# Patient Record
Sex: Female | Born: 1950 | Hispanic: No | Marital: Married | Smoking: Former smoker
Health system: Southern US, Community
[De-identification: ages and names within clinical notes are randomized; demographics above are authoritative.]

## PROBLEM LIST (undated history)

## (undated) DIAGNOSIS — E119 Type 2 diabetes mellitus without complications: Secondary | ICD-10-CM

## (undated) DIAGNOSIS — G43909 Migraine, unspecified, not intractable, without status migrainosus: Secondary | ICD-10-CM

## (undated) DIAGNOSIS — M12811 Other specific arthropathies, not elsewhere classified, right shoulder: Secondary | ICD-10-CM

## (undated) DIAGNOSIS — Z87442 Personal history of urinary calculi: Secondary | ICD-10-CM

## (undated) DIAGNOSIS — T8859XA Other complications of anesthesia, initial encounter: Secondary | ICD-10-CM

## (undated) DIAGNOSIS — T7840XA Allergy, unspecified, initial encounter: Secondary | ICD-10-CM

## (undated) DIAGNOSIS — E785 Hyperlipidemia, unspecified: Secondary | ICD-10-CM

## (undated) DIAGNOSIS — E559 Vitamin D deficiency, unspecified: Secondary | ICD-10-CM

## (undated) DIAGNOSIS — I10 Essential (primary) hypertension: Secondary | ICD-10-CM

## (undated) DIAGNOSIS — F329 Major depressive disorder, single episode, unspecified: Secondary | ICD-10-CM

## (undated) DIAGNOSIS — K219 Gastro-esophageal reflux disease without esophagitis: Secondary | ICD-10-CM

## (undated) DIAGNOSIS — G629 Polyneuropathy, unspecified: Secondary | ICD-10-CM

## (undated) DIAGNOSIS — G4733 Obstructive sleep apnea (adult) (pediatric): Secondary | ICD-10-CM

## (undated) DIAGNOSIS — Z8489 Family history of other specified conditions: Secondary | ICD-10-CM

## (undated) DIAGNOSIS — I119 Hypertensive heart disease without heart failure: Secondary | ICD-10-CM

## (undated) DIAGNOSIS — I6523 Occlusion and stenosis of bilateral carotid arteries: Secondary | ICD-10-CM

## (undated) DIAGNOSIS — I639 Cerebral infarction, unspecified: Secondary | ICD-10-CM

## (undated) DIAGNOSIS — C801 Malignant (primary) neoplasm, unspecified: Secondary | ICD-10-CM

## (undated) DIAGNOSIS — F32A Depression, unspecified: Secondary | ICD-10-CM

## (undated) DIAGNOSIS — M1990 Unspecified osteoarthritis, unspecified site: Secondary | ICD-10-CM

## (undated) DIAGNOSIS — D751 Secondary polycythemia: Secondary | ICD-10-CM

## (undated) DIAGNOSIS — E1140 Type 2 diabetes mellitus with diabetic neuropathy, unspecified: Secondary | ICD-10-CM

## (undated) HISTORY — DX: Major depressive disorder, single episode, unspecified: F32.9

## (undated) HISTORY — DX: Hyperlipidemia, unspecified: E78.5

## (undated) HISTORY — PX: TUMOR REMOVAL: SHX12

## (undated) HISTORY — DX: Gastro-esophageal reflux disease without esophagitis: K21.9

## (undated) HISTORY — DX: Essential (primary) hypertension: I10

## (undated) HISTORY — DX: Allergy, unspecified, initial encounter: T78.40XA

## (undated) HISTORY — DX: Type 2 diabetes mellitus without complications: E11.9

## (undated) HISTORY — DX: Depression, unspecified: F32.A

## (undated) HISTORY — DX: Polyneuropathy, unspecified: G62.9

---
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
## 1984-09-19 HISTORY — PX: CYSTOSTOMY: SHX155

## 2000-02-23 HISTORY — PX: VAGINAL HYSTERECTOMY: SUR661

## 2000-04-05 ENCOUNTER — Encounter: Payer: Self-pay | Admitting: Neurosurgery

## 2000-04-05 ENCOUNTER — Ambulatory Visit (HOSPITAL_COMMUNITY): Admission: RE | Admit: 2000-04-05 | Discharge: 2000-04-05 | Payer: Self-pay | Admitting: Neurosurgery

## 2000-10-20 ENCOUNTER — Ambulatory Visit (HOSPITAL_COMMUNITY): Admission: RE | Admit: 2000-10-20 | Discharge: 2000-10-20 | Payer: Self-pay | Admitting: Neurosurgery

## 2000-10-20 ENCOUNTER — Encounter: Payer: Self-pay | Admitting: Neurosurgery

## 2000-12-05 ENCOUNTER — Encounter: Payer: Self-pay | Admitting: Neurosurgery

## 2000-12-07 HISTORY — PX: CERVICAL FUSION: SHX112

## 2000-12-08 ENCOUNTER — Encounter: Payer: Self-pay | Admitting: Neurosurgery

## 2000-12-09 ENCOUNTER — Inpatient Hospital Stay (HOSPITAL_COMMUNITY): Admission: RE | Admit: 2000-12-09 | Discharge: 2000-12-12 | Payer: Self-pay | Admitting: Neurosurgery

## 2000-12-28 ENCOUNTER — Ambulatory Visit (HOSPITAL_COMMUNITY): Admission: RE | Admit: 2000-12-28 | Discharge: 2000-12-28 | Payer: Self-pay | Admitting: Neurosurgery

## 2000-12-28 ENCOUNTER — Encounter: Payer: Self-pay | Admitting: Neurosurgery

## 2001-01-09 ENCOUNTER — Ambulatory Visit (HOSPITAL_COMMUNITY): Admission: RE | Admit: 2001-01-09 | Discharge: 2001-01-09 | Payer: Self-pay | Admitting: Neurosurgery

## 2001-01-09 ENCOUNTER — Encounter: Payer: Self-pay | Admitting: Neurosurgery

## 2001-03-22 HISTORY — PX: TONSILECTOMY, ADENOIDECTOMY, BILATERAL MYRINGOTOMY AND TUBES: SHX2538

## 2004-02-25 ENCOUNTER — Other Ambulatory Visit: Payer: Self-pay

## 2004-02-25 ENCOUNTER — Emergency Department: Payer: Self-pay | Admitting: Internal Medicine

## 2004-03-03 ENCOUNTER — Ambulatory Visit: Payer: Self-pay | Admitting: Internal Medicine

## 2004-03-22 HISTORY — PX: COLONOSCOPY: SHX5424

## 2006-02-15 ENCOUNTER — Ambulatory Visit: Payer: Self-pay | Admitting: Family Medicine

## 2006-03-01 ENCOUNTER — Ambulatory Visit: Payer: Self-pay | Admitting: Family Medicine

## 2006-09-19 ENCOUNTER — Ambulatory Visit: Payer: Self-pay | Admitting: Family Medicine

## 2006-11-15 ENCOUNTER — Ambulatory Visit: Payer: Self-pay | Admitting: General Practice

## 2006-11-16 ENCOUNTER — Ambulatory Visit: Payer: Self-pay

## 2006-11-21 HISTORY — PX: SHOULDER SURGERY: SHX246

## 2007-03-10 ENCOUNTER — Ambulatory Visit: Payer: Self-pay | Admitting: Family Medicine

## 2007-04-14 ENCOUNTER — Ambulatory Visit: Payer: Self-pay | Admitting: Internal Medicine

## 2007-04-14 ENCOUNTER — Emergency Department: Payer: Self-pay | Admitting: Emergency Medicine

## 2007-04-16 HISTORY — PX: SUBDURAL HEMATOMA EVACUATION VIA CRANIOTOMY: SUR319

## 2007-04-19 ENCOUNTER — Ambulatory Visit: Payer: Self-pay | Admitting: Family Medicine

## 2009-01-02 ENCOUNTER — Ambulatory Visit: Payer: Self-pay | Admitting: Family Medicine

## 2009-08-20 ENCOUNTER — Ambulatory Visit: Payer: Self-pay | Admitting: Family Medicine

## 2010-06-15 ENCOUNTER — Ambulatory Visit: Payer: Self-pay | Admitting: Family Medicine

## 2012-04-07 ENCOUNTER — Ambulatory Visit: Payer: Self-pay | Admitting: Family Medicine

## 2012-05-08 DIAGNOSIS — N23 Unspecified renal colic: Secondary | ICD-10-CM | POA: Insufficient documentation

## 2012-05-08 DIAGNOSIS — N3946 Mixed incontinence: Secondary | ICD-10-CM | POA: Insufficient documentation

## 2012-05-08 DIAGNOSIS — R310 Gross hematuria: Secondary | ICD-10-CM

## 2012-05-08 HISTORY — DX: Gross hematuria: R31.0

## 2012-05-15 ENCOUNTER — Ambulatory Visit: Payer: Self-pay | Admitting: Urology

## 2012-05-15 LAB — CREATININE, SERUM
Creatinine: 0.89 mg/dL (ref 0.60–1.30)
EGFR (African American): >60
EGFR (Non-African Amer.): >60

## 2012-09-11 DIAGNOSIS — N3941 Urge incontinence: Secondary | ICD-10-CM | POA: Insufficient documentation

## 2012-09-11 DIAGNOSIS — N281 Cyst of kidney, acquired: Secondary | ICD-10-CM | POA: Insufficient documentation

## 2012-09-11 DIAGNOSIS — N393 Stress incontinence (female) (male): Secondary | ICD-10-CM | POA: Insufficient documentation

## 2013-08-09 DIAGNOSIS — K219 Gastro-esophageal reflux disease without esophagitis: Secondary | ICD-10-CM | POA: Insufficient documentation

## 2013-08-09 DIAGNOSIS — E119 Type 2 diabetes mellitus without complications: Secondary | ICD-10-CM | POA: Insufficient documentation

## 2013-08-09 DIAGNOSIS — E785 Hyperlipidemia, unspecified: Secondary | ICD-10-CM | POA: Insufficient documentation

## 2013-08-09 DIAGNOSIS — I10 Essential (primary) hypertension: Secondary | ICD-10-CM | POA: Insufficient documentation

## 2014-02-05 DIAGNOSIS — R339 Retention of urine, unspecified: Secondary | ICD-10-CM | POA: Insufficient documentation

## 2014-12-16 ENCOUNTER — Encounter: Payer: Self-pay | Admitting: Family Medicine

## 2014-12-16 ENCOUNTER — Ambulatory Visit (INDEPENDENT_AMBULATORY_CARE_PROVIDER_SITE_OTHER): Payer: Medicare Other | Admitting: Family Medicine

## 2014-12-16 VITALS — BP 120/80 | HR 80 | Ht 65.0 in | Wt 225.0 lb

## 2014-12-16 DIAGNOSIS — E785 Hyperlipidemia, unspecified: Secondary | ICD-10-CM | POA: Diagnosis not present

## 2014-12-16 DIAGNOSIS — R69 Illness, unspecified: Secondary | ICD-10-CM

## 2014-12-16 DIAGNOSIS — K219 Gastro-esophageal reflux disease without esophagitis: Secondary | ICD-10-CM

## 2014-12-16 MED ORDER — ESOMEPRAZOLE MAGNESIUM 40 MG PO CPDR: 40.0000 mg | DELAYED_RELEASE_CAPSULE | Freq: Every day | ORAL | Status: DC

## 2014-12-16 MED ORDER — ROSUVASTATIN CALCIUM 20 MG PO TABS: 20.0000 mg | ORAL_TABLET | Freq: Every day | ORAL | Status: DC

## 2014-12-16 NOTE — Progress Notes (Signed)
Name: Tracey Russo   MRN: [id number]    DOB: [birth year]   Date:12/16/2014       Progress Note  Subjective  Chief Complaint  Chief Complaint  Patient presents with  . Hyperlipidemia  . Gastrophageal Reflux    Hyperlipidemia This is a recurrent problem. The current episode started more than 1 year ago. The problem is controlled. Recent lipid tests were reviewed and are low. She has no history of chronic renal disease, diabetes, hypothyroidism, liver disease, obesity or nephrotic syndrome. Factors aggravating her hyperlipidemia include beta blockers and smoking. Pertinent negatives include no chest pain, focal sensory loss, focal weakness, leg pain, myalgias or shortness of breath. Current antihyperlipidemic treatment includes statins. The current treatment provides mild improvement of lipids. There are no compliance problems.  Risk factors for coronary artery disease include dyslipidemia, hypertension, stress, obesity and post-menopausal.  Gastrophageal Reflux She reports no abdominal pain, no belching, no chest pain, no choking, no coughing, no dysphagia, no heartburn, no hoarse voice, no nausea, no sore throat or no wheezing. This is a chronic problem. The current episode started more than 1 year ago. The problem occurs frequently. The problem has been gradually improving. Pertinent negatives include no anemia, fatigue, melena, muscle weakness, orthopnea or weight loss. She has tried a PPI for the symptoms. The treatment provided no relief. Past procedures do not include an abdominal ultrasound, an EGD or a UGI.    No problem-specific assessment & plan notes found for this encounter.   Past Medical History  Diagnosis Date  . Diabetes mellitus without complication   . Depression   . Neuropathy   . Hypertension   . GERD (gastroesophageal reflux disease)   . Allergy     Past Surgical History  Procedure Laterality Date  . Cystostomy  09/1984  . Vaginal hysterectomy  02/23/2000  .  Cervical fusion  12/07/2000  . Tonsilectomy, adenoidectomy, bilateral myringotomy and tubes  03/2001  . Shoulder surgery Right 11/2006    tumor removed  . Subdural hematoma evacuation via craniotomy  04/16/2007  . Colonoscopy  2006    Dr [name]    History reviewed. No pertinent family history.  Social History   Social History  . Marital Status: Married    Spouse Name: N/A  . Number of Children: N/A  . Years of Education: N/A   Occupational History  . Not on file.   Social History Main Topics  . Smoking status: Current Every Day Smoker  . Smokeless tobacco: Not on file  . Alcohol Use: 0.0 oz/week    0 Standard drinks or equivalent per week  . Drug Use: No  . Sexual Activity: Yes   Other Topics Concern  . Not on file   Social History Narrative  . No narrative on file    Allergies  Allergen Reactions  . Ace Inhibitors Cough  . Corticosteroids     Other reaction(s): OTHER  . Nsaids     Other reaction(s): ANAPHYLAXIS     Review of Systems  Constitutional: Negative for fever, chills, weight loss, malaise/fatigue and fatigue.  HENT: Negative for ear discharge, ear pain, hoarse voice and sore throat.   Eyes: Negative for blurred vision.  Respiratory: Negative for cough, sputum production, choking, shortness of breath and wheezing.   Cardiovascular: Negative for chest pain, palpitations and leg swelling.  Gastrointestinal: Negative for heartburn, dysphagia, nausea, abdominal pain, diarrhea, constipation, blood in stool and melena.  Genitourinary: Negative for dysuria, urgency, frequency and hematuria.  Musculoskeletal: Negative  for myalgias, back pain, joint pain, muscle weakness and neck pain.  Skin: Negative for rash.  Neurological: Negative for dizziness, tingling, sensory change, focal weakness and headaches.  Endo/Heme/Allergies: Negative for environmental allergies and polydipsia. Does not bruise/bleed easily.  Psychiatric/Behavioral: Negative for depression  and suicidal ideas. The patient is not nervous/anxious and does not have insomnia.      Objective  Filed Vitals:   12/16/14 1337  BP: 120/80  Pulse: 80  Height: 5\' 5"  (1.651 m)  Weight: 225 lb (102.059 kg)    Physical Exam  Constitutional: She is well-developed, well-nourished, and in no distress. No distress.  HENT:  Head: Normocephalic and atraumatic.  Right Ear: External ear normal.  Left Ear: External ear normal.  Nose: Nose normal.  Mouth/Throat: Oropharynx is clear and moist.  Eyes: Conjunctivae and EOM are normal. Pupils are equal, round, and reactive to light. Right eye exhibits no discharge. Left eye exhibits no discharge.  Neck: Normal range of motion. Neck supple. No JVD present. No thyromegaly present.  Cardiovascular: Normal rate, regular rhythm, normal heart sounds and intact distal pulses.  Exam reveals no gallop and no friction rub.   No murmur heard. Pulmonary/Chest: Effort normal and breath sounds normal.  Abdominal: Soft. Bowel sounds are normal. She exhibits no mass. There is no tenderness. There is no guarding.  Musculoskeletal: Normal range of motion. She exhibits no edema.  Lymphadenopathy:    She has no cervical adenopathy.  Neurological: She is alert. She has normal reflexes.  Skin: Skin is warm and dry. She is not diaphoretic.  Psychiatric: Mood and affect normal.      Assessment & Plan  Problem List Items Addressed This Visit      Digestive   Acid reflux - Primary   Relevant Medications   esomeprazole (NEXIUM) 40 MG capsule     Other   Dyslipidemia   Relevant Medications   rosuvastatin (CRESTOR) 20 MG tablet    Other Visit Diagnoses    Taking multiple medications for chronic disease        Relevant Orders    EKG 12-Lead (Completed)         Dr. [facility]  12/16/2014

## 2014-12-19 ENCOUNTER — Other Ambulatory Visit: Payer: Self-pay | Admitting: Family Medicine

## 2014-12-19 DIAGNOSIS — J302 Other seasonal allergic rhinitis: Secondary | ICD-10-CM

## 2015-01-22 ENCOUNTER — Other Ambulatory Visit: Payer: Self-pay | Admitting: Family Medicine

## 2015-02-05 ENCOUNTER — Other Ambulatory Visit: Payer: Self-pay

## 2015-02-05 ENCOUNTER — Other Ambulatory Visit: Payer: Self-pay | Admitting: Family Medicine

## 2015-02-07 ENCOUNTER — Other Ambulatory Visit: Payer: Self-pay

## 2015-02-20 ENCOUNTER — Other Ambulatory Visit: Payer: Self-pay | Admitting: Family Medicine

## 2015-03-13 LAB — MICROALBUMIN, URINE: Microalb, Ur: 187

## 2015-04-11 ENCOUNTER — Other Ambulatory Visit: Payer: Self-pay | Admitting: Family Medicine

## 2015-05-05 ENCOUNTER — Ambulatory Visit (INDEPENDENT_AMBULATORY_CARE_PROVIDER_SITE_OTHER): Payer: Medicare Other | Admitting: Family Medicine

## 2015-05-05 ENCOUNTER — Encounter: Payer: Self-pay | Admitting: Family Medicine

## 2015-05-05 VITALS — BP 120/80 | HR 80 | Ht 65.0 in | Wt 222.0 lb

## 2015-05-05 DIAGNOSIS — B3749 Other urogenital candidiasis: Secondary | ICD-10-CM

## 2015-05-05 DIAGNOSIS — B958 Unspecified staphylococcus as the cause of diseases classified elsewhere: Secondary | ICD-10-CM | POA: Diagnosis not present

## 2015-05-05 DIAGNOSIS — L089 Local infection of the skin and subcutaneous tissue, unspecified: Secondary | ICD-10-CM | POA: Diagnosis not present

## 2015-05-05 MED ORDER — SULFAMETHOXAZOLE-TRIMETHOPRIM 800-160 MG PO TABS: 1.0000 | ORAL_TABLET | Freq: Two times a day (BID) | ORAL | Status: DC

## 2015-05-05 MED ORDER — FLUCONAZOLE 150 MG PO TABS: 150.0000 mg | ORAL_TABLET | Freq: Once | ORAL | Status: DC

## 2015-05-05 MED ORDER — MUPIROCIN CALCIUM 2 % EX CREA: 1.0000 "application " | TOPICAL_CREAM | Freq: Two times a day (BID) | CUTANEOUS | Status: DC

## 2015-05-05 NOTE — Progress Notes (Signed)
Name: Tracey Russo   MRN: [id number]    DOB: [birth year]   Date:05/05/2015       Progress Note  Subjective  Chief Complaint  Chief Complaint  Patient presents with  . Rash    fell on abdomen- red, irritated, oozing    Rash This is a new problem. The current episode started in the past 7 days. The problem has been gradually worsening since onset. The affected locations include the torso. Pertinent negatives include no anorexia, congestion, cough, diarrhea, eye pain, facial edema, fatigue, fever, joint pain, nail changes, rhinorrhea, shortness of breath, sore throat or vomiting.    No problem-specific assessment & plan notes found for this encounter.   Past Medical History  Diagnosis Date  . Diabetes mellitus without complication ([place])   . Depression   . Neuropathy ([place])   . Hypertension   . GERD (gastroesophageal reflux disease)   . Allergy     Past Surgical History  Procedure Laterality Date  . Cystostomy  09/1984  . Vaginal hysterectomy  02/23/2000  . Cervical fusion  12/07/2000  . Tonsilectomy, adenoidectomy, bilateral myringotomy and tubes  03/2001  . Shoulder surgery Right 11/2006    tumor removed  . Subdural hematoma evacuation via craniotomy  04/16/2007  . Colonoscopy  2006    Dr [name]    History reviewed. No pertinent family history.  Social History   Social History  . Marital Status: Married    Spouse Name: N/A  . Number of Children: N/A  . Years of Education: N/A   Occupational History  . Not on file.   Social History Main Topics  . Smoking status: Current Every Day Smoker  . Smokeless tobacco: Not on file  . Alcohol Use: 0.0 oz/week    0 Standard drinks or equivalent per week  . Drug Use: No  . Sexual Activity: Yes   Other Topics Concern  . Not on file   Social History Narrative    Allergies  Allergen Reactions  . Ace Inhibitors Cough  . Corticosteroids     Other reaction(s): OTHER  . Nsaids     Other reaction(s): ANAPHYLAXIS      Review of Systems  Constitutional: Negative for fever, chills, weight loss, malaise/fatigue and fatigue.  HENT: Negative for congestion, ear discharge, ear pain, rhinorrhea and sore throat.   Eyes: Negative for blurred vision and pain.  Respiratory: Negative for cough, sputum production, shortness of breath and wheezing.   Cardiovascular: Negative for chest pain, palpitations and leg swelling.  Gastrointestinal: Negative for heartburn, nausea, vomiting, abdominal pain, diarrhea, constipation, blood in stool, melena and anorexia.  Genitourinary: Negative for dysuria, urgency, frequency and hematuria.  Musculoskeletal: Negative for myalgias, back pain, joint pain and neck pain.  Skin: Positive for rash. Negative for nail changes.  Neurological: Negative for dizziness, tingling, sensory change, focal weakness and headaches.  Endo/Heme/Allergies: Negative for environmental allergies and polydipsia. Does not bruise/bleed easily.  Psychiatric/Behavioral: Negative for depression and suicidal ideas. The patient is not nervous/anxious and does not have insomnia.      Objective  Filed Vitals:   05/05/15 1604  BP: 120/80  Pulse: 80  Height: 5\' 5"  (1.651 m)  Weight: 222 lb (100.699 kg)    Physical Exam  Constitutional: She is well-developed, well-nourished, and in no distress. No distress.  HENT:  Head: Normocephalic and atraumatic.  Right Ear: External ear normal.  Left Ear: External ear normal.  Nose: Nose normal.  Mouth/Throat: Oropharynx is clear and moist.  Eyes: Conjunctivae and EOM are normal. Pupils are equal, round, and reactive to light. Right eye exhibits no discharge. Left eye exhibits no discharge.  Neck: Normal range of motion. Neck supple. No JVD present. No thyromegaly present.  Cardiovascular: Normal rate, regular rhythm, normal heart sounds and intact distal pulses.  Exam reveals no gallop and no friction rub.   No murmur heard. Pulmonary/Chest: Effort normal and  breath sounds normal.  Abdominal: Soft. Bowel sounds are normal. She exhibits no mass. There is no tenderness. There is no guarding.  Musculoskeletal: Normal range of motion. She exhibits no edema.  Lymphadenopathy:    She has no cervical adenopathy.  Neurological: She is alert. She has normal reflexes.  Skin: Skin is warm and dry. Rash noted. She is not diaphoretic. There is erythema.  Psychiatric: Mood and affect normal.  Nursing note and vitals reviewed.     Assessment & Plan  Problem List Items Addressed This Visit    None    Visit Diagnoses    Staph skin infection    -  Primary    Relevant Medications    sulfamethoxazole-trimethoprim (BACTRIM DS,SEPTRA DS) 800-160 MG tablet    mupirocin cream (BACTROBAN) 2 %    fluconazole (DIFLUCAN) 150 MG tablet    Candidiasis of perineum        Relevant Medications    sulfamethoxazole-trimethoprim (BACTRIM DS,SEPTRA DS) 800-160 MG tablet    mupirocin cream (BACTROBAN) 2 %    fluconazole (DIFLUCAN) 150 MG tablet         Dr. Chaniyah Jahr [facility]  05/05/2015

## 2015-05-05 NOTE — Patient Instructions (Signed)
Wound Care °Taking care of your wound properly can help to prevent pain and infection. It can also help your wound to heal more quickly.  °HOW TO CARE FOR YOUR WOUND  °· Take or apply over-the-counter and prescription medicines only as told by your health care provider. °· If you were prescribed antibiotic medicine, take or apply it as told by your health care provider. Do not stop using the antibiotic even if your condition improves. °· Clean the wound each day or as told by your health care provider. °¨ Wash the wound with mild soap and water. °¨ Rinse the wound with water to remove all soap. °¨ Pat the wound dry with a clean towel. Do not rub it. °· There are many different ways to close and cover a wound. For example, a wound can be covered with stitches (sutures), skin glue, or adhesive strips. Follow instructions from your health care provider about: °¨ How to take care of your wound. °¨ When and how you should change your bandage (dressing). °¨ When you should remove your dressing. °¨ Removing whatever was used to close your wound. °· Check your wound every day for signs of infection. Watch for: °¨ Redness, swelling, or pain. °¨ Fluid, blood, or pus. °· Keep the dressing dry until your health care provider says it can be removed. Do not take baths, swim, use a hot tub, or do anything that would put your wound underwater until your health care provider approves. °· Raise (elevate) the injured area above the level of your heart while you are sitting or lying down. °· Do not scratch or pick at the wound. °· Keep all follow-up visits as told by your health care provider. This is important. °SEEK MEDICAL CARE IF: °· You received a tetanus shot and you have swelling, severe pain, redness, or bleeding at the injection site. °· You have a fever. °· Your pain is not controlled with medicine. °· You have increased redness, swelling, or pain at the site of your wound. °· You have fluid, blood, or pus coming from your  wound. °· You notice a bad smell coming from your wound or your dressing. °SEEK IMMEDIATE MEDICAL CARE IF: °· You have a red streak going away from your wound. °  °This information is not intended to replace advice given to you by your health care provider. Make sure you discuss any questions you have with your health care provider. °  °Document Released: 12/16/2007 Document Revised: 07/23/2014 Document Reviewed: 03/04/2014 °Elsevier Interactive Patient Education ©2016 [employer] ° °

## 2015-05-09 ENCOUNTER — Ambulatory Visit (INDEPENDENT_AMBULATORY_CARE_PROVIDER_SITE_OTHER): Payer: Medicare Other | Admitting: Family Medicine

## 2015-05-09 ENCOUNTER — Encounter: Payer: Self-pay | Admitting: Family Medicine

## 2015-05-09 VITALS — BP 120/70 | HR 68 | Ht 65.0 in | Wt 224.0 lb

## 2015-05-09 DIAGNOSIS — L03311 Cellulitis of abdominal wall: Secondary | ICD-10-CM | POA: Diagnosis not present

## 2015-05-09 DIAGNOSIS — B354 Tinea corporis: Secondary | ICD-10-CM

## 2015-05-09 DIAGNOSIS — Z23 Encounter for immunization: Secondary | ICD-10-CM | POA: Diagnosis not present

## 2015-05-09 NOTE — Progress Notes (Signed)
Name: Tracey Russo   MRN: [id number]    DOB: [birth year]   Date:05/09/2015       Progress Note  Subjective  Chief Complaint  Chief Complaint  Patient presents with  . Rash    "seems to have improved"    Rash This is a new problem. The current episode started in the past 7 days. The problem has been resolved since onset. The rash is characterized by itchiness. She was exposed to nothing. Pertinent negatives include no anorexia, congestion, cough, diarrhea, eye pain, facial edema, fatigue, fever, joint pain, nail changes, rhinorrhea, shortness of breath, sore throat or vomiting. Past treatments include nothing. The treatment provided mild relief.    No problem-specific assessment & plan notes found for this encounter.   Past Medical History  Diagnosis Date  . Diabetes mellitus without complication ([place])   . Depression   . Neuropathy ([place])   . Hypertension   . GERD (gastroesophageal reflux disease)   . Allergy     Past Surgical History  Procedure Laterality Date  . Cystostomy  09/1984  . Vaginal hysterectomy  02/23/2000  . Cervical fusion  12/07/2000  . Tonsilectomy, adenoidectomy, bilateral myringotomy and tubes  03/2001  . Shoulder surgery Right 11/2006    tumor removed  . Subdural hematoma evacuation via craniotomy  04/16/2007  . Colonoscopy  2006    Dr [name]    History reviewed. No pertinent family history.  Social History   Social History  . Marital Status: Married    Spouse Name: N/A  . Number of Children: N/A  . Years of Education: N/A   Occupational History  . Not on file.   Social History Main Topics  . Smoking status: Current Every Day Smoker  . Smokeless tobacco: Not on file  . Alcohol Use: 0.0 oz/week    0 Standard drinks or equivalent per week  . Drug Use: No  . Sexual Activity: Yes   Other Topics Concern  . Not on file   Social History Narrative    Allergies  Allergen Reactions  . Ace Inhibitors Cough  . Corticosteroids     Other  reaction(s): OTHER  . Nsaids     Other reaction(s): ANAPHYLAXIS     Review of Systems  Constitutional: Negative for fever, chills, weight loss, malaise/fatigue and fatigue.  HENT: Negative for congestion, ear discharge, ear pain, rhinorrhea and sore throat.   Eyes: Negative for blurred vision and pain.  Respiratory: Negative for cough, sputum production, shortness of breath and wheezing.   Cardiovascular: Negative for chest pain, palpitations and leg swelling.  Gastrointestinal: Negative for heartburn, nausea, vomiting, abdominal pain, diarrhea, constipation, blood in stool, melena and anorexia.  Genitourinary: Negative for dysuria, urgency, frequency and hematuria.  Musculoskeletal: Negative for myalgias, back pain, joint pain and neck pain.  Skin: Positive for rash. Negative for nail changes.  Neurological: Negative for dizziness, tingling, sensory change, focal weakness and headaches.  Endo/Heme/Allergies: Negative for environmental allergies and polydipsia. Does not bruise/bleed easily.  Psychiatric/Behavioral: Negative for depression and suicidal ideas. The patient is not nervous/anxious and does not have insomnia.      Objective  Filed Vitals:   05/09/15 1414  BP: 120/70  Pulse: 68  Height: 5\' 5"  (1.651 m)  Weight: 224 lb (101.606 kg)    Physical Exam  Skin: No abrasion and no rash noted. No erythema.  Nursing note and vitals reviewed.     Assessment & Plan  Problem List Items Addressed This Visit  None    Visit Diagnoses    Tinea corporis    -  Primary    Cellulitis of abdominal wall        resolved    Need for influenza vaccination        Relevant Orders    Flu Vaccine QUAD 36+ mos PF IM (Fluarix & Fluzone Quad PF) (Completed)         Dr. [facility]  05/09/2015

## 2015-05-21 LAB — HEMOGLOBIN A1C: Hemoglobin A1C: 7.8

## 2015-06-16 ENCOUNTER — Ambulatory Visit (INDEPENDENT_AMBULATORY_CARE_PROVIDER_SITE_OTHER): Payer: Medicare Other | Admitting: Family Medicine

## 2015-06-16 ENCOUNTER — Encounter: Payer: Self-pay | Admitting: Family Medicine

## 2015-06-16 VITALS — BP 118/80 | HR 68 | Ht 65.0 in | Wt 224.0 lb

## 2015-06-16 DIAGNOSIS — N3941 Urge incontinence: Secondary | ICD-10-CM

## 2015-06-16 DIAGNOSIS — E785 Hyperlipidemia, unspecified: Secondary | ICD-10-CM | POA: Diagnosis not present

## 2015-06-16 DIAGNOSIS — I679 Cerebrovascular disease, unspecified: Secondary | ICD-10-CM | POA: Diagnosis not present

## 2015-06-16 DIAGNOSIS — K219 Gastro-esophageal reflux disease without esophagitis: Secondary | ICD-10-CM

## 2015-06-16 DIAGNOSIS — I10 Essential (primary) hypertension: Secondary | ICD-10-CM

## 2015-06-16 MED ORDER — METOPROLOL SUCCINATE ER 50 MG PO TB24: 50.0000 mg | ORAL_TABLET | Freq: Every day | ORAL | Status: DC

## 2015-06-16 MED ORDER — ESOMEPRAZOLE MAGNESIUM 40 MG PO CPDR: 40.0000 mg | DELAYED_RELEASE_CAPSULE | Freq: Every day | ORAL | Status: DC

## 2015-06-16 MED ORDER — CLOPIDOGREL BISULFATE 75 MG PO TABS: 75.0000 mg | ORAL_TABLET | Freq: Every day | ORAL | Status: DC

## 2015-06-16 MED ORDER — AMLODIPINE BESYLATE 10 MG PO TABS: 10.0000 mg | ORAL_TABLET | Freq: Every day | ORAL | Status: DC

## 2015-06-16 MED ORDER — HYDROCHLOROTHIAZIDE 12.5 MG PO TABS: 12.5000 mg | ORAL_TABLET | Freq: Every day | ORAL | Status: DC

## 2015-06-16 MED ORDER — ROSUVASTATIN CALCIUM 20 MG PO TABS: 20.0000 mg | ORAL_TABLET | Freq: Every day | ORAL | Status: DC

## 2015-06-16 NOTE — Progress Notes (Signed)
Name: Tracey Russo   MRN: [id number]    DOB: 10-11-1950   Date:06/16/2015       Progress Note  Subjective  Chief Complaint  Chief Complaint  Patient presents with  . Hypertension  . Gastroesophageal Reflux  . Cerebrovascular Accident  . Allergic Rhinitis   . Hyperlipidemia    HPI Comments: Patient with polycythemia vera.  Hypertension This is a chronic problem. The current episode started more than 1 year ago. The problem has been gradually improving since onset. The problem is controlled. Pertinent negatives include no blurred vision, chest pain, headaches, malaise/fatigue, neck pain, palpitations or shortness of breath. There are no associated agents to hypertension. Risk factors for coronary artery disease include diabetes mellitus, dyslipidemia and smoking/tobacco exposure. Past treatments include beta blockers, calcium channel blockers and diuretics. Hypertensive end-organ damage includes CVA. There is no history of angina, kidney disease, CAD/MI, heart failure, left ventricular hypertrophy, PVD, renovascular disease or retinopathy. There is no history of chronic renal disease or a hypertension causing med.  Gastroesophageal Reflux She reports no abdominal pain, no chest pain, no coughing, no heartburn, no nausea, no sore throat or no wheezing. This is a new problem. The current episode started in the past 7 days. The problem has been waxing and waning (must take ppi). Pertinent negatives include no melena or weight loss. There are no known risk factors. She has tried a PPI for the symptoms. The treatment provided significant relief.  Cerebrovascular Accident This is a chronic problem. The current episode started more than 1 year ago. The problem has been waxing and waning. Pertinent negatives include no abdominal pain, chest pain, chills, coughing, fever, headaches, myalgias, nausea, neck pain, rash, sore throat, vertigo or visual change. She has tried acetaminophen and NSAIDs for the  symptoms. The treatment provided mild relief.  Hyperlipidemia This is a chronic problem. The current episode started more than 1 year ago. The problem is controlled. She has no history of chronic renal disease. Pertinent negatives include no chest pain, focal weakness, myalgias or shortness of breath. Current antihyperlipidemic treatment includes statins. The current treatment provides moderate improvement of lipids. There are no compliance problems.     No problem-specific assessment & plan notes found for this encounter.   Past Medical History  Diagnosis Date  . Diabetes mellitus without complication ([place])   . Depression   . Neuropathy ([place])   . Hypertension   . GERD (gastroesophageal reflux disease)   . Allergy     Past Surgical History  Procedure Laterality Date  . Cystostomy  09/1984  . Vaginal hysterectomy  02/23/2000  . Cervical fusion  12/07/2000  . Tonsilectomy, adenoidectomy, bilateral myringotomy and tubes  03/2001  . Shoulder surgery Right 11/2006    tumor removed  . Subdural hematoma evacuation via craniotomy  04/16/2007  . Colonoscopy  2006    Dr [name]    History reviewed. No pertinent family history.  Social History   Social History  . Marital Status: Married    Spouse Name: N/A  . Number of Children: N/A  . Years of Education: N/A   Occupational History  . Not on file.   Social History Main Topics  . Smoking status: Current Every Day Smoker  . Smokeless tobacco: Not on file  . Alcohol Use: 0.0 oz/week    0 Standard drinks or equivalent per week  . Drug Use: No  . Sexual Activity: Yes   Other Topics Concern  . Not on file   Social  History Narrative    Allergies  Allergen Reactions  . Ace Inhibitors Cough  . Corticosteroids     Other reaction(s): OTHER  . Nsaids     Other reaction(s): ANAPHYLAXIS     Review of Systems  Constitutional: Negative for fever, chills, weight loss and malaise/fatigue.  HENT: Negative for ear discharge,  ear pain and sore throat.   Eyes: Negative for blurred vision.  Respiratory: Negative for cough, sputum production, shortness of breath and wheezing.   Cardiovascular: Negative for chest pain, palpitations and leg swelling.  Gastrointestinal: Negative for heartburn, nausea, abdominal pain, diarrhea, constipation, blood in stool and melena.  Genitourinary: Negative for dysuria, urgency, frequency and hematuria.  Musculoskeletal: Negative for myalgias, back pain, joint pain and neck pain.  Skin: Negative for rash.  Neurological: Negative for dizziness, vertigo, tingling, sensory change, focal weakness and headaches.  Endo/Heme/Allergies: Negative for environmental allergies and polydipsia. Does not bruise/bleed easily.  Psychiatric/Behavioral: Negative for depression and suicidal ideas. The patient is not nervous/anxious and does not have insomnia.      Objective  Filed Vitals:   06/16/15 1408  BP: 118/80  Pulse: 68  Height: 5\' 5"  (1.651 m)  Weight: 224 lb (101.606 kg)    Physical Exam  Constitutional: She is well-developed, well-nourished, and in no distress. No distress.  HENT:  Head: Normocephalic and atraumatic.  Right Ear: External ear normal.  Left Ear: External ear normal.  Nose: Nose normal.  Mouth/Throat: Oropharynx is clear and moist.  Eyes: Conjunctivae and EOM are normal. Pupils are equal, round, and reactive to light. Right eye exhibits no discharge. Left eye exhibits no discharge.  Neck: Normal range of motion. Neck supple. No JVD present. No thyromegaly present.  Cardiovascular: Normal rate, regular rhythm, normal heart sounds and intact distal pulses.  Exam reveals no gallop and no friction rub.   No murmur heard. Pulmonary/Chest: Effort normal and breath sounds normal.  Abdominal: Soft. Bowel sounds are normal. She exhibits no mass. There is no tenderness. There is no guarding.  Musculoskeletal: Normal range of motion. She exhibits no edema.  Lymphadenopathy:     She has no cervical adenopathy.  Neurological: She is alert. She has normal reflexes.  Skin: Skin is warm and dry. She is not diaphoretic.  Psychiatric: Mood and affect normal.  Nursing note and vitals reviewed.     Assessment & Plan  Problem List Items Addressed This Visit      Cardiovascular and Mediastinum   BP (high blood pressure) - Primary   Relevant Medications   amLODipine (NORVASC) 10 MG tablet   hydrochlorothiazide (HYDRODIURIL) 12.5 MG tablet   metoprolol succinate (TOPROL-XL) 50 MG 24 hr tablet   rosuvastatin (CRESTOR) 20 MG tablet   Other Relevant Orders   Renal Function Panel     Digestive   Acid reflux   Relevant Medications   esomeprazole (NEXIUM) 40 MG capsule     Other   Dyslipidemia   Relevant Medications   rosuvastatin (CRESTOR) 20 MG tablet   Other Relevant Orders   Lipid Profile   Urge incontinence    Other Visit Diagnoses    Cerebrovascular disease        Relevant Medications    amLODipine (NORVASC) 10 MG tablet    clopidogrel (PLAVIX) 75 MG tablet    hydrochlorothiazide (HYDRODIURIL) 12.5 MG tablet    metoprolol succinate (TOPROL-XL) 50 MG 24 hr tablet    rosuvastatin (CRESTOR) 20 MG tablet         Dr. Tilda Burrow  [facility]  06/16/2015

## 2015-06-17 LAB — RENAL FUNCTION PANEL
ALBUMIN: 4.3 g/dL (ref 3.6–4.8)
BUN/Creatinine Ratio: 17 (ref 11–26)
BUN: 11 mg/dL (ref 8–27)
CALCIUM: 9.8 mg/dL (ref 8.7–10.3)
CHLORIDE: 98 mmol/L (ref 96–106)
CO2: 26 mmol/L (ref 18–29)
Creatinine, Ser: 0.64 mg/dL (ref 0.57–1.00)
GFR calc Af Amer: 108 mL/min/{1.73_m2} (ref >59)
GFR calc non Af Amer: 93 mL/min/{1.73_m2} (ref >59)
Glucose: 168 mg/dL — ABNORMAL HIGH (ref 65–99)
PHOSPHORUS: 3.3 mg/dL (ref 2.5–4.5)
POTASSIUM: 4.3 mmol/L (ref 3.5–5.2)
SODIUM: 141 mmol/L (ref 134–144)

## 2015-06-17 LAB — LIPID PANEL
CHOLESTEROL TOTAL: 133 mg/dL (ref 100–199)
Chol/HDL Ratio: 3.7 ratio units (ref 0.0–4.4)
HDL: 36 mg/dL — AB (ref >39)
LDL Calculated: 59 mg/dL (ref 0–99)
Triglycerides: 190 mg/dL — ABNORMAL HIGH (ref 0–149)
VLDL CHOLESTEROL CAL: 38 mg/dL (ref 5–40)

## 2015-07-07 ENCOUNTER — Other Ambulatory Visit: Payer: Self-pay | Admitting: Family Medicine

## 2015-07-28 ENCOUNTER — Ambulatory Visit (INDEPENDENT_AMBULATORY_CARE_PROVIDER_SITE_OTHER): Payer: Medicare Other | Admitting: Family Medicine

## 2015-07-28 ENCOUNTER — Encounter: Payer: Self-pay | Admitting: Family Medicine

## 2015-07-28 VITALS — BP 120/80 | HR 60 | Ht 65.0 in | Wt 220.0 lb

## 2015-07-28 DIAGNOSIS — J0100 Acute maxillary sinusitis, unspecified: Secondary | ICD-10-CM

## 2015-07-28 MED ORDER — AMOXICILLIN 500 MG PO CAPS: 500.0000 mg | ORAL_CAPSULE | Freq: Three times a day (TID) | ORAL | Status: DC

## 2015-07-28 NOTE — Progress Notes (Signed)
Name: Tracey Russo   MRN: [id number]    DOB: Jul 11, 1950   Date:07/28/2015       Progress Note  Subjective  Chief Complaint  Chief Complaint  Patient presents with  . Allergic Rhinitis     watery eyes, runny nose, cough, drainage    Sinusitis This is a new problem. The current episode started in the past 7 days. The problem has been waxing and waning since onset. There has been no fever. Associated symptoms include chills, congestion, coughing, ear pain, headaches, shortness of breath, sinus pressure and a sore throat. Pertinent negatives include no diaphoresis, hoarse voice, neck pain, sneezing or swollen glands. Past treatments include acetaminophen. The treatment provided mild relief.  Cough This is a new problem. The problem has been waxing and waning. The cough is non-productive. Associated symptoms include chills, ear pain, headaches, nasal congestion, postnasal drip, rhinorrhea, a sore throat and shortness of breath. Pertinent negatives include no chest pain, fever, heartburn, hemoptysis, myalgias, rash, sweats, weight loss or wheezing. She has tried nothing for the symptoms. The treatment provided mild relief. There is no history of environmental allergies.    No problem-specific assessment & plan notes found for this encounter.   Past Medical History  Diagnosis Date  . Diabetes mellitus without complication ([place])   . Depression   . Neuropathy ([place])   . Hypertension   . GERD (gastroesophageal reflux disease)   . Allergy     Past Surgical History  Procedure Laterality Date  . Cystostomy  09/1984  . Vaginal hysterectomy  02/23/2000  . Cervical fusion  12/07/2000  . Tonsilectomy, adenoidectomy, bilateral myringotomy and tubes  03/2001  . Shoulder surgery Right 11/2006    tumor removed  . Subdural hematoma evacuation via craniotomy  04/16/2007  . Colonoscopy  2006    Dr [name]    History reviewed. No pertinent family history.  Social History   Social History  .  Marital Status: Married    Spouse Name: N/A  . Number of Children: N/A  . Years of Education: N/A   Occupational History  . Not on file.   Social History Main Topics  . Smoking status: Current Every Day Smoker  . Smokeless tobacco: Not on file  . Alcohol Use: 0.0 oz/week    0 Standard drinks or equivalent per week  . Drug Use: No  . Sexual Activity: Yes   Other Topics Concern  . Not on file   Social History Narrative    Allergies  Allergen Reactions  . Ace Inhibitors Cough  . Corticosteroids     Other reaction(s): OTHER  . Nsaids     Other reaction(s): ANAPHYLAXIS     Review of Systems  Constitutional: Positive for chills. Negative for fever, weight loss, malaise/fatigue and diaphoresis.  HENT: Positive for congestion, ear pain, postnasal drip, rhinorrhea, sinus pressure and sore throat. Negative for ear discharge, hoarse voice and sneezing.   Eyes: Negative for blurred vision.  Respiratory: Positive for cough and shortness of breath. Negative for hemoptysis, sputum production and wheezing.   Cardiovascular: Negative for chest pain, palpitations and leg swelling.  Gastrointestinal: Negative for heartburn, nausea, abdominal pain, diarrhea, constipation, blood in stool and melena.  Genitourinary: Negative for dysuria, urgency, frequency and hematuria.  Musculoskeletal: Negative for myalgias, back pain, joint pain and neck pain.  Skin: Negative for rash.  Neurological: Positive for headaches. Negative for dizziness, tingling, sensory change and focal weakness.  Endo/Heme/Allergies: Negative for environmental allergies and polydipsia. Does not bruise/bleed  easily.  Psychiatric/Behavioral: Negative for depression and suicidal ideas. The patient is not nervous/anxious and does not have insomnia.      Objective  Filed Vitals:   07/28/15 1043  BP: 120/80  Pulse: 60  Height: 5\' 5"  (1.651 m)  Weight: 220 lb (99.791 kg)    Physical Exam  Constitutional: She is  well-developed, well-nourished, and in no distress. No distress.  HENT:  Head: Normocephalic and atraumatic.  Right Ear: External ear normal.  Left Ear: External ear normal.  Nose: Nose normal.  Mouth/Throat: Oropharynx is clear and moist.  Eyes: Conjunctivae and EOM are normal. Pupils are equal, round, and reactive to light. Right eye exhibits no discharge. Left eye exhibits no discharge.  Neck: Normal range of motion. Neck supple. No JVD present. No thyromegaly present.  Cardiovascular: Normal rate, regular rhythm, normal heart sounds and intact distal pulses.  Exam reveals no gallop and no friction rub.   No murmur heard. Pulmonary/Chest: Effort normal and breath sounds normal.  Abdominal: Soft. Bowel sounds are normal. She exhibits no mass. There is no tenderness. There is no guarding.  Musculoskeletal: Normal range of motion. She exhibits no edema.  Lymphadenopathy:    She has no cervical adenopathy.  Neurological: She is alert. She has normal reflexes.  Skin: Skin is warm and dry. She is not diaphoretic.  Psychiatric: Mood and affect normal.  Nursing note and vitals reviewed.     Assessment & Plan  Problem List Items Addressed This Visit    None    Visit Diagnoses    Acute maxillary sinusitis, recurrence not specified    -  Primary    Relevant Medications    amoxicillin (AMOXIL) 500 MG capsule         Dr. [facility]  07/28/2015

## 2015-08-21 ENCOUNTER — Other Ambulatory Visit: Payer: Self-pay | Admitting: Family Medicine

## 2015-09-26 ENCOUNTER — Other Ambulatory Visit: Payer: Self-pay | Admitting: Family Medicine

## 2015-10-24 ENCOUNTER — Other Ambulatory Visit: Payer: Self-pay

## 2015-12-18 ENCOUNTER — Ambulatory Visit: Payer: Self-pay | Admitting: Family Medicine

## 2015-12-22 ENCOUNTER — Encounter: Payer: Self-pay | Admitting: Family Medicine

## 2015-12-22 ENCOUNTER — Ambulatory Visit (INDEPENDENT_AMBULATORY_CARE_PROVIDER_SITE_OTHER): Payer: Medicare Other | Admitting: Family Medicine

## 2015-12-22 VITALS — BP 120/80 | HR 90 | Ht 65.0 in | Wt 223.0 lb

## 2015-12-22 DIAGNOSIS — I10 Essential (primary) hypertension: Secondary | ICD-10-CM

## 2015-12-22 DIAGNOSIS — Z23 Encounter for immunization: Secondary | ICD-10-CM | POA: Diagnosis not present

## 2015-12-22 DIAGNOSIS — J0100 Acute maxillary sinusitis, unspecified: Secondary | ICD-10-CM | POA: Insufficient documentation

## 2015-12-22 DIAGNOSIS — K219 Gastro-esophageal reflux disease without esophagitis: Secondary | ICD-10-CM | POA: Diagnosis not present

## 2015-12-22 DIAGNOSIS — E782 Mixed hyperlipidemia: Secondary | ICD-10-CM | POA: Diagnosis not present

## 2015-12-22 DIAGNOSIS — F339 Major depressive disorder, recurrent, unspecified: Secondary | ICD-10-CM

## 2015-12-22 DIAGNOSIS — J302 Other seasonal allergic rhinitis: Secondary | ICD-10-CM | POA: Insufficient documentation

## 2015-12-22 DIAGNOSIS — E785 Hyperlipidemia, unspecified: Secondary | ICD-10-CM

## 2015-12-22 MED ORDER — HYDROCHLOROTHIAZIDE 12.5 MG PO CAPS: 12.5000 mg | ORAL_CAPSULE | Freq: Every day | ORAL | 6 refills | Status: DC

## 2015-12-22 MED ORDER — ROSUVASTATIN CALCIUM 20 MG PO TABS: 20.0000 mg | ORAL_TABLET | Freq: Every day | ORAL | 6 refills | Status: DC

## 2015-12-22 MED ORDER — ESOMEPRAZOLE MAGNESIUM 40 MG PO CPDR: 40.0000 mg | DELAYED_RELEASE_CAPSULE | Freq: Every day | ORAL | 6 refills | Status: DC

## 2015-12-22 MED ORDER — AMOXICILLIN-POT CLAVULANATE 875-125 MG PO TABS: 1.0000 | ORAL_TABLET | Freq: Two times a day (BID) | ORAL | 0 refills | Status: DC

## 2015-12-22 MED ORDER — AMLODIPINE BESYLATE 10 MG PO TABS: 10.0000 mg | ORAL_TABLET | Freq: Every day | ORAL | 6 refills | Status: DC

## 2015-12-22 MED ORDER — METOPROLOL SUCCINATE ER 50 MG PO TB24: 50.0000 mg | ORAL_TABLET | Freq: Every day | ORAL | 6 refills | Status: DC

## 2015-12-22 MED ORDER — LORATADINE 10 MG PO TABS: 10.0000 mg | ORAL_TABLET | Freq: Every day | ORAL | 6 refills | Status: DC

## 2015-12-22 NOTE — Progress Notes (Signed)
Name: Tracey Russo   MRN: [id number]    DOB: [birth year]   Date:12/22/2015       Progress Note  Subjective  Chief Complaint  Chief Complaint  Patient presents with  . Hyperlipidemia  . Hypertension  . Gastroesophageal Reflux  . Allergic Rhinitis   . Sinusitis    facial pressure, drainage, cough- clear production    Hyperlipidemia  This is a chronic problem. The current episode started more than 1 year ago. The problem is controlled. Recent lipid tests were reviewed and are normal. Exacerbating diseases include obesity. She has no history of chronic renal disease, diabetes, hypothyroidism, liver disease or nephrotic syndrome. There are no known factors aggravating her hyperlipidemia. Pertinent negatives include no chest pain, focal sensory loss, focal weakness, leg pain, myalgias or shortness of breath. She is currently on no antihyperlipidemic treatment. The current treatment provides moderate improvement of lipids. There are no compliance problems.  Risk factors for coronary artery disease include diabetes mellitus, dyslipidemia, hypertension, obesity and post-menopausal.  Hypertension  This is a chronic problem. The current episode started more than 1 year ago. The problem has been gradually improving since onset. The problem is controlled. Pertinent negatives include no anxiety, blurred vision, chest pain, headaches, malaise/fatigue, neck pain, orthopnea, palpitations, peripheral edema, PND, shortness of breath or sweats. There are no associated agents to hypertension. There are no known risk factors for coronary artery disease. Past treatments include beta blockers, calcium channel blockers and diuretics. The current treatment provides moderate improvement. There are no compliance problems.  There is no history of angina, kidney disease, CAD/MI, CVA, heart failure, left ventricular hypertrophy, PVD or retinopathy. There is no history of chronic renal disease.  Gastroesophageal Reflux  She  reports no abdominal pain, no belching, no chest pain, no choking, no coughing, no dysphagia, no early satiety, no globus sensation, no heartburn, no hoarse voice, no nausea, no sore throat, no stridor or no wheezing. This is a chronic problem. The current episode started more than 1 year ago. The problem occurs frequently. The problem has been waxing and waning. The symptoms are aggravated by smoking. Pertinent negatives include no anemia, fatigue, melena, muscle weakness or weight loss. Risk factors include smoking/tobacco exposure. She has tried a PPI for the symptoms. The treatment provided mild relief.  Sinusitis  This is a new problem. The current episode started more than 1 year ago. The problem has been waxing and waning since onset. There has been no fever. Pertinent negatives include no chills, congestion, coughing, diaphoresis, ear pain, headaches, hoarse voice, neck pain, shortness of breath, sinus pressure or sore throat. Past treatments include nothing. The treatment provided mild relief.  Depression         This is a chronic problem.  The current episode started more than 1 year ago.   The onset quality is gradual.   The problem has been gradually improving since onset.  Associated symptoms include no fatigue, does not have insomnia, no myalgias, no headaches and no suicidal ideas.  Past treatments include SSRIs - Selective serotonin reuptake inhibitors.  Compliance with treatment is good.  Previous treatment provided moderate relief.   Pertinent negatives include no hypothyroidism and no anxiety.   No problem-specific Assessment & Plan notes found for this encounter.   Past Medical History:  Diagnosis Date  . Allergy   . Depression   . Diabetes mellitus without complication ([place])   . GERD (gastroesophageal reflux disease)   . Hypertension   . Neuropathy ([place])  Past Surgical History:  Procedure Laterality Date  . CERVICAL FUSION  12/07/2000  . COLONOSCOPY  2006   Dr [name]   . CYSTOSTOMY  09/1984  . SHOULDER SURGERY Right 11/2006   tumor removed  . SUBDURAL HEMATOMA EVACUATION VIA CRANIOTOMY  04/16/2007  . TONSILECTOMY, ADENOIDECTOMY, BILATERAL MYRINGOTOMY AND TUBES  03/2001  . VAGINAL HYSTERECTOMY  02/23/2000    History reviewed. No pertinent family history.  Social History   Social History  . Marital status: Married    Spouse name: N/A  . Number of children: N/A  . Years of education: N/A   Occupational History  . Not on file.   Social History Main Topics  . Smoking status: Current Every Day Smoker  . Smokeless tobacco: Not on file  . Alcohol use 0.0 oz/week  . Drug use: No  . Sexual activity: Yes   Other Topics Concern  . Not on file   Social History Narrative  . No narrative on file    Allergies  Allergen Reactions  . Ace Inhibitors Cough  . Corticosteroids     Other reaction(s): OTHER  . Nsaids     Other reaction(s): ANAPHYLAXIS     Review of Systems  Constitutional: Negative for chills, diaphoresis, fatigue, fever, malaise/fatigue and weight loss.  HENT: Negative for congestion, ear discharge, ear pain, hoarse voice, sinus pressure and sore throat.   Eyes: Negative for blurred vision.  Respiratory: Negative for cough, sputum production, choking, shortness of breath and wheezing.   Cardiovascular: Negative for chest pain, palpitations, orthopnea, leg swelling and PND.  Gastrointestinal: Negative for abdominal pain, blood in stool, constipation, diarrhea, dysphagia, heartburn, melena and nausea.  Genitourinary: Negative for dysuria, frequency, hematuria and urgency.  Musculoskeletal: Negative for back pain, joint pain, myalgias, muscle weakness and neck pain.  Skin: Negative for rash.  Neurological: Negative for dizziness, tingling, sensory change, focal weakness and headaches.  Endo/Heme/Allergies: Negative for environmental allergies and polydipsia. Does not bruise/bleed easily.  Psychiatric/Behavioral: Negative for  depression and suicidal ideas. The patient is not nervous/anxious and does not have insomnia.      Objective  Vitals:   12/22/15 1419  BP: 120/80  Pulse: 90  Weight: 223 lb (101.2 kg)  Height: 5\' 5"  (1.651 m)    Physical Exam  Constitutional: She is well-developed, well-nourished, and in no distress. No distress.  HENT:  Head: Normocephalic and atraumatic.  Right Ear: External ear normal.  Left Ear: External ear normal.  Nose: Nose normal.  Mouth/Throat: Oropharynx is clear and moist.  Eyes: Conjunctivae and EOM are normal. Pupils are equal, round, and reactive to light. Right eye exhibits no discharge. Left eye exhibits no discharge.  Neck: Normal range of motion. Neck supple. No JVD present. No thyromegaly present.  Cardiovascular: Normal rate, regular rhythm, normal heart sounds and intact distal pulses.  Exam reveals no gallop and no friction rub.   No murmur heard. Pulmonary/Chest: Effort normal and breath sounds normal. She has no wheezes. She has no rales.  Abdominal: Soft. Bowel sounds are normal. She exhibits no distension and no mass. There is no tenderness. There is no rebound and no guarding.  Musculoskeletal: Normal range of motion. She exhibits no edema.  Lymphadenopathy:    She has no cervical adenopathy.  Neurological: She is alert. She has normal reflexes.  Skin: Skin is warm and dry. No rash noted. She is not diaphoretic. No erythema. No pallor.  Psychiatric: Mood and affect normal.  Nursing note and vitals reviewed.  Assessment & Plan  Problem List Items Addressed This Visit      Cardiovascular and Mediastinum   Essential hypertension - Primary   Relevant Medications   rosuvastatin (CRESTOR) 20 MG tablet   amLODipine (NORVASC) 10 MG tablet   metoprolol succinate (TOPROL-XL) 50 MG 24 hr tablet   hydrochlorothiazide (MICROZIDE) 12.5 MG capsule     Respiratory   Acute seasonal allergic rhinitis   Relevant Medications   loratadine (CLARITIN) 10  MG tablet   Acute maxillary sinusitis   Relevant Medications   loratadine (CLARITIN) 10 MG tablet   amoxicillin-clavulanate (AUGMENTIN) 875-125 MG tablet     Digestive   Gastroesophageal reflux disease   Relevant Medications   esomeprazole (NEXIUM) 40 MG capsule     Other   Dyslipidemia   Relevant Medications   rosuvastatin (CRESTOR) 20 MG tablet   Mixed hyperlipidemia   Relevant Medications   rosuvastatin (CRESTOR) 20 MG tablet   amLODipine (NORVASC) 10 MG tablet   metoprolol succinate (TOPROL-XL) 50 MG 24 hr tablet   hydrochlorothiazide (MICROZIDE) 12.5 MG capsule    Other Visit Diagnoses    Recurrent major depressive disorder, remission status unspecified ([place])       Immunization due       Relevant Orders   Flu Vaccine QUAD 36+ mos PF IM (Fluarix & Fluzone Quad PF) (Completed)     I spent 25 minutes with this patient, More than 50% of that time was spent in face to face education, counseling and care coordination. Reviewed labs 3/17  Dr. [facility]  12/22/15

## 2016-01-30 ENCOUNTER — Other Ambulatory Visit: Payer: Self-pay

## 2016-01-30 DIAGNOSIS — D469 Myelodysplastic syndrome, unspecified: Secondary | ICD-10-CM

## 2016-01-30 MED ORDER — BUDESONIDE-FORMOTEROL FUMARATE 160-4.5 MCG/ACT IN AERO: 2.0000 | INHALATION_SPRAY | Freq: Two times a day (BID) | RESPIRATORY_TRACT | 3 refills | Status: DC

## 2016-05-12 ENCOUNTER — Other Ambulatory Visit: Payer: Self-pay | Admitting: Family Medicine

## 2016-05-12 DIAGNOSIS — I679 Cerebrovascular disease, unspecified: Secondary | ICD-10-CM

## 2016-06-21 ENCOUNTER — Ambulatory Visit (INDEPENDENT_AMBULATORY_CARE_PROVIDER_SITE_OTHER): Payer: Medicare Other | Admitting: Family Medicine

## 2016-06-21 VITALS — BP 130/64 | HR 70 | Ht 65.0 in | Wt 226.0 lb

## 2016-06-21 DIAGNOSIS — J302 Other seasonal allergic rhinitis: Secondary | ICD-10-CM | POA: Diagnosis not present

## 2016-06-21 DIAGNOSIS — E785 Hyperlipidemia, unspecified: Secondary | ICD-10-CM | POA: Diagnosis not present

## 2016-06-21 DIAGNOSIS — R69 Illness, unspecified: Secondary | ICD-10-CM

## 2016-06-21 DIAGNOSIS — I10 Essential (primary) hypertension: Secondary | ICD-10-CM | POA: Diagnosis not present

## 2016-06-21 DIAGNOSIS — I679 Cerebrovascular disease, unspecified: Secondary | ICD-10-CM | POA: Diagnosis not present

## 2016-06-21 DIAGNOSIS — K219 Gastro-esophageal reflux disease without esophagitis: Secondary | ICD-10-CM

## 2016-06-21 MED ORDER — HYDROCHLOROTHIAZIDE 12.5 MG PO CAPS: 12.5000 mg | ORAL_CAPSULE | Freq: Every day | ORAL | 6 refills | Status: DC

## 2016-06-21 MED ORDER — CLOPIDOGREL BISULFATE 75 MG PO TABS: 75.0000 mg | ORAL_TABLET | Freq: Every day | ORAL | 6 refills | Status: DC

## 2016-06-21 MED ORDER — LORATADINE 10 MG PO TABS: 10.0000 mg | ORAL_TABLET | Freq: Every day | ORAL | 6 refills | Status: DC

## 2016-06-21 MED ORDER — ESOMEPRAZOLE MAGNESIUM 40 MG PO CPDR: 40.0000 mg | DELAYED_RELEASE_CAPSULE | Freq: Every day | ORAL | 6 refills | Status: DC

## 2016-06-21 MED ORDER — ROSUVASTATIN CALCIUM 20 MG PO TABS: 20.0000 mg | ORAL_TABLET | Freq: Every day | ORAL | 6 refills | Status: DC

## 2016-06-21 MED ORDER — AMLODIPINE BESYLATE 10 MG PO TABS: 10.0000 mg | ORAL_TABLET | Freq: Every day | ORAL | 6 refills | Status: DC

## 2016-06-21 MED ORDER — METOPROLOL SUCCINATE ER 50 MG PO TB24: 50.0000 mg | ORAL_TABLET | Freq: Every day | ORAL | 6 refills | Status: DC

## 2016-06-21 NOTE — Progress Notes (Signed)
Name: Tracey Russo   MRN: [id number]    DOB: 14-Jun-1950   Date:06/21/2016       Progress Note  Subjective  Chief Complaint  Chief Complaint  Patient presents with  . Hypertension  . Allergic Rhinitis   . Gastroesophageal Reflux  . Cerebrovascular Accident    takes plavix    Hypertension  This is a chronic problem. The current episode started more than 1 year ago. The problem has been waxing and waning since onset. The problem is controlled. Pertinent negatives include no anxiety, blurred vision, chest pain, headaches, malaise/fatigue, neck pain, orthopnea, palpitations, peripheral edema, PND, shortness of breath or sweats. There are no associated agents to hypertension. Risk factors for coronary artery disease include diabetes mellitus, dyslipidemia, smoking/tobacco exposure, stress, obesity and post-menopausal state. Past treatments include calcium channel blockers, diuretics and beta blockers. The current treatment provides moderate improvement. There are no compliance problems.  There is no history of angina, kidney disease, CAD/MI, CVA, heart failure, left ventricular hypertrophy, PVD or retinopathy. There is no history of chronic renal disease, a hypertension causing med or renovascular disease.  Gastroesophageal Reflux  She reports no abdominal pain, no chest pain, no choking, no coughing, no dysphagia, no early satiety, no heartburn, no hoarse voice, no nausea, no sore throat, no stridor or no wheezing. This is a new problem. The current episode started more than 1 year ago. The problem has been waxing and waning. The symptoms are aggravated by certain foods. Pertinent negatives include no fatigue, melena or weight loss. She has tried a PPI for the symptoms. The treatment provided moderate relief. Past procedures do not include an abdominal ultrasound, an EGD, esophageal manometry, esophageal pH monitoring, H. pylori antibody titer or a UGI.  Cerebrovascular Accident  This is a chronic  problem. The current episode started more than 1 year ago. The problem occurs intermittently. The problem has been waxing and waning. Associated symptoms include congestion. Pertinent negatives include no abdominal pain, anorexia, arthralgias, change in bowel habit, chest pain, chills, coughing, diaphoresis, fatigue, fever, headaches, joint swelling, myalgias, nausea, neck pain, rash, sore throat, vertigo, visual change, vomiting or weakness.  Neurologic Problem  The patient's pertinent negatives include no altered mental status, clumsiness, focal sensory loss, focal weakness, loss of balance, memory loss, near-syncope, slurred speech, syncope, visual change or weakness. The current episode started more than 1 year ago. The neurological problem developed gradually. The problem has been waxing and waning since onset. Pertinent negatives include no abdominal pain, auditory change, aura, back pain, bladder incontinence, bowel incontinence, chest pain, confusion, diaphoresis, dizziness, fatigue, fever, headaches, light-headedness, nausea, neck pain, palpitations, shortness of breath, vertigo or vomiting. Past treatments include aspirin (plavix). The treatment provided mild relief. Her past medical history is significant for a CVA. There is no history of a bleeding disorder, a clotting disorder, dementia, head trauma, liver disease, mood changes or seizures.  Sinus Problem  This is a chronic problem. The current episode started more than 1 year ago. The problem has been waxing and waning since onset. The fever has been present for less than 1 day. The pain is mild. Associated symptoms include congestion. Pertinent negatives include no chills, coughing, diaphoresis, ear pain, headaches, hoarse voice, neck pain, shortness of breath or sore throat. The treatment provided mild relief.    No problem-specific Assessment & Plan notes found for this encounter.   Past Medical History:  Diagnosis Date  . Allergy   .  Depression   . Diabetes mellitus  without complication ([place])   . GERD (gastroesophageal reflux disease)   . Hypertension   . Neuropathy [facility])     Past Surgical History:  Procedure Laterality Date  . CERVICAL FUSION  12/07/2000  . COLONOSCOPY  2006   Dr [name]  . CYSTOSTOMY  09/1984  . SHOULDER SURGERY Right 11/2006   tumor removed  . SUBDURAL HEMATOMA EVACUATION VIA CRANIOTOMY  04/16/2007  . TONSILECTOMY, ADENOIDECTOMY, BILATERAL MYRINGOTOMY AND TUBES  03/2001  . VAGINAL HYSTERECTOMY  02/23/2000    No family history on file.  Social History   Social History  . Marital status: Married    Spouse name: N/A  . Number of children: N/A  . Years of education: N/A   Occupational History  . Not on file.   Social History Main Topics  . Smoking status: Current Every Day Smoker  . Smokeless tobacco: Not on file  . Alcohol use 0.0 oz/week  . Drug use: No  . Sexual activity: Yes   Other Topics Concern  . Not on file   Social History Narrative  . No narrative on file    Allergies  Allergen Reactions  . Ace Inhibitors Cough  . Corticosteroids     Other reaction(s): OTHER  . Nsaids     Other reaction(s): ANAPHYLAXIS    Outpatient Medications Prior to Visit  Medication Sig Dispense Refill  . aspirin 81 MG tablet Take 81 mg by mouth daily.    . budesonide-formoterol (SYMBICORT) 160-4.5 MCG/ACT inhaler Inhale 2 puffs into the lungs 2 (two) times daily. 1 Inhaler 3  . citalopram (CELEXA) 40 MG tablet Take 40 mg by mouth daily. Dr [name]    . gabapentin (NEURONTIN) 300 MG capsule Take 1 capsule by mouth every 6 (six) hours as needed. Dr Eddie Dibbles    . insulin aspart (NOVOLOG) 100 UNIT/ML injection 16 Units daily. Dr Eddie Dibbles    . insulin glargine (LANTUS) 100 UNIT/ML injection Inject into the skin at bedtime. Sliding scale- Dr Eddie Dibbles    . Liraglutide (VICTOZA) 18 MG/3ML SOPN Inject 1.8 mg into the skin daily. Dr Eddie Dibbles    . metFORMIN (GLUCOPHAGE) 1000 MG tablet Take 1 tablet by mouth 2  (two) times daily. Dr Eddie Dibbles    . amLODipine (NORVASC) 10 MG tablet Take 1 tablet (10 mg total) by mouth daily. 30 tablet 6  . clopidogrel (PLAVIX) 75 MG tablet TAKE ONE TABLET BY MOUTH ONCE DAILY 30 tablet 2  . esomeprazole (NEXIUM) 40 MG capsule Take 1 capsule (40 mg total) by mouth daily. 30 capsule 6  . hydrochlorothiazide (MICROZIDE) 12.5 MG capsule Take 1 capsule (12.5 mg total) by mouth daily. 30 capsule 6  . loratadine (CLARITIN) 10 MG tablet Take 1 tablet (10 mg total) by mouth daily. 30 tablet 6  . metoprolol succinate (TOPROL-XL) 50 MG 24 hr tablet Take 1 tablet (50 mg total) by mouth daily. Take with or immediately following a meal. 30 tablet 6  . rosuvastatin (CRESTOR) 20 MG tablet Take 1 tablet (20 mg total) by mouth daily. 30 tablet 6  . mupirocin cream (BACTROBAN) 2 % Apply 1 application topically 2 (two) times daily. (Patient not taking: Reported on 06/21/2016) 30 g 1  . amoxicillin-clavulanate (AUGMENTIN) 875-125 MG tablet Take 1 tablet by mouth 2 (two) times daily. 20 tablet 0   No facility-administered medications prior to visit.     Review of Systems  Constitutional: Negative for chills, diaphoresis, fatigue, fever, malaise/fatigue and weight loss.  HENT: Positive for congestion and sinus  pain. Negative for ear discharge, ear pain, hoarse voice and sore throat.   Eyes: Positive for redness. Negative for blurred vision.  Respiratory: Negative for cough, sputum production, choking, shortness of breath and wheezing.   Cardiovascular: Negative for chest pain, palpitations, orthopnea, leg swelling, PND and near-syncope.  Gastrointestinal: Negative for abdominal pain, anorexia, blood in stool, bowel incontinence, change in bowel habit, constipation, diarrhea, dysphagia, heartburn, melena, nausea and vomiting.  Genitourinary: Negative for bladder incontinence, dysuria, frequency, hematuria and urgency.  Musculoskeletal: Negative for arthralgias, back pain, joint pain, joint swelling,  myalgias and neck pain.  Skin: Negative for rash.  Neurological: Negative for dizziness, vertigo, tingling, sensory change, focal weakness, syncope, weakness, light-headedness, headaches and loss of balance.  Endo/Heme/Allergies: Negative for environmental allergies and polydipsia. Does not bruise/bleed easily.  Psychiatric/Behavioral: Negative for confusion, depression, memory loss and suicidal ideas. The patient is not nervous/anxious and does not have insomnia.      Objective  Vitals:   06/21/16 1343  BP: 130/64  Pulse: 70  Weight: 226 lb (102.5 kg)  Height: 5\' 5"  (1.651 m)    Physical Exam  Constitutional: She is well-developed, well-nourished, and in no distress. No distress.  HENT:  Head: Normocephalic and atraumatic.  Right Ear: External ear normal.  Left Ear: External ear normal.  Nose: Nose normal.  Mouth/Throat: Oropharynx is clear and moist.  Eyes: Conjunctivae and EOM are normal. Pupils are equal, round, and reactive to light. Right eye exhibits no discharge. Left eye exhibits no discharge.  Neck: Normal range of motion. Neck supple. No JVD present. No thyromegaly present.  Cardiovascular: Normal rate, regular rhythm, normal heart sounds and intact distal pulses.  Exam reveals no gallop and no friction rub.   No murmur heard. Pulmonary/Chest: Effort normal and breath sounds normal. She has no wheezes. She has no rales.  Abdominal: Soft. Bowel sounds are normal. She exhibits no mass. There is no tenderness. There is no guarding.  Musculoskeletal: Normal range of motion. She exhibits no edema.  Lymphadenopathy:    She has no cervical adenopathy.  Neurological: She is alert. She has normal reflexes.  Skin: Skin is warm and dry. She is not diaphoretic.  Psychiatric: Mood and affect normal.  Nursing note and vitals reviewed.     Assessment & Plan  Problem List Items Addressed This Visit      Cardiovascular and Mediastinum   Essential hypertension   Relevant  Medications   rosuvastatin (CRESTOR) 20 MG tablet   metoprolol succinate (TOPROL-XL) 50 MG 24 hr tablet   hydrochlorothiazide (MICROZIDE) 12.5 MG capsule   amLODipine (NORVASC) 10 MG tablet   Other Relevant Orders   Renal function panel     Respiratory   Acute seasonal allergic rhinitis   Relevant Medications   loratadine (CLARITIN) 10 MG tablet     Digestive   Gastroesophageal reflux disease   Relevant Medications   esomeprazole (NEXIUM) 40 MG capsule     Other   Dyslipidemia   Relevant Medications   rosuvastatin (CRESTOR) 20 MG tablet   Other Relevant Orders   Lipid panel    Other Visit Diagnoses    Taking medication for chronic disease    -  Primary   Relevant Orders   Magnesium   EKG 12-Lead (Completed)   Cerebrovascular disease       Relevant Medications   clopidogrel (PLAVIX) 75 MG tablet   rosuvastatin (CRESTOR) 20 MG tablet   metoprolol succinate (TOPROL-XL) 50 MG 24 hr tablet   hydrochlorothiazide (  MICROZIDE) 12.5 MG capsule   amLODipine (NORVASC) 10 MG tablet      Meds ordered this encounter  Medications  . clopidogrel (PLAVIX) 75 MG tablet    Sig: Take 1 tablet (75 mg total) by mouth daily.    Dispense:  30 tablet    Refill:  6    Please consider 90 day supplies to promote better adherence  . loratadine (CLARITIN) 10 MG tablet    Sig: Take 1 tablet (10 mg total) by mouth daily.    Dispense:  30 tablet    Refill:  6  . rosuvastatin (CRESTOR) 20 MG tablet    Sig: Take 1 tablet (20 mg total) by mouth daily.    Dispense:  30 tablet    Refill:  6  . metoprolol succinate (TOPROL-XL) 50 MG 24 hr tablet    Sig: Take 1 tablet (50 mg total) by mouth daily. Take with or immediately following a meal.    Dispense:  30 tablet    Refill:  6  . hydrochlorothiazide (MICROZIDE) 12.5 MG capsule    Sig: Take 1 capsule (12.5 mg total) by mouth daily.    Dispense:  30 capsule    Refill:  6  . amLODipine (NORVASC) 10 MG tablet    Sig: Take 1 tablet (10 mg total) by  mouth daily.    Dispense:  30 tablet    Refill:  6  . esomeprazole (NEXIUM) 40 MG capsule    Sig: Take 1 capsule (40 mg total) by mouth daily.    Dispense:  30 capsule    Refill:  6   I spent 45 minutes with this patient, More than 50% of that time was spent in face to face education, counseling and care coordination.   Dr. Macon [facility]  06/21/16

## 2016-06-22 LAB — RENAL FUNCTION PANEL
ALBUMIN: 4.4 g/dL (ref 3.6–4.8)
BUN/Creatinine Ratio: 19 (ref 12–28)
BUN: 13 mg/dL (ref 8–27)
CALCIUM: 10 mg/dL (ref 8.7–10.3)
CO2: 26 mmol/L (ref 18–29)
CREATININE: 0.69 mg/dL (ref 0.57–1.00)
Chloride: 97 mmol/L (ref 96–106)
GFR calc Af Amer: 104 mL/min/{1.73_m2} (ref >59)
GFR, EST NON AFRICAN AMERICAN: 90 mL/min/{1.73_m2} (ref >59)
Glucose: 118 mg/dL — ABNORMAL HIGH (ref 65–99)
PHOSPHORUS: 3.6 mg/dL (ref 2.5–4.5)
POTASSIUM: 4.2 mmol/L (ref 3.5–5.2)
Sodium: 137 mmol/L (ref 134–144)

## 2016-06-22 LAB — LIPID PANEL
CHOLESTEROL TOTAL: 128 mg/dL (ref 100–199)
Chol/HDL Ratio: 3.3 ratio (ref 0.0–4.4)
HDL: 39 mg/dL — ABNORMAL LOW (ref >39)
LDL Calculated: 57 mg/dL (ref 0–99)
TRIGLYCERIDES: 161 mg/dL — AB (ref 0–149)
VLDL CHOLESTEROL CAL: 32 mg/dL (ref 5–40)

## 2016-06-22 LAB — MAGNESIUM: MAGNESIUM: 2.2 mg/dL (ref 1.6–2.3)

## 2017-05-20 ENCOUNTER — Other Ambulatory Visit: Payer: Self-pay | Admitting: Family Medicine

## 2017-05-20 DIAGNOSIS — K219 Gastro-esophageal reflux disease without esophagitis: Secondary | ICD-10-CM

## 2017-05-20 DIAGNOSIS — I10 Essential (primary) hypertension: Secondary | ICD-10-CM

## 2017-06-01 ENCOUNTER — Telehealth: Payer: Self-pay

## 2017-06-01 NOTE — Telephone Encounter (Signed)
Called pt to sched AWV w/ [facility]. Scheduled to be seen 06/15/17

## 2017-06-02 ENCOUNTER — Encounter: Payer: Self-pay | Admitting: Family Medicine

## 2017-06-02 ENCOUNTER — Ambulatory Visit: Payer: Medicare Other | Admitting: Family Medicine

## 2017-06-02 VITALS — BP 130/80 | HR 76 | Ht 65.0 in | Wt 225.0 lb

## 2017-06-02 DIAGNOSIS — J302 Other seasonal allergic rhinitis: Secondary | ICD-10-CM

## 2017-06-02 DIAGNOSIS — F17210 Nicotine dependence, cigarettes, uncomplicated: Secondary | ICD-10-CM

## 2017-06-02 DIAGNOSIS — Z23 Encounter for immunization: Secondary | ICD-10-CM

## 2017-06-02 DIAGNOSIS — K219 Gastro-esophageal reflux disease without esophagitis: Secondary | ICD-10-CM | POA: Diagnosis not present

## 2017-06-02 DIAGNOSIS — E785 Hyperlipidemia, unspecified: Secondary | ICD-10-CM | POA: Diagnosis not present

## 2017-06-02 DIAGNOSIS — I10 Essential (primary) hypertension: Secondary | ICD-10-CM

## 2017-06-02 DIAGNOSIS — I679 Cerebrovascular disease, unspecified: Secondary | ICD-10-CM

## 2017-06-02 MED ORDER — VARENICLINE TARTRATE 0.5 MG PO TABS: 0.5000 mg | ORAL_TABLET | Freq: Two times a day (BID) | ORAL | 1 refills | Status: DC

## 2017-06-02 MED ORDER — AMLODIPINE BESYLATE 10 MG PO TABS: 10.0000 mg | ORAL_TABLET | Freq: Every day | ORAL | 1 refills | Status: DC

## 2017-06-02 MED ORDER — CLOPIDOGREL BISULFATE 75 MG PO TABS: 75.0000 mg | ORAL_TABLET | Freq: Every day | ORAL | 1 refills | Status: DC

## 2017-06-02 MED ORDER — LORATADINE 10 MG PO TABS: 10.0000 mg | ORAL_TABLET | Freq: Every day | ORAL | 1 refills | Status: DC

## 2017-06-02 MED ORDER — HYDROCHLOROTHIAZIDE 12.5 MG PO CAPS: 12.5000 mg | ORAL_CAPSULE | Freq: Every day | ORAL | 1 refills | Status: DC

## 2017-06-02 MED ORDER — ESOMEPRAZOLE MAGNESIUM 40 MG PO CPDR: 40.0000 mg | DELAYED_RELEASE_CAPSULE | Freq: Every day | ORAL | 1 refills | Status: DC

## 2017-06-02 MED ORDER — ROSUVASTATIN CALCIUM 20 MG PO TABS: 20.0000 mg | ORAL_TABLET | Freq: Every day | ORAL | 1 refills | Status: DC

## 2017-06-02 MED ORDER — METOPROLOL SUCCINATE ER 50 MG PO TB24: ORAL_TABLET | ORAL | 1 refills | Status: DC

## 2017-06-02 NOTE — Progress Notes (Signed)
Name: Tracey Russo   MRN: [id number]    DOB: [birth year]   Date:06/02/2017       Progress Note  Subjective  Chief Complaint  Chief Complaint  Patient presents with  . Hyperlipidemia  . Hypertension  . Gastroesophageal Reflux  . Allergic Rhinitis   . Coronary Artery Disease    takes plavix  . Ear Pain    R) ear hurts- runs down to neck    Hyperlipidemia  This is a chronic problem. The current episode started more than 1 year ago. The problem is controlled. Recent lipid tests were reviewed and are normal. She has no history of chronic renal disease, diabetes, hypothyroidism, liver disease or nephrotic syndrome. There are no known factors aggravating her hyperlipidemia. Pertinent negatives include no chest pain, focal sensory loss, focal weakness, leg pain, myalgias or shortness of breath. Current antihyperlipidemic treatment includes statins. The current treatment provides moderate improvement of lipids. There are no compliance problems.  There are no known risk factors for coronary artery disease.  Hypertension  This is a chronic problem. The current episode started more than 1 year ago. The problem is unchanged. The problem is controlled. Pertinent negatives include no anxiety, blurred vision, chest pain, headaches, malaise/fatigue, neck pain, orthopnea, palpitations, peripheral edema, PND, shortness of breath or sweats. There are no associated agents to hypertension. Risk factors for coronary artery disease include dyslipidemia, smoking/tobacco exposure and obesity. Past treatments include angiotensin blockers, beta blockers, calcium channel blockers and diuretics. The current treatment provides no improvement. There are no compliance problems.  There is no history of angina, kidney disease, CAD/MI, CVA, heart failure, left ventricular hypertrophy, PVD or retinopathy. There is no history of chronic renal disease, a hypertension causing med or renovascular disease.  Gastroesophageal Reflux   She reports no abdominal pain, no belching, no chest pain, no choking, no coughing, no dysphagia, no early satiety, no globus sensation, no heartburn, no hoarse voice, no nausea, no sore throat, no stridor, no tooth decay, no water brash or no wheezing. 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 The current episode started more than 1 year ago. The problem occurs frequently. The problem has been gradually worsening. The symptoms are aggravated by certain foods. Pertinent negatives include no melena or weight loss. Risk factors include obesity. She has tried a PPI for the symptoms. The treatment provided moderate relief.  Neurologic Problem  The patient's pertinent negatives include no altered mental status, clumsiness, focal sensory loss, focal weakness, loss of balance, memory loss, near-syncope, slurred speech, syncope, visual change or weakness. Primary symptoms comment: embolic cva. This is a new problem. The current episode started more than 1 year ago. The problem has been waxing and waning since onset. There was right-sided focality noted. Associated symptoms include dizziness. Pertinent negatives include no abdominal pain, back pain, bladder incontinence, bowel incontinence, chest pain, fever, headaches, nausea, neck pain, palpitations or shortness of breath. There is no history of liver disease.    No problem-specific Assessment & Plan notes found for this encounter.   Past Medical History:  Diagnosis Date  . Allergy   . Depression   . Diabetes mellitus without complication ([place])   . GERD (gastroesophageal reflux disease)   . Hypertension   . Neuropathy     Past Surgical History:  Procedure Laterality Date  . CERVICAL FUSION  12/07/2000  . COLONOSCOPY  2006   Dr [name]  . CYSTOSTOMY  09/1984  . SHOULDER SURGERY Right 11/2006   tumor removed  .  SUBDURAL HEMATOMA EVACUATION VIA CRANIOTOMY  04/16/2007  . TONSILECTOMY, ADENOIDECTOMY, BILATERAL MYRINGOTOMY AND TUBES  03/2001  .  VAGINAL HYSTERECTOMY  02/23/2000    History reviewed. No pertinent family history.  Social History   Socioeconomic History  . Marital status: Married    Spouse name: Not on file  . Number of children: Not on file  . Years of education: Not on file  . Highest education level: Not on file  Social Needs  . Financial resource strain: Not on file  . Food insecurity - worry: Not on file  . Food insecurity - inability: Not on file  . Transportation needs - medical: Not on file  . Transportation needs - non-medical: Not on file  Occupational History  . Not on file  Tobacco Use  . Smoking status: Current Every Day Smoker  . Smokeless tobacco: Never Used  Substance and Sexual Activity  . Alcohol use: Yes    Alcohol/week: 0.0 oz  . Drug use: No  . Sexual activity: Yes  Other Topics Concern  . Not on file  Social History Narrative  . Not on file    Allergies  Allergen Reactions  . Ace Inhibitors Cough  . Corticosteroids     Other reaction(s): OTHER  . Nsaids     Other reaction(s): ANAPHYLAXIS    Outpatient Medications Prior to Visit  Medication Sig Dispense Refill  . aspirin 81 MG tablet Take 81 mg by mouth daily.    . citalopram (CELEXA) 40 MG tablet Take 40 mg by mouth daily. Dr [name]    . gabapentin (NEURONTIN) 300 MG capsule Take 1 capsule by mouth every 6 (six) hours as needed. Dr Eddie Dibbles    . HUMALOG 100 UNIT/ML injection Insulin pump- humalog    . insulin lispro (HUMALOG) 100 UNIT/ML injection pump    . Liraglutide (VICTOZA) 18 MG/3ML SOPN Inject 1.8 mg into the skin daily. Dr Eddie Dibbles    . losartan (COZAAR) 25 MG tablet Take 1 tablet by mouth daily.    . metFORMIN (GLUCOPHAGE) 1000 MG tablet Take 1 tablet by mouth 2 (two) times daily. Dr Eddie Dibbles    . mupirocin cream (BACTROBAN) 2 % Apply 1 application topically 2 (two) times daily. 30 g 1  . amLODipine (NORVASC) 10 MG tablet TAKE 1 TABLET BY MOUTH ONCE DAILY 15 tablet 0  . clopidogrel (PLAVIX) 75 MG tablet Take 1 tablet (75  mg total) by mouth daily. 30 tablet 6  . esomeprazole (NEXIUM) 40 MG capsule TAKE 1 CAPSULE BY MOUTH ONCE DAILY 15 capsule 0  . hydrochlorothiazide (MICROZIDE) 12.5 MG capsule Take 1 capsule (12.5 mg total) by mouth daily. 30 capsule 6  . loratadine (CLARITIN) 10 MG tablet Take 1 tablet (10 mg total) by mouth daily. 30 tablet 6  . metoprolol succinate (TOPROL-XL) 50 MG 24 hr tablet TAKE 1 TABLET BY MOUTH ONCE DAILY WITH  OR  IMMEDIATELY  FOLLOWING  A  MEAL 15 tablet 0  . rosuvastatin (CRESTOR) 20 MG tablet Take 1 tablet (20 mg total) by mouth daily. 30 tablet 6  . budesonide-formoterol (SYMBICORT) 160-4.5 MCG/ACT inhaler Inhale 2 puffs into the lungs 2 (two) times daily. 1 Inhaler 3  . insulin aspart (NOVOLOG) 100 UNIT/ML injection 16 Units daily. Dr Eddie Dibbles    . insulin glargine (LANTUS) 100 UNIT/ML injection Inject into the skin at bedtime. Sliding scale- Dr Eddie Dibbles     No facility-administered medications prior to visit.     Review of Systems  Constitutional: Negative for  chills, fever, malaise/fatigue and weight loss.  HENT: Negative for ear discharge, ear pain, hoarse voice and sore throat.   Eyes: Negative for blurred vision.  Respiratory: Negative for cough, sputum production, choking, shortness of breath and wheezing.   Cardiovascular: Negative for chest pain, palpitations, orthopnea, leg swelling, PND and near-syncope.  Gastrointestinal: Negative for abdominal pain, blood in stool, bowel incontinence, constipation, diarrhea, dysphagia, heartburn, melena and nausea.  Genitourinary: Negative for bladder incontinence, dysuria, frequency, hematuria and urgency.  Musculoskeletal: Negative for back pain, joint pain, myalgias and neck pain.  Skin: Negative for rash.  Neurological: Positive for dizziness. Negative for tingling, sensory change, focal weakness, syncope, weakness, headaches and loss of balance.  Endo/Heme/Allergies: Negative for environmental allergies and polydipsia. Does not  bruise/bleed easily.  Psychiatric/Behavioral: Negative for depression, memory loss and suicidal ideas. The patient is not nervous/anxious and does not have insomnia.      Objective  Vitals:   06/02/17 1334  BP: 130/80  Pulse: 76  Weight: 225 lb (102.1 kg)  Height: 5\' 5"  (1.651 m)    Physical Exam  Constitutional: She is well-developed, well-nourished, and in no distress. No distress.  HENT:  Head: Normocephalic and atraumatic.  Right Ear: External ear normal.  Left Ear: External ear normal.  Nose: Nose normal.  Mouth/Throat: Oropharynx is clear and moist.  Eyes: Conjunctivae and EOM are normal. Pupils are equal, round, and reactive to light. Right eye exhibits no discharge. Left eye exhibits no discharge.  Neck: Normal range of motion. Neck supple. No JVD present. No thyromegaly present.  Cardiovascular: Normal rate, regular rhythm, normal heart sounds and intact distal pulses. Exam reveals no gallop and no friction rub.  No murmur heard. Pulmonary/Chest: Effort normal and breath sounds normal. She has no wheezes. She has no rales.  Abdominal: Soft. Bowel sounds are normal. She exhibits no mass. There is no tenderness. There is no guarding.  Musculoskeletal: Normal range of motion. She exhibits no edema.  Lymphadenopathy:    She has no cervical adenopathy.  Neurological: She is alert. She has normal reflexes.  Skin: Skin is warm and dry. She is not diaphoretic.  Psychiatric: Mood and affect normal.  Nursing note and vitals reviewed.     Assessment & Plan  Problem List Items Addressed This Visit      Cardiovascular and Mediastinum   Essential hypertension - Primary   Relevant Medications   losartan (COZAAR) 25 MG tablet   rosuvastatin (CRESTOR) 20 MG tablet   metoprolol succinate (TOPROL-XL) 50 MG 24 hr tablet   amLODipine (NORVASC) 10 MG tablet   hydrochlorothiazide (MICROZIDE) 12.5 MG capsule   Other Relevant Orders   Renal Function Panel     Respiratory    Acute seasonal allergic rhinitis   Relevant Medications   loratadine (CLARITIN) 10 MG tablet     Digestive   Gastroesophageal reflux disease   Relevant Medications   esomeprazole (NEXIUM) 40 MG capsule     Other   Dyslipidemia   Relevant Medications   rosuvastatin (CRESTOR) 20 MG tablet   Other Relevant Orders   Lipid Profile    Other Visit Diagnoses    Cerebrovascular disease       Relevant Medications   losartan (COZAAR) 25 MG tablet   clopidogrel (PLAVIX) 75 MG tablet   rosuvastatin (CRESTOR) 20 MG tablet   metoprolol succinate (TOPROL-XL) 50 MG 24 hr tablet   amLODipine (NORVASC) 10 MG tablet   hydrochlorothiazide (MICROZIDE) 12.5 MG capsule   Cigarette nicotine dependence without  complication       Relevant Medications   varenicline (CHANTIX) 0.5 MG tablet   Need for vaccination with 13-polyvalent pneumococcal conjugate vaccine       Relevant Orders   Pneumococcal conjugate vaccine 13-valent IM (Completed)   Influenza vaccine needed       Relevant Orders   POCT Influenza A/B   Flu Vaccine QUAD 6+ mos PF IM (Fluarix Quad PF) (Completed)      Meds ordered this encounter  Medications  . loratadine (CLARITIN) 10 MG tablet    Sig: Take 1 tablet (10 mg total) by mouth daily.    Dispense:  90 tablet    Refill:  1  . clopidogrel (PLAVIX) 75 MG tablet    Sig: Take 1 tablet (75 mg total) by mouth daily.    Dispense:  90 tablet    Refill:  1    Please consider 90 day supplies to promote better adherence  . rosuvastatin (CRESTOR) 20 MG tablet    Sig: Take 1 tablet (20 mg total) by mouth daily.    Dispense:  90 tablet    Refill:  1  . metoprolol succinate (TOPROL-XL) 50 MG 24 hr tablet    Sig: TAKE 1 TABLET BY MOUTH ONCE DAILY WITH  OR  IMMEDIATELY  FOLLOWING  A  MEAL    Dispense:  90 tablet    Refill:  1    Please consider 90 day supplies to promote better adherence  . amLODipine (NORVASC) 10 MG tablet    Sig: Take 1 tablet (10 mg total) by mouth daily.     Dispense:  90 tablet    Refill:  1    Not seen in a year- sched appt  . hydrochlorothiazide (MICROZIDE) 12.5 MG capsule    Sig: Take 1 capsule (12.5 mg total) by mouth daily.    Dispense:  90 capsule    Refill:  1  . esomeprazole (NEXIUM) 40 MG capsule    Sig: Take 1 capsule (40 mg total) by mouth daily.    Dispense:  90 capsule    Refill:  1    Please consider 90 day supplies to promote better adherence  . varenicline (CHANTIX) 0.5 MG tablet    Sig: Take 1 tablet (0.5 mg total) by mouth 2 (two) times daily.    Dispense:  60 tablet    Refill:  1   I spent 45 minutes with this patient, More than 50% of that time was spent in face to face education, counseling and care coordination.   Dr. Mac[facility]  06/02/17

## 2017-06-03 ENCOUNTER — Other Ambulatory Visit: Payer: Self-pay

## 2017-06-03 LAB — RENAL FUNCTION PANEL
Albumin: 4.6 g/dL (ref 3.6–4.8)
BUN / CREAT RATIO: 15 (ref 12–28)
BUN: 12 mg/dL (ref 8–27)
CO2: 25 mmol/L (ref 20–29)
CREATININE: 0.81 mg/dL (ref 0.57–1.00)
Calcium: 9.7 mg/dL (ref 8.7–10.3)
Chloride: 99 mmol/L (ref 96–106)
GFR calc Af Amer: 86 mL/min/{1.73_m2} (ref >59)
GFR, EST NON AFRICAN AMERICAN: 75 mL/min/{1.73_m2} (ref >59)
Glucose: 128 mg/dL — ABNORMAL HIGH (ref 65–99)
Phosphorus: 3.2 mg/dL (ref 2.5–4.5)
Potassium: 4.1 mmol/L (ref 3.5–5.2)
SODIUM: 142 mmol/L (ref 134–144)

## 2017-06-03 LAB — LIPID PANEL
CHOL/HDL RATIO: 2.5 ratio (ref 0.0–4.4)
Cholesterol, Total: 106 mg/dL (ref 100–199)
HDL: 43 mg/dL (ref >39)
LDL CALC: 38 mg/dL (ref 0–99)
TRIGLYCERIDES: 123 mg/dL (ref 0–149)
VLDL Cholesterol Cal: 25 mg/dL (ref 5–40)

## 2017-06-10 ENCOUNTER — Telehealth: Payer: Self-pay | Admitting: Family Medicine

## 2017-06-10 NOTE — Telephone Encounter (Signed)
Patient called and said she does not want to do the wellness visit.

## 2017-06-15 ENCOUNTER — Ambulatory Visit: Payer: Self-pay

## 2017-08-01 ENCOUNTER — Other Ambulatory Visit: Payer: Self-pay

## 2017-08-01 ENCOUNTER — Telehealth: Payer: Self-pay

## 2017-08-01 DIAGNOSIS — D45 Polycythemia vera: Secondary | ICD-10-CM

## 2017-08-01 NOTE — Telephone Encounter (Signed)
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

## 2017-09-20 ENCOUNTER — Other Ambulatory Visit: Payer: Self-pay

## 2017-10-03 ENCOUNTER — Encounter: Payer: Self-pay | Admitting: Oncology

## 2017-10-03 ENCOUNTER — Inpatient Hospital Stay: Payer: Medicare Other

## 2017-10-03 ENCOUNTER — Inpatient Hospital Stay: Payer: Medicare Other | Attending: Oncology | Admitting: Oncology

## 2017-10-03 VITALS — BP 137/92 | HR 86 | Temp 97.8°F | Resp 18 | Ht 65.0 in | Wt 233.6 lb

## 2017-10-03 DIAGNOSIS — R5383 Other fatigue: Secondary | ICD-10-CM | POA: Diagnosis not present

## 2017-10-03 DIAGNOSIS — M549 Dorsalgia, unspecified: Secondary | ICD-10-CM | POA: Diagnosis not present

## 2017-10-03 DIAGNOSIS — Z862 Personal history of diseases of the blood and blood-forming organs and certain disorders involving the immune mechanism: Secondary | ICD-10-CM

## 2017-10-03 DIAGNOSIS — E119 Type 2 diabetes mellitus without complications: Secondary | ICD-10-CM

## 2017-10-03 DIAGNOSIS — M25559 Pain in unspecified hip: Secondary | ICD-10-CM

## 2017-10-03 DIAGNOSIS — E785 Hyperlipidemia, unspecified: Secondary | ICD-10-CM | POA: Diagnosis not present

## 2017-10-03 DIAGNOSIS — G629 Polyneuropathy, unspecified: Secondary | ICD-10-CM | POA: Diagnosis not present

## 2017-10-03 DIAGNOSIS — G8929 Other chronic pain: Secondary | ICD-10-CM | POA: Diagnosis not present

## 2017-10-03 DIAGNOSIS — Z79899 Other long term (current) drug therapy: Secondary | ICD-10-CM

## 2017-10-03 DIAGNOSIS — Z7982 Long term (current) use of aspirin: Secondary | ICD-10-CM

## 2017-10-03 DIAGNOSIS — D751 Secondary polycythemia: Secondary | ICD-10-CM

## 2017-10-03 DIAGNOSIS — F329 Major depressive disorder, single episode, unspecified: Secondary | ICD-10-CM

## 2017-10-03 DIAGNOSIS — Z8673 Personal history of transient ischemic attack (TIA), and cerebral infarction without residual deficits: Secondary | ICD-10-CM | POA: Diagnosis not present

## 2017-10-03 DIAGNOSIS — K219 Gastro-esophageal reflux disease without esophagitis: Secondary | ICD-10-CM | POA: Diagnosis not present

## 2017-10-03 DIAGNOSIS — G4730 Sleep apnea, unspecified: Secondary | ICD-10-CM | POA: Diagnosis not present

## 2017-10-03 DIAGNOSIS — I10 Essential (primary) hypertension: Secondary | ICD-10-CM

## 2017-10-03 DIAGNOSIS — Z87891 Personal history of nicotine dependence: Secondary | ICD-10-CM | POA: Diagnosis not present

## 2017-10-03 DIAGNOSIS — Z794 Long term (current) use of insulin: Secondary | ICD-10-CM

## 2017-10-03 LAB — CBC WITH DIFFERENTIAL/PLATELET
Basophils Absolute: 0.1 10*3/uL (ref 0–0.1)
Basophils Relative: 1 %
EOS ABS: 0.2 10*3/uL (ref 0–0.7)
Eosinophils Relative: 4 %
HCT: 41.1 % (ref 35.0–47.0)
HEMOGLOBIN: 13.6 g/dL (ref 12.0–16.0)
Lymphocytes Relative: 34 %
Lymphs Abs: 2 10*3/uL (ref 1.0–3.6)
MCH: 30.8 pg (ref 26.0–34.0)
MCHC: 33 g/dL (ref 32.0–36.0)
MCV: 93.4 fL (ref 80.0–100.0)
MONOS PCT: 9 %
Monocytes Absolute: 0.5 10*3/uL (ref 0.2–0.9)
NEUTROS PCT: 52 %
Neutro Abs: 3.2 10*3/uL (ref 1.4–6.5)
Platelets: 169 10*3/uL (ref 150–440)
RBC: 4.4 MIL/uL (ref 3.80–5.20)
RDW: 13.5 % (ref 11.5–14.5)
WBC: 6 10*3/uL (ref 3.6–11.0)

## 2017-10-03 LAB — COMPREHENSIVE METABOLIC PANEL
ALK PHOS: 63 U/L (ref 38–126)
ALT: 41 U/L (ref 0–44)
AST: 48 U/L — ABNORMAL HIGH (ref 15–41)
Albumin: 3.8 g/dL (ref 3.5–5.0)
Anion gap: 13 (ref 5–15)
BUN: 11 mg/dL (ref 8–23)
CALCIUM: 8.6 mg/dL — AB (ref 8.9–10.3)
CO2: 26 mmol/L (ref 22–32)
CREATININE: 0.73 mg/dL (ref 0.44–1.00)
Chloride: 98 mmol/L (ref 98–111)
GFR calc non Af Amer: >60 mL/min (ref >60)
Glucose, Bld: 205 mg/dL — ABNORMAL HIGH (ref 70–99)
Potassium: 3.7 mmol/L (ref 3.5–5.1)
SODIUM: 137 mmol/L (ref 135–145)
Total Bilirubin: 0.6 mg/dL (ref 0.3–1.2)
Total Protein: 7.7 g/dL (ref 6.5–8.1)

## 2017-10-03 NOTE — Progress Notes (Signed)
Hematology/Oncology Consult note [facility] [phone] [phone]  Patient Care Team: [name], MD as PCP - General (Family Medicine)   Name of the patient: Tracey Russo  [id number]  1950-12-27    Reason for referral- h/o polycythemia   Referring physician- Dr. Ronnald Ramp  Date of visit: 10/03/17   History of presenting illness-patient is a 67 year old female with a past medical history significant for hypertension hyperlipidemia type 2 diabetes and GERD among other medical problems.  She states that she was diagnosed with polycythemia and has seen regional cancer care in [place] for this.  She has apparently undergone phlebotomies in the past sometimes every 2 weeks when her hemoglobin was very high.  I do not have any of those records for me to review.  On looking back at her CBCs since 2009 until present her hemoglobin has always been between 13-14 and in fact she has sometimes been anemic in the past but I do not see any high hemoglobin values.  She has smoked about half a pack of cigarettes per day for 30+ years and quit smoking 4 months ago.  She also has a history of obstructive sleep apnea for which she is on CPAP.  She reports chronic back pain and hip pain as well as chronic fatigue.  She also has tingling numbness in her extremities from diabetes.  She has not had any heart attacks.  Reports she had a mini stroke back in 2004.  She does not remember getting Jak 2 mutation testing done  ECOG PS- 1  Pain scale- 0   Review of systems- Review of Systems  Constitutional: Positive for malaise/fatigue. Negative for chills, fever and weight loss.  HENT: Negative for congestion, ear discharge and nosebleeds.   Eyes: Negative for blurred vision.  Respiratory: Negative for cough, hemoptysis, sputum production, shortness of breath and wheezing.   Cardiovascular: Negative for chest pain, palpitations, orthopnea and claudication.    Gastrointestinal: Negative for abdominal pain, blood in stool, constipation, diarrhea, heartburn, melena, nausea and vomiting.  Genitourinary: Negative for dysuria, flank pain, frequency, hematuria and urgency.  Musculoskeletal: Positive for back pain and joint pain. Negative for myalgias.  Skin: Negative for rash.  Neurological: Positive for tingling. Negative for dizziness, focal weakness, seizures, weakness and headaches.  Endo/Heme/Allergies: Does not bruise/bleed easily.  Psychiatric/Behavioral: Negative for depression and suicidal ideas. The patient does not have insomnia.     Allergies  Allergen Reactions  . Ace Inhibitors Cough  . Corticosteroids     Other reaction(s): OTHER  . Nsaids     Other reaction(s): ANAPHYLAXIS    Patient Active Problem List   Diagnosis Date Noted  . Mixed hyperlipidemia 12/22/2015  . Acute seasonal allergic rhinitis 12/22/2015  . Acute maxillary sinusitis 12/22/2015  . Incomplete bladder emptying 02/05/2014  . Dyslipidemia 08/09/2013  . Gastroesophageal reflux disease 08/09/2013  . Essential hypertension 08/09/2013  . Diabetes mellitus, type 2 ([place]) 08/09/2013  . Acquired cyst of kidney 09/11/2012  . 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 Urge incontinence 09/11/2012  . Frank hematuria 05/08/2012  . Mixed incontinence 05/08/2012  . Renal colic 67/34/1937     Past Medical History:  Diagnosis Date  . Allergy   . Depression   . Diabetes mellitus without complication ([place])   . GERD (gastroesophageal reflux disease)   . Hypertension   . Neuropathy      Past Surgical History:  Procedure Laterality Date  . CERVICAL FUSION  12/07/2000  . COLONOSCOPY  2006   Dr [name]  . CYSTOSTOMY  09/1984  . SHOULDER SURGERY Right 11/2006   tumor removed  . SUBDURAL HEMATOMA EVACUATION VIA CRANIOTOMY  04/16/2007  . TONSILECTOMY, ADENOIDECTOMY, BILATERAL MYRINGOTOMY AND TUBES  03/2001  . VAGINAL HYSTERECTOMY  02/23/2000    Social  History   Socioeconomic History  . Marital status: Married    Spouse name: Not on file  . Number of children: Not on file  . Years of education: Not on file  . Highest education level: Not on file  Occupational History  . Not on file  Social Needs  . Financial resource strain: Not on file  . Food insecurity:    Worry: Not on file    Inability: Not on file  . Transportation needs:    Medical: Not on file    Non-medical: Not on file  Tobacco Use  . Smoking status: Former Research scientist (life sciences)  . Smokeless tobacco: Never Used  Substance and Sexual Activity  . Alcohol use: Yes    Alcohol/week: 0.0 oz  . Drug use: No  . Sexual activity: Yes  Lifestyle  . Physical activity:    Days per week: Not on file    Minutes per session: Not on file  . Stress: Not on file  Relationships  . Social connections:    Talks on phone: Not on file    Gets together: Not on file    Attends religious service: Not on file    Active member of club or organization: Not on file    Attends meetings of clubs or organizations: Not on file    Relationship status: Not on file  . Intimate partner violence:    Fear of current or ex partner: Not on file    Emotionally abused: Not on file    Physically abused: Not on file    Forced sexual activity: Not on file  Other Topics Concern  . Not on file  Social History Narrative  . Not on file     Family History  Problem Relation Age of Onset  . Leukemia Paternal Grandfather      Current Outpatient Medications:  .  amLODipine (NORVASC) 10 MG tablet, Take 1 tablet (10 mg total) by mouth daily., Disp: 90 tablet, Rfl: 1 .  aspirin 81 MG tablet, Take 81 mg by mouth daily., Disp: , Rfl:  .  citalopram (CELEXA) 40 MG tablet, Take 40 mg by mouth daily. Dr [name], Disp: , Rfl:  .  clopidogrel (PLAVIX) 75 MG tablet, Take 1 tablet (75 mg total) by mouth daily., Disp: 90 tablet, Rfl: 1 .  esomeprazole (NEXIUM) 40 MG capsule, Take 1 capsule (40 mg total) by mouth daily., Disp: 90  capsule, Rfl: 1 .  gabapentin (NEURONTIN) 300 MG capsule, Take 1 capsule by mouth every 6 (six) hours as needed. Dr Eddie Dibbles, Disp: , Rfl:  .  HUMALOG 100 UNIT/ML injection, Insulin pump- humalog, Disp: , Rfl:  .  hydrochlorothiazide (MICROZIDE) 12.5 MG capsule, Take 1 capsule (12.5 mg total) by mouth daily., Disp: 90 capsule, Rfl: 1 .  insulin lispro (HUMALOG) 100 UNIT/ML injection, pump, Disp: , Rfl:  .  Liraglutide (VICTOZA) 18 MG/3ML SOPN, Inject 1.8 mg into the skin daily. Dr Eddie Dibbles, Disp: , Rfl:  .  loratadine (CLARITIN) 10 MG tablet, Take 1 tablet (10 mg total) by mouth daily., Disp: 90 tablet, Rfl: 1 .  losartan (COZAAR) 25 MG tablet, Take 1 tablet by mouth daily., Disp: , Rfl:  .  metFORMIN (GLUCOPHAGE)  1000 MG tablet, Take 1 tablet by mouth 2 (two) times daily. Dr Eddie Dibbles, Disp: , Rfl:  .  metoprolol succinate (TOPROL-XL) 50 MG 24 hr tablet, TAKE 1 TABLET BY MOUTH ONCE DAILY WITH  OR  IMMEDIATELY  FOLLOWING  A  MEAL, Disp: 90 tablet, Rfl: 1 .  mupirocin cream (BACTROBAN) 2 %, Apply 1 application topically 2 (two) times daily., Disp: 30 g, Rfl: 1 .  rosuvastatin (CRESTOR) 20 MG tablet, Take 1 tablet (20 mg total) by mouth daily., Disp: 90 tablet, Rfl: 1 .  varenicline (CHANTIX) 0.5 MG tablet, Take 1 tablet (0.5 mg total) by mouth 2 (two) times daily. (Patient not taking: Reported on 10/03/2017), Disp: 60 tablet, Rfl: 1   Physical exam:  Vitals:   10/03/17 1047  BP: (!) 137/92  Pulse: 86  Resp: 18  Temp: 97.8 F (36.6 C)  TempSrc: Tympanic  SpO2: 98%  Weight: 233 lb 9.2 oz (106 kg)  Height: 5\' 5"  (1.651 m)   Physical Exam  Constitutional: She is oriented to person, place, and time.  Patient is obese.  Does not appear to be in any acute distress  HENT:  Head: Normocephalic and atraumatic.  Eyes: Pupils are equal, round, and reactive to light. EOM are normal.  Neck: Normal range of motion.  Cardiovascular: Normal rate, regular rhythm and normal heart sounds.  Pulmonary/Chest: Effort  normal and breath sounds normal.  Abdominal: Soft. Bowel sounds are normal.  No palpable splenomegaly  Neurological: She is alert and oriented to person, place, and time.  Skin: Skin is warm and dry.       CMP Latest Ref Rng & Units 06/02/2017  Glucose 65 - 99 mg/dL 128(H)  BUN 8 - 27 mg/dL 12  Creatinine 0.57 - 1.00 mg/dL 0.81  Sodium 134 - 144 mmol/L 142  Potassium 3.5 - 5.2 mmol/L 4.1  Chloride 96 - 106 mmol/L 99  CO2 20 - 29 mmol/L 25  Calcium 8.7 - 10.3 mg/dL 9.7    Assessment and plan- Patient is a 67 y.o. female referred for history of polycythemia  I do not have any records of prior testing.  Also her CBC from 2009 untreated present has not revealed any high hemoglobin values.  Her hemoglobin has been mostly between 13-14 for the most part.  I doubt patient has polycythemia vera.  However I will check a CBC with differential, CMP, EPO level and Jak 2 mutation testing.  I will see the patient back in 2 weeks time to discuss the results of her blood work and further management.  We will try to obtain her [facility]cy[facility]mia [facility]   Thank you for this kind referral and the opportunity to participate in the care of this patient   Visit Diagnosis 1. H/O polycythemia     Dr. Randa Evens, MD, MPH [facility] at [facility] [phone] 10/03/2017  11:20 AM

## 2017-10-04 LAB — ERYTHROPOIETIN: ERYTHROPOIETIN: 14.5 m[IU]/mL (ref 2.6–18.5)

## 2017-10-07 LAB — JAK2 GENOTYPR

## 2017-10-17 ENCOUNTER — Encounter: Payer: Self-pay | Admitting: Family Medicine

## 2017-10-17 ENCOUNTER — Inpatient Hospital Stay (HOSPITAL_BASED_OUTPATIENT_CLINIC_OR_DEPARTMENT_OTHER): Payer: Medicare Other | Admitting: Oncology

## 2017-10-17 ENCOUNTER — Telehealth: Payer: Self-pay | Admitting: *Deleted

## 2017-10-17 ENCOUNTER — Ambulatory Visit: Payer: Medicare Other | Admitting: Oncology

## 2017-10-17 ENCOUNTER — Encounter: Payer: Self-pay | Admitting: Oncology

## 2017-10-17 ENCOUNTER — Ambulatory Visit (INDEPENDENT_AMBULATORY_CARE_PROVIDER_SITE_OTHER): Payer: Medicare Other | Admitting: Family Medicine

## 2017-10-17 VITALS — BP 190/99 | HR 78 | Temp 97.9°F | Resp 18 | Ht 65.0 in | Wt 238.8 lb

## 2017-10-17 VITALS — BP 190/102 | HR 80 | Ht 65.0 in | Wt 238.0 lb

## 2017-10-17 DIAGNOSIS — Z7982 Long term (current) use of aspirin: Secondary | ICD-10-CM

## 2017-10-17 DIAGNOSIS — E785 Hyperlipidemia, unspecified: Secondary | ICD-10-CM | POA: Diagnosis not present

## 2017-10-17 DIAGNOSIS — M549 Dorsalgia, unspecified: Secondary | ICD-10-CM

## 2017-10-17 DIAGNOSIS — Z862 Personal history of diseases of the blood and blood-forming organs and certain disorders involving the immune mechanism: Secondary | ICD-10-CM

## 2017-10-17 DIAGNOSIS — Z794 Long term (current) use of insulin: Secondary | ICD-10-CM

## 2017-10-17 DIAGNOSIS — G4730 Sleep apnea, unspecified: Secondary | ICD-10-CM

## 2017-10-17 DIAGNOSIS — F339 Major depressive disorder, recurrent, unspecified: Secondary | ICD-10-CM | POA: Insufficient documentation

## 2017-10-17 DIAGNOSIS — R5383 Other fatigue: Secondary | ICD-10-CM

## 2017-10-17 DIAGNOSIS — D751 Secondary polycythemia: Secondary | ICD-10-CM | POA: Diagnosis not present

## 2017-10-17 DIAGNOSIS — E119 Type 2 diabetes mellitus without complications: Secondary | ICD-10-CM | POA: Diagnosis not present

## 2017-10-17 DIAGNOSIS — M25559 Pain in unspecified hip: Secondary | ICD-10-CM

## 2017-10-17 DIAGNOSIS — K219 Gastro-esophageal reflux disease without esophagitis: Secondary | ICD-10-CM

## 2017-10-17 DIAGNOSIS — G8929 Other chronic pain: Secondary | ICD-10-CM

## 2017-10-17 DIAGNOSIS — Z87891 Personal history of nicotine dependence: Secondary | ICD-10-CM

## 2017-10-17 DIAGNOSIS — F329 Major depressive disorder, single episode, unspecified: Secondary | ICD-10-CM

## 2017-10-17 DIAGNOSIS — Z79899 Other long term (current) drug therapy: Secondary | ICD-10-CM

## 2017-10-17 DIAGNOSIS — Z8673 Personal history of transient ischemic attack (TIA), and cerebral infarction without residual deficits: Secondary | ICD-10-CM

## 2017-10-17 DIAGNOSIS — I10 Essential (primary) hypertension: Secondary | ICD-10-CM | POA: Diagnosis not present

## 2017-10-17 DIAGNOSIS — G629 Polyneuropathy, unspecified: Secondary | ICD-10-CM

## 2017-10-17 NOTE — Telephone Encounter (Signed)
Dr. Janese Banks wanted pt to be rechecked with b/p elevated. The recheck was 186/110.  I called Dr. Ronnald Ramp office and spoke to [place] and told her the vs before md seen pt 190/99 and then repeat is above.  Baxter Flattery said for the pt. To come to their office right now. It is upstairs from our cancer center and pt's family with her and will take her up.  Also I mentioned to Baxter Flattery and the patient about her sugar level  205 that she had done 7/15 when she had labs done.  Per Baxter Flattery they referred her to endocrinology and Dr. Eddie Dibbles has left and a new person is there [facility].  I will fax over the results.

## 2017-10-17 NOTE — Assessment & Plan Note (Signed)
Acutely elevated in cancer center. Increase losartan 50g bid, HCTZ 12.5 bid, and metoprolol 50 mg bid . Continue amlodipine to q am. Rebcheck bp in 1 week.

## 2017-10-17 NOTE — Progress Notes (Signed)
Name: Tracey Russo   MRN: [id number]    DOB: [birth year]   Date:10/17/2017       Progress Note  Subjective  Chief Complaint  Chief Complaint  Patient presents with  . Hypertension    took b/p med last night    Hypertension  This is a chronic problem. The current episode started more than 1 year ago. The problem is controlled. Pertinent negatives include no anxiety, blurred vision, chest pain, headaches, malaise/fatigue, neck pain, orthopnea, palpitations, peripheral edema, PND, shortness of breath or sweats. There are no associated agents to hypertension. Risk factors for coronary artery disease include diabetes mellitus and dyslipidemia. Past treatments include angiotensin blockers, calcium channel blockers, beta blockers and diuretics. The current treatment provides moderate improvement. There is no history of angina, kidney disease, CVA, heart failure, left ventricular hypertrophy or PVD. There is no history of chronic renal disease, a hypertension causing med or renovascular disease.    Essential hypertension Acutely elevated in cancer center. Increase losartan 50g bid, HCTZ 12.5 bid, and metoprolol 50 mg bid . Continue amlodipine to q am. Rebcheck bp in 1 week.   Past Medical History:  Diagnosis Date  . Allergy   . Depression   . Diabetes mellitus without complication ([place])   . GERD (gastroesophageal reflux disease)   . Hypertension   . Neuropathy     Past Surgical History:  Procedure Laterality Date  . CERVICAL FUSION  12/07/2000  . COLONOSCOPY  2006   Dr [name]  . CYSTOSTOMY  09/1984  . SHOULDER SURGERY Right 11/2006   tumor removed  . SUBDURAL HEMATOMA EVACUATION VIA CRANIOTOMY  04/16/2007  . TONSILECTOMY, ADENOIDECTOMY, BILATERAL MYRINGOTOMY AND TUBES  03/2001  . VAGINAL HYSTERECTOMY  02/23/2000    Family History  Problem Relation Age of Onset  . Leukemia Paternal Grandfather     Social History   Socioeconomic History  . Marital status: Married    Spouse  name: Not on file  . Number of children: Not on file  . Years of education: Not on file  . Highest education level: Not on file  Occupational History  . Not on file  Social Needs  . Financial resource strain: Not on file  . Food insecurity:    Worry: Not on file    Inability: Not on file  . Transportation needs:    Medical: Not on file    Non-medical: Not on file  Tobacco Use  . Smoking status: Former Research scientist (life sciences)  . Smokeless tobacco: Never Used  Substance and Sexual Activity  . Alcohol use: Yes    Alcohol/week: 0.0 oz  . Drug use: No  . Sexual activity: Yes  Lifestyle  . Physical activity:    Days per week: Not on file    Minutes per session: Not on file  . Stress: Not on file  Relationships  . Social connections:    Talks on phone: Not on file    Gets together: Not on file    Attends religious service: Not on file    Active member of club or organization: Not on file    Attends meetings of clubs or organizations: Not on file    Relationship status: Not on file  . Intimate partner violence:    Fear of current or ex partner: Not on file    Emotionally abused: Not on file    Physically abused: Not on file    Forced sexual activity: Not on file  Other Topics Concern  . Not  on file  Social History Narrative  . Not on file    Allergies  Allergen Reactions  . Ace Inhibitors Cough  . Corticosteroids     Other reaction(s): OTHER  . Nsaids     Other reaction(s): ANAPHYLAXIS    Outpatient Medications Prior to Visit  Medication Sig Dispense Refill  . amLODipine (NORVASC) 10 MG tablet Take 1 tablet (10 mg total) by mouth daily. 90 tablet 1  . aspirin 81 MG tablet Take 81 mg by mouth daily.    . citalopram (CELEXA) 40 MG tablet Take 40 mg by mouth daily. Dr [name]    . clopidogrel (PLAVIX) 75 MG tablet Take 1 tablet (75 mg total) by mouth daily. 90 tablet 1  . esomeprazole (NEXIUM) 40 MG capsule Take 1 capsule (40 mg total) by mouth daily. 90 capsule 1  . gabapentin  (NEURONTIN) 300 MG capsule Take 1 capsule by mouth every 6 (six) hours as needed. Dr Eddie Dibbles    . HUMALOG 100 UNIT/ML injection Insulin pump- humalog    . hydrochlorothiazide (MICROZIDE) 12.5 MG capsule Take 1 capsule (12.5 mg total) by mouth daily. 90 capsule 1  . insulin lispro (HUMALOG) 100 UNIT/ML injection pump    . Liraglutide (VICTOZA) 18 MG/3ML SOPN Inject 1.8 mg into the skin daily. Dr Eddie Dibbles    . loratadine (CLARITIN) 10 MG tablet Take 1 tablet (10 mg total) by mouth daily. 90 tablet 1  . losartan (COZAAR) 25 MG tablet Take 1 tablet by mouth daily.    . metFORMIN (GLUCOPHAGE) 1000 MG tablet Take 1 tablet by mouth 2 (two) times daily. Dr Eddie Dibbles    . metoprolol succinate (TOPROL-XL) 50 MG 24 hr tablet TAKE 1 TABLET BY MOUTH ONCE DAILY WITH  OR  IMMEDIATELY  FOLLOWING  A  MEAL 90 tablet 1  . mupirocin cream (BACTROBAN) 2 % Apply 1 application topically 2 (two) times daily. 30 g 1  . rosuvastatin (CRESTOR) 20 MG tablet Take 1 tablet (20 mg total) by mouth daily. 90 tablet 1  . varenicline (CHANTIX) 0.5 MG tablet Take 1 tablet (0.5 mg total) by mouth 2 (two) times daily. 60 tablet 1   No facility-administered medications prior to visit.     Review of Systems  Constitutional: Negative for chills, fever, malaise/fatigue and weight loss.  HENT: Negative for ear discharge, ear pain and sore throat.   Eyes: Negative for blurred vision.  Respiratory: Negative for cough, sputum production, shortness of breath and wheezing.   Cardiovascular: Negative for chest pain, palpitations, orthopnea, leg swelling and PND.  Gastrointestinal: Negative for abdominal pain, blood in stool, constipation, diarrhea, heartburn, melena and nausea.  Genitourinary: Negative for dysuria, frequency, hematuria and urgency.  Musculoskeletal: Negative for back pain, joint pain, myalgias and neck pain.  Skin: Negative for rash.  Neurological: Negative for dizziness, tingling, sensory change, focal weakness and headaches.   Endo/Heme/Allergies: Negative for environmental allergies and polydipsia. Does not bruise/bleed easily.  Psychiatric/Behavioral: Negative for depression and suicidal ideas. The patient is not nervous/anxious and does not have insomnia.      Objective  Vitals:   10/17/17 1152  BP: (!) 190/102  Pulse: 80  Weight: 238 lb (108 kg)  Height: 5\' 5"  (1.651 m)    Physical Exam  Constitutional: She is oriented to person, place, and time. She appears well-developed and well-nourished.  HENT:  Head: Normocephalic.  Right Ear: External ear normal.  Left Ear: External ear normal.  Mouth/Throat: Oropharynx is clear and moist.  Eyes: Pupils  are equal, round, and reactive to light. Conjunctivae and EOM are normal. Lids are everted and swept, no foreign bodies found. Left eye exhibits no hordeolum. No foreign body present in the left eye. Right conjunctiva is not injected. Left conjunctiva is not injected. No scleral icterus.  Neck: Normal range of motion. Neck supple. No JVD present. No tracheal deviation present. No thyromegaly present.  Cardiovascular: Normal rate, regular rhythm, normal heart sounds and intact distal pulses. Exam reveals no gallop and no friction rub.  No murmur heard. Pulmonary/Chest: Effort normal and breath sounds normal. No respiratory distress. She has no wheezes. She has no rales.  Abdominal: Soft. Bowel sounds are normal. She exhibits no mass. There is no hepatosplenomegaly. There is no tenderness. There is no rebound and no guarding.  Musculoskeletal: Normal range of motion. She exhibits no edema or tenderness.  Lymphadenopathy:    She has no cervical adenopathy.  Neurological: She is alert and oriented to person, place, and time. She has normal strength. She displays normal reflexes. No cranial nerve deficit.  Skin: Skin is warm. No rash noted.  Psychiatric: She has a normal mood and affect. Her mood appears not anxious. She does not exhibit a depressed mood.  Nursing  note and vitals reviewed.     Assessment & Plan  Problem List Items Addressed This Visit      Cardiovascular and Mediastinum   Essential hypertension - Primary    Acutely elevated in cancer center. Increase losartan 50g bid, HCTZ 12.5 bid, and metoprolol 50 mg bid . Continue amlodipine to q am. Rebcheck bp in 1 week.         No orders of the defined types were placed in this encounter.     Dr. Mac[facility]  10/17/17

## 2017-10-17 NOTE — Progress Notes (Signed)
Hematology/Oncology Consult note [facility]  Telephone:([phone] [phone]  Patient Care Team: [name], MD as PCP - General (Family Medicine)   Name of the patient: Tracey Russo  [id number]  [birth year]   Date of visit: 10/17/17  Diagnosis- h/o secondary polycythemia  Chief complaint/ Reason for visit- discuss results of bloodwork  Heme/Onc history: patient is a 67 year old female with a past medical history significant for hypertension hyperlipidemia type 2 diabetes and GERD among other medical problems.  She states that she was diagnosed with polycythemia and has seen regional cancer care in [place] for this.  She has apparently undergone phlebotomies in the past sometimes every 2 weeks when her hemoglobin was very high.  On looking back at her CBCs since 2009 until present her hemoglobin has always been between 13-14 and in fact she has sometimes been anemic in the past but I do not see any high hemoglobin values.  She has smoked about half a pack of cigarettes per day for 30+ years and quit smoking 4 months ago.  She also has a history of obstructive sleep apnea for which she is on CPAP.  She reports chronic back pain and hip pain as well as chronic fatigue.  She also has tingling numbness in her extremities from diabetes.  She has not had any heart attacks.  Reports she had a mini stroke back in 2004.   Results of blood work from 10/03/2017 were as follows: CBC showed white count of 6, H&H of 13.6/41.1 and a platelet count of 169.CMP was normal except for an elevated blood sugar of 205.  E Po levels were normal at 14.5.  Jak 2 mutation testing was negative  Interval history- she is feeling well today. Denies any complaints other than chronic back pain and joint pain  ECOG PS- 1 Pain scale- 0 Opioid associated constipation- no  Review of systems- Review of Systems  Constitutional: Negative for chills, fever, malaise/fatigue and weight  loss.  HENT: Negative for congestion, ear discharge and nosebleeds.   Eyes: Negative for blurred vision.  Respiratory: Negative for cough, hemoptysis, sputum production, shortness of breath and wheezing.   Cardiovascular: Negative for chest pain, palpitations, orthopnea and claudication.  Gastrointestinal: Negative for abdominal pain, blood in stool, constipation, diarrhea, heartburn, melena, nausea and vomiting.  Genitourinary: Negative for dysuria, flank pain, frequency, hematuria and urgency.  Musculoskeletal: Negative for back pain, joint pain and myalgias.  Skin: Negative for rash.  Neurological: Negative for dizziness, tingling, focal weakness, seizures, weakness and headaches.  Endo/Heme/Allergies: Does not bruise/bleed easily.  Psychiatric/Behavioral: Negative for depression and suicidal ideas. The patient does not have insomnia.       Allergies  Allergen Reactions  . Ace Inhibitors Cough  . Corticosteroids     Other reaction(s): OTHER  . Nsaids     Other reaction(s): ANAPHYLAXIS     Past Medical History:  Diagnosis Date  . Allergy   . Depression   . Diabetes mellitus without complication ([place])   . GERD (gastroesophageal reflux disease)   . Hypertension   . Neuropathy      Past Surgical History:  Procedure Laterality Date  . CERVICAL FUSION  12/07/2000  . COLONOSCOPY  2006   Dr [name]  . CYSTOSTOMY  09/1984  . SHOULDER SURGERY Right 11/2006   tumor removed  . SUBDURAL HEMATOMA EVACUATION VIA CRANIOTOMY  04/16/2007  . TONSILECTOMY, ADENOIDECTOMY, BILATERAL MYRINGOTOMY AND TUBES  03/2001  . VAGINAL HYSTERECTOMY  02/23/2000  Social History   Socioeconomic History  . Marital status: Married    Spouse name: Not on file  . Number of children: Not on file  . Years of education: Not on file  . Highest education level: Not on file  Occupational History  . Not on file  Social Needs  . Financial resource strain: Not on file  . Food insecurity:    Worry:  Not on file    Inability: Not on file  . Transportation needs:    Medical: Not on file    Non-medical: Not on file  Tobacco Use  . Smoking status: Former Research scientist (life sciences)  . Smokeless tobacco: Never Used  Substance and Sexual Activity  . Alcohol use: Yes    Alcohol/week: 0.0 oz  . Drug use: No  . Sexual activity: Yes  Lifestyle  . Physical activity:    Days per week: Not on file    Minutes per session: Not on file  . Stress: Not on file  Relationships  . Social connections:    Talks on phone: Not on file    Gets together: Not on file    Attends religious service: Not on file    Active member of club or organization: Not on file    Attends meetings of clubs or organizations: Not on file    Relationship status: Not on file  . Intimate partner violence:    Fear of current or ex partner: Not on file    Emotionally abused: Not on file    Physically abused: Not on file    Forced sexual activity: Not on file  Other Topics Concern  . Not on file  Social History Narrative  . Not on file    Family History  Problem Relation Age of Onset  . Leukemia Paternal Grandfather      Current Outpatient Medications:  .  amLODipine (NORVASC) 10 MG tablet, Take 1 tablet (10 mg total) by mouth daily., Disp: 90 tablet, Rfl: 1 .  aspirin 81 MG tablet, Take 81 mg by mouth daily., Disp: , Rfl:  .  citalopram (CELEXA) 40 MG tablet, Take 40 mg by mouth daily. Dr [name], Disp: , Rfl:  .  clopidogrel (PLAVIX) 75 MG tablet, Take 1 tablet (75 mg total) by mouth daily., Disp: 90 tablet, Rfl: 1 .  esomeprazole (NEXIUM) 40 MG capsule, Take 1 capsule (40 mg total) by mouth daily., Disp: 90 capsule, Rfl: 1 .  gabapentin (NEURONTIN) 300 MG capsule, Take 1 capsule by mouth every 6 (six) hours as needed. Dr Eddie Dibbles, Disp: , Rfl:  .  HUMALOG 100 UNIT/ML injection, Insulin pump- humalog, Disp: , Rfl:  .  hydrochlorothiazide (MICROZIDE) 12.5 MG capsule, Take 1 capsule (12.5 mg total) by mouth daily., Disp: 90 capsule, Rfl:  1 .  insulin lispro (HUMALOG) 100 UNIT/ML injection, pump, Disp: , Rfl:  .  Liraglutide (VICTOZA) 18 MG/3ML SOPN, Inject 1.8 mg into the skin daily. Dr Eddie Dibbles, Disp: , Rfl:  .  loratadine (CLARITIN) 10 MG tablet, Take 1 tablet (10 mg total) by mouth daily., Disp: 90 tablet, Rfl: 1 .  losartan (COZAAR) 25 MG tablet, Take 1 tablet by mouth daily., Disp: , Rfl:  .  metFORMIN (GLUCOPHAGE) 1000 MG tablet, Take 1 tablet by mouth 2 (two) times daily. Dr Eddie Dibbles, Disp: , Rfl:  .  metoprolol succinate (TOPROL-XL) 50 MG 24 hr tablet, TAKE 1 TABLET BY MOUTH ONCE DAILY WITH  OR  IMMEDIATELY  FOLLOWING  A  MEAL, Disp: 90 tablet,  Rfl: 1 .  mupirocin cream (BACTROBAN) 2 %, Apply 1 application topically 2 (two) times daily., Disp: 30 g, Rfl: 1 .  rosuvastatin (CRESTOR) 20 MG tablet, Take 1 tablet (20 mg total) by mouth daily., Disp: 90 tablet, Rfl: 1 .  varenicline (CHANTIX) 0.5 MG tablet, Take 1 tablet (0.5 mg total) by mouth 2 (two) times daily. (Patient not taking: Reported on 10/03/2017), Disp: 60 tablet, Rfl: 1  Physical exam:  Vitals:   10/17/17 1118  BP: (!) 190/99  Pulse: 78  Resp: 18  Temp: 97.9 F (36.6 C)  TempSrc: Tympanic  SpO2: 98%  Weight: 238 lb 12.1 oz (108.3 kg)  Height: 5\' 5"  (1.651 m)   Physical Exam  Constitutional: She is oriented to person, place, and time. She appears well-developed and well-nourished.  HENT:  Head: Normocephalic and atraumatic.  Eyes: Pupils are equal, round, and reactive to light. EOM are normal.  Neck: Normal range of motion.  Cardiovascular: Normal rate, regular rhythm and normal heart sounds.  Pulmonary/Chest: Effort normal and breath sounds normal.  Abdominal: Soft. Bowel sounds are normal.  Neurological: She is alert and oriented to person, place, and time.  Skin: Skin is warm and dry.     CMP Latest Ref Rng & Units 10/03/2017  Glucose 70 - 99 mg/dL 205(H)  BUN 8 - 23 mg/dL 11  Creatinine 0.44 - 1.00 mg/dL 0.73  Sodium 135 - 145 mmol/L 137  Potassium  3.5 - 5.1 mmol/L 3.7  Chloride 98 - 111 mmol/L 98  CO2 22 - 32 mmol/L 26  Calcium 8.9 - 10.3 mg/dL 8.6(L)  Total Protein 6.5 - 8.1 g/dL 7.7  Total Bilirubin 0.3 - 1.2 mg/dL 0.6  Alkaline Phos 38 - 126 U/L 63  AST 15 - 41 U/L 48(H)  ALT 0 - 44 U/L 41   CBC Latest Ref Rng & Units 10/03/2017  WBC 3.6 - 11.0 K/uL 6.0  Hemoglobin 12.0 - 16.0 g/dL 13.6  Hematocrit 35.0 - 47.0 % 41.1  Platelets 150 - 440 K/uL 169     Assessment and plan- Patient is a 67 y.o. female with history of secondary polycythemia  Results of blood work done in our office does not reveal any evidence of polycythemia.  Her Jak 2 mutation testing is negative and EPO levels are normal.  She therefore does not have any polycythemia vera.  Also her random CBCs in 2009 2016 as well as 2017 and 18 revealed that her hemoglobin has been between 13-14 which is normal.  Moreover patient has stopped smoking 4 months ago.  At this time patient does not require any hematology follow-up.  Her CBC can be monitored by her primary care doctor every 6 months to 1 year.  If her hemoglobin is consistently greater than 16 she can be referred to [place] in the future  Uncontrolled HTN: Repeat BP check showed BP of 188/110.   We will notify Dr. Ronnald Ramp of her BP and blood sugar readings and she is going to see her today after this appointment   Visit Diagnosis 1. History of polycythemia      Dr. Randa Evens, MD, MPH [facility] [phone] 10/17/2017 11:45 AM

## 2017-10-17 NOTE — Progress Notes (Signed)
No new changes noted today 

## 2017-10-25 ENCOUNTER — Ambulatory Visit: Payer: Self-pay | Admitting: Family Medicine

## 2017-10-28 LAB — HEMOGLOBIN A1C: HEMOGLOBIN A1C: 7.5

## 2017-12-06 ENCOUNTER — Ambulatory Visit (INDEPENDENT_AMBULATORY_CARE_PROVIDER_SITE_OTHER): Payer: Medicare Other | Admitting: Family Medicine

## 2017-12-06 ENCOUNTER — Ambulatory Visit
Admission: RE | Admit: 2017-12-06 | Discharge: 2017-12-06 | Disposition: A | Discharge status: Home / Self Care | Payer: Medicare Other | Source: Ambulatory Visit | Attending: Family Medicine | Admitting: Family Medicine

## 2017-12-06 ENCOUNTER — Encounter: Payer: Self-pay | Admitting: Family Medicine

## 2017-12-06 ENCOUNTER — Ambulatory Visit (HOSPITAL_COMMUNITY)
Admission: RE | Admit: 2017-12-06 | Discharge: 2017-12-06 | Disposition: A | Discharge status: Home / Self Care | Payer: Medicare Other | Source: Ambulatory Visit | Attending: Family Medicine | Admitting: Family Medicine

## 2017-12-06 VITALS — BP 124/70 | HR 72 | Ht 65.0 in | Wt 239.0 lb

## 2017-12-06 DIAGNOSIS — W19XXXA Unspecified fall, initial encounter: Secondary | ICD-10-CM

## 2017-12-06 DIAGNOSIS — S92911A Unspecified fracture of right toe(s), initial encounter for closed fracture: Secondary | ICD-10-CM | POA: Insufficient documentation

## 2017-12-06 DIAGNOSIS — S99921A Unspecified injury of right foot, initial encounter: Secondary | ICD-10-CM | POA: Insufficient documentation

## 2017-12-06 DIAGNOSIS — Z23 Encounter for immunization: Secondary | ICD-10-CM

## 2017-12-06 DIAGNOSIS — R079 Chest pain, unspecified: Secondary | ICD-10-CM | POA: Diagnosis not present

## 2017-12-06 DIAGNOSIS — J301 Allergic rhinitis due to pollen: Secondary | ICD-10-CM

## 2017-12-06 DIAGNOSIS — Z0000 Encounter for general adult medical examination without abnormal findings: Secondary | ICD-10-CM | POA: Diagnosis not present

## 2017-12-06 MED ORDER — CETIRIZINE HCL 10 MG PO TABS: 10.0000 mg | ORAL_TABLET | Freq: Every day | ORAL | 11 refills | Status: DC

## 2017-12-06 MED ORDER — TRAMADOL HCL 50 MG PO TABS: 50.0000 mg | ORAL_TABLET | Freq: Three times a day (TID) | ORAL | 0 refills | Status: AC | PRN

## 2017-12-06 NOTE — Progress Notes (Addendum)
Name: Tracey Russo   MRN: [id number]    DOB: 1950/07/21   Date:12/06/2017       Progress Note  Subjective  Chief Complaint  Chief Complaint  Patient presents with  . Annual Exam    Patient is a 67 year old female who presents for a comprehensive physical exam. The patient reports the following problems: fall/foot pain/chest pain. Health maintenance has been reviewed dexascan / influenza  Fall  The accident occurred 2 days ago. Fall occurred: reach to turn lamp off. Impact surface: across chair. There was no blood loss. Point of impact: twisted foot. The pain is moderate. The symptoms are aggravated by movement. Pertinent negatives include no abdominal pain, fever, headaches, hematuria, nausea or tingling. She has tried NSAID for the symptoms. The treatment provided moderate relief.  Chest Pain   This is a new problem. The current episode started more than 1 year ago. The onset quality is gradual. The problem occurs intermittently. The problem has been unchanged. The pain is present in the substernal region. The pain is at a severity of 3/10. The pain is moderate. The quality of the pain is described as tightness and heavy. The pain does not radiate. Associated symptoms include orthopnea, PND and shortness of breath. Pertinent negatives include no abdominal pain, back pain, cough, dizziness, fever, headaches, malaise/fatigue, nausea, palpitations or sputum production. She has tried rest (lie down with c pap) for the symptoms. The treatment provided moderate relief. Risk factors: diabetes/htn.    No problem-specific Assessment & Plan notes found for this encounter.   Past Medical History:  Diagnosis Date  . Allergy   . Depression   . Diabetes mellitus without complication ([place])   . GERD (gastroesophageal reflux disease)   . Hypertension   . Neuropathy     Past Surgical History:  Procedure Laterality Date  . CERVICAL FUSION  12/07/2000  . COLONOSCOPY  2006   Dr [name]  .  CYSTOSTOMY  09/1984  . SHOULDER SURGERY Right 11/2006   tumor removed  . SUBDURAL HEMATOMA EVACUATION VIA CRANIOTOMY  04/16/2007  . TONSILECTOMY, ADENOIDECTOMY, BILATERAL MYRINGOTOMY AND TUBES  03/2001  . VAGINAL HYSTERECTOMY  02/23/2000    Family History  Problem Relation Age of Onset  . Leukemia Paternal Grandfather     Social History   Socioeconomic History  . Marital status: Married    Spouse name: Not on file  . Number of children: Not on file  . Years of education: Not on file  . Highest education level: Not on file  Occupational History  . Not on file  Social Needs  . Financial resource strain: Not on file  . Food insecurity:    Worry: Not on file    Inability: Not on file  . Transportation needs:    Medical: Not on file    Non-medical: Not on file  Tobacco Use  . Smoking status: Former Research scientist (life sciences)  . Smokeless tobacco: Never Used  Substance and Sexual Activity  . Alcohol use: Yes    Alcohol/week: 0.0 standard drinks  . Drug use: No  . Sexual activity: Yes  Lifestyle  . Physical activity:    Days per week: Not on file    Minutes per session: Not on file  . Stress: Not on file  Relationships  . Social connections:    Talks on phone: Not on file    Gets together: Not on file    Attends religious service: Not on file    Active member of  club or organization: Not on file    Attends meetings of clubs or organizations: Not on file    Relationship status: Not on file  . Intimate partner violence:    Fear of current or ex partner: Not on file    Emotionally abused: Not on file    Physically abused: Not on file    Forced sexual activity: Not on file  Other Topics Concern  . Not on file  Social History Narrative  . Not on file    Allergies  Allergen Reactions  . Ace Inhibitors Cough  . Corticosteroids     Other reaction(s): OTHER  . Nsaids     Other reaction(s): ANAPHYLAXIS    Outpatient Medications Prior to Visit  Medication Sig Dispense Refill  .  amLODipine (NORVASC) 10 MG tablet Take 1 tablet (10 mg total) by mouth daily. 90 tablet 1  . aspirin 81 MG tablet Take 81 mg by mouth daily.    . cetirizine (ZYRTEC) 10 MG tablet Take 10 mg by mouth daily.    . citalopram (CELEXA) 40 MG tablet Take 40 mg by mouth daily. Dr [name]    . clopidogrel (PLAVIX) 75 MG tablet Take 1 tablet (75 mg total) by mouth daily. 90 tablet 1  . esomeprazole (NEXIUM) 40 MG capsule Take 1 capsule (40 mg total) by mouth daily. 90 capsule 1  . gabapentin (NEURONTIN) 300 MG capsule Take 1 capsule by mouth every 6 (six) hours as needed. Dr Eddie Dibbles    . HUMALOG 100 UNIT/ML injection Insulin pump- humalog    . hydrochlorothiazide (MICROZIDE) 12.5 MG capsule Take 1 capsule (12.5 mg total) by mouth daily. 90 capsule 1  . insulin lispro (HUMALOG) 100 UNIT/ML injection pump    . Liraglutide (VICTOZA) 18 MG/3ML SOPN Inject 1.8 mg into the skin daily. Dr Eddie Dibbles    . losartan (COZAAR) 25 MG tablet Take 1 tablet by mouth daily.    . metFORMIN (GLUCOPHAGE) 1000 MG tablet Take 1 tablet by mouth 2 (two) times daily. Dr Eddie Dibbles    . metoprolol succinate (TOPROL-XL) 50 MG 24 hr tablet TAKE 1 TABLET BY MOUTH ONCE DAILY WITH  OR  IMMEDIATELY  FOLLOWING  A  MEAL 90 tablet 1  . mupirocin cream (BACTROBAN) 2 % Apply 1 application topically 2 (two) times daily. 30 g 1  . rosuvastatin (CRESTOR) 20 MG tablet Take 1 tablet (20 mg total) by mouth daily. 90 tablet 1  . loratadine (CLARITIN) 10 MG tablet Take 1 tablet (10 mg total) by mouth daily. 90 tablet 1  . varenicline (CHANTIX) 0.5 MG tablet Take 1 tablet (0.5 mg total) by mouth 2 (two) times daily. 60 tablet 1   No facility-administered medications prior to visit.     Review of Systems  Constitutional: Negative for chills, fever, malaise/fatigue and weight loss.  HENT: Negative for ear discharge, ear pain and sore throat.   Eyes: Negative for blurred vision.  Respiratory: Positive for shortness of breath. Negative for cough, sputum production  and wheezing.   Cardiovascular: Positive for chest pain, orthopnea and PND. Negative for palpitations and leg swelling.  Gastrointestinal: Negative for abdominal pain, blood in stool, constipation, diarrhea, heartburn, melena and nausea.  Genitourinary: Negative for dysuria, frequency, hematuria and urgency.  Musculoskeletal: Negative for back pain, joint pain, myalgias and neck pain.  Skin: Negative for rash.  Neurological: Negative for dizziness, tingling, sensory change, focal weakness and headaches.  Endo/Heme/Allergies: Negative for environmental allergies and polydipsia. Does not bruise/bleed easily.  Psychiatric/Behavioral: Negative for depression and suicidal ideas. The patient is not nervous/anxious and does not have insomnia.      Objective  Vitals:   12/06/17 0957  BP: 124/70  Pulse: 72  Weight: 239 lb (108.4 kg)  Height: 5\' 5"  (1.651 m)    Physical Exam  Constitutional: No distress.  HENT:  Head: Normocephalic and atraumatic.  Right Ear: External ear normal.  Left Ear: External ear normal.  Nose: Nose normal.  Mouth/Throat: Oropharynx is clear and moist.  Eyes: Pupils are equal, round, and reactive to light. Conjunctivae and EOM are normal. Right eye exhibits no discharge. Left eye exhibits no discharge.  Neck: Normal range of motion. Neck supple. No JVD present. No thyromegaly present.  Cardiovascular: Normal rate, regular rhythm, normal heart sounds and intact distal pulses. Exam reveals no gallop and no friction rub.  No murmur heard. Pulmonary/Chest: Effort normal and breath sounds normal.  Abdominal: Soft. Bowel sounds are normal. She exhibits no mass. There is no tenderness. There is no guarding.  Musculoskeletal: Normal range of motion. She exhibits no edema.       Right foot: There is bony tenderness.  Ecchymose/tender 3rd/4th metatarsal  Lymphadenopathy:    She has no cervical adenopathy.  Neurological: She is alert. She has normal reflexes.  Skin: Skin  is warm and dry. She is not diaphoretic.  Nursing note and vitals reviewed.     Assessment & Plan  Problem List Items Addressed This Visit    None    Visit Diagnoses    Annual physical exam    -  Primary   bruised R) foot s/p fall   Chest pain, unspecified type       EKG ran/ referral to cardio for further eval   Relevant Orders   EKG 12-Lead (Completed)   Ambulatory referral to Cardiology   Fall, initial encounter       xray of R) foot/ prescribed tramadol for pain from fall   Relevant Orders   DG Foot Complete Right   Foot injury, right, initial encounter       s/p fall/ xray ordered and tramadol prescribed   Relevant Medications   traMADol (ULTRAM) 50 MG tablet   Other Relevant Orders   DG Foot Complete Right   Seasonal allergic rhinitis due to pollen       changed from loratadine to zyrtec   Relevant Medications   cetirizine (ZYRTEC) 10 MG tablet   Influenza vaccine needed       administered   Relevant Orders   Flu vaccine HIGH DOSE PF (Completed)    I spent 50 minutes with this patient, More than 50% of that time was spent in face to face education, counseling and care coordination.  Meds ordered this encounter  Medications  . traMADol (ULTRAM) 50 MG tablet    Sig: Take 1 tablet (50 mg total) by mouth every 8 (eight) hours as needed for up to 5 days.    Dispense:  15 tablet    Refill:  0  . cetirizine (ZYRTEC) 10 MG tablet    Sig: Take 1 tablet (10 mg total) by mouth daily.    Dispense:  30 tablet    Refill:  11   I have reviewed EKG which shows ST depression in precorrdial leads. Comparison to previous EKG dated 06/21/16 noted no change on comparison. No other signs of ischemia noted. DJ   Dr. [facility]  12/06/17

## 2017-12-07 ENCOUNTER — Other Ambulatory Visit: Payer: Self-pay

## 2017-12-07 DIAGNOSIS — S99921A Unspecified injury of right foot, initial encounter: Secondary | ICD-10-CM

## 2017-12-07 DIAGNOSIS — S92501A Displaced unspecified fracture of right lesser toe(s), initial encounter for closed fracture: Secondary | ICD-10-CM

## 2017-12-13 DIAGNOSIS — I208 Other forms of angina pectoris: Secondary | ICD-10-CM | POA: Insufficient documentation

## 2017-12-13 DIAGNOSIS — I6523 Occlusion and stenosis of bilateral carotid arteries: Secondary | ICD-10-CM | POA: Insufficient documentation

## 2017-12-29 ENCOUNTER — Other Ambulatory Visit: Payer: Self-pay

## 2017-12-29 DIAGNOSIS — Z780 Asymptomatic menopausal state: Secondary | ICD-10-CM

## 2017-12-29 DIAGNOSIS — Z1211 Encounter for screening for malignant neoplasm of colon: Secondary | ICD-10-CM

## 2017-12-29 DIAGNOSIS — Z1231 Encounter for screening mammogram for malignant neoplasm of breast: Secondary | ICD-10-CM

## 2018-01-11 ENCOUNTER — Other Ambulatory Visit: Payer: Medicare Other

## 2018-01-11 ENCOUNTER — Ambulatory Visit: Payer: Medicare Other

## 2018-01-12 ENCOUNTER — Inpatient Hospital Stay: Admission: RE | Admit: 2018-01-12 | Payer: Medicare Other | Source: Ambulatory Visit

## 2018-01-12 ENCOUNTER — Other Ambulatory Visit: Payer: Medicare Other

## 2018-01-16 ENCOUNTER — Other Ambulatory Visit: Payer: Self-pay | Admitting: Family Medicine

## 2018-01-16 DIAGNOSIS — K219 Gastro-esophageal reflux disease without esophagitis: Secondary | ICD-10-CM

## 2018-01-27 ENCOUNTER — Other Ambulatory Visit: Payer: Self-pay | Admitting: Family Medicine

## 2018-01-27 DIAGNOSIS — I679 Cerebrovascular disease, unspecified: Secondary | ICD-10-CM

## 2018-01-27 DIAGNOSIS — E785 Hyperlipidemia, unspecified: Secondary | ICD-10-CM

## 2018-01-27 DIAGNOSIS — I10 Essential (primary) hypertension: Secondary | ICD-10-CM

## 2018-02-03 DIAGNOSIS — E559 Vitamin D deficiency, unspecified: Secondary | ICD-10-CM | POA: Insufficient documentation

## 2018-04-24 ENCOUNTER — Encounter: Payer: Self-pay | Admitting: Family Medicine

## 2018-04-24 ENCOUNTER — Ambulatory Visit: Payer: Medicare Other | Admitting: Family Medicine

## 2018-04-24 VITALS — BP 120/80 | HR 60 | Ht 65.0 in | Wt 234.0 lb

## 2018-04-24 DIAGNOSIS — K760 Fatty (change of) liver, not elsewhere classified: Secondary | ICD-10-CM

## 2018-04-24 DIAGNOSIS — N3000 Acute cystitis without hematuria: Secondary | ICD-10-CM

## 2018-04-24 DIAGNOSIS — R1011 Right upper quadrant pain: Secondary | ICD-10-CM

## 2018-04-24 DIAGNOSIS — B370 Candidal stomatitis: Secondary | ICD-10-CM

## 2018-04-24 LAB — POCT URINALYSIS DIPSTICK
Bilirubin, UA: NEGATIVE
Glucose, UA: POSITIVE — AB
Ketones, UA: POSITIVE
Nitrite, UA: POSITIVE
Protein, UA: POSITIVE — AB
Urobilinogen, UA: 0.2 E.U./dL
pH, UA: 5 (ref 5.0–8.0)

## 2018-04-24 MED ORDER — DOXYCYCLINE HYCLATE 100 MG PO TABS: 100.0000 mg | ORAL_TABLET | Freq: Two times a day (BID) | ORAL | 0 refills | Status: DC

## 2018-04-24 MED ORDER — NYSTATIN 100000 UNIT/ML MT SUSP: 5.0000 mL | Freq: Four times a day (QID) | OROMUCOSAL | 0 refills | Status: DC

## 2018-04-24 NOTE — Progress Notes (Signed)
Date:  04/24/2018   Name:  Tracey Russo   DOB:  21-Sep-1950   MRN:  [id number]   Chief Complaint: Oral Pain (pain inside mouth, gums and lips. Biting her tongue because "I can't feel it on the left side" Associated with sore throat); Ear Pain (a tenderness down in L) ear); and Urinary Tract Infection  Oral Pain   This is a new problem. The current episode started more than 1 month ago. The problem has been waxing and waning. The pain is at a severity of 4/10. The pain is moderate. Pertinent negatives include no difficulty swallowing, facial pain, fever, oral bleeding, sinus pressure or thermal sensitivity. She has tried nothing for the symptoms.  Urinary Tract Infection   This is a new problem. The current episode started in the past 7 days. The problem occurs every urination. The quality of the pain is described as burning. There has been no fever. Pertinent negatives include no chills, discharge, flank pain, frequency, hematuria, hesitancy, nausea, possible pregnancy, sweats, urgency or vomiting. She has tried nothing for the symptoms. Her past medical history is significant for kidney stones. There is no history of recurrent UTIs or a single kidney.    Review of Systems  Constitutional: Negative.  Negative for chills, fatigue, fever and unexpected weight change.  HENT: Negative for congestion, ear discharge, ear pain, rhinorrhea, sinus pressure, sneezing and sore throat.   Eyes: Negative for photophobia, pain, discharge, redness and itching.  Respiratory: Negative for cough, shortness of breath, wheezing and stridor.   Gastrointestinal: Negative for abdominal pain, blood in stool, constipation, diarrhea, nausea and vomiting.  Endocrine: Negative for cold intolerance, heat intolerance, polydipsia, polyphagia and polyuria.  Genitourinary: Negative for dysuria, flank pain, frequency, hematuria, hesitancy, menstrual problem, pelvic pain, urgency, vaginal bleeding and vaginal discharge.   Musculoskeletal: Negative for arthralgias, back pain and myalgias.  Skin: Negative for rash.  Allergic/Immunologic: Negative for environmental allergies and food allergies.  Neurological: Negative for dizziness, weakness, light-headedness, numbness and headaches.  Hematological: Negative for adenopathy. Does not bruise/bleed easily.  Psychiatric/Behavioral: Negative for dysphoric mood. The patient is not nervous/anxious.     Patient Active Problem List   Diagnosis Date Noted  . Recurrent major depressive disorder ([place]) 10/17/2017  . Mixed hyperlipidemia 12/22/2015  . Acute seasonal allergic rhinitis 12/22/2015  . Acute maxillary sinusitis 12/22/2015  . Incomplete bladder emptying 02/05/2014  . Dyslipidemia 08/09/2013  . Gastroesophageal reflux disease 08/09/2013  . Essential hypertension 08/09/2013  . Diabetes mellitus, type 2 ([place]) 08/09/2013  . Acquired cyst of kidney 09/11/2012  . 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 Urge incontinence 09/11/2012  . Frank hematuria 05/08/2012  . Mixed incontinence 05/08/2012  . Renal colic 14/48/1856    Allergies  Allergen Reactions  . Ace Inhibitors Cough  . Corticosteroids     Other reaction(s): OTHER  . Nsaids     Other reaction(s): ANAPHYLAXIS    Past Surgical History:  Procedure Laterality Date  . CERVICAL FUSION  12/07/2000  . COLONOSCOPY  2006   Dr [name]  . CYSTOSTOMY  09/1984  . SHOULDER SURGERY Right 11/2006   tumor removed  . SUBDURAL HEMATOMA EVACUATION VIA CRANIOTOMY  04/16/2007  . TONSILECTOMY, ADENOIDECTOMY, BILATERAL MYRINGOTOMY AND TUBES  03/2001  . VAGINAL HYSTERECTOMY  02/23/2000    Social History   Tobacco Use  . Smoking status: Former Research scientist (life sciences)  . Smokeless tobacco: Never Used  Substance Use Topics  . Alcohol use: Yes    Alcohol/week: 0.0 standard  drinks  . Drug use: No     Medication list has been reviewed and updated.  Current Meds  Medication Sig  . amLODipine (NORVASC) 10 MG  tablet TAKE 1 TABLET BY MOUTH ONCE DAILY  . aspirin 81 MG tablet Take 81 mg by mouth daily.  . cetirizine (ZYRTEC) 10 MG tablet Take 10 mg by mouth daily.  . citalopram (CELEXA) 40 MG tablet Take 40 mg by mouth daily. Dr [name]  . clopidogrel (PLAVIX) 75 MG tablet TAKE 1 TABLET BY MOUTH ONCE DAILY  . gabapentin (NEURONTIN) 300 MG capsule Take 1 capsule by mouth every 6 (six) hours as needed. Dr Eddie Dibbles  . HUMALOG 100 UNIT/ML injection Insulin pump- humalog  . hydrochlorothiazide (MICROZIDE) 12.5 MG capsule TAKE 1 CAPSULE BY MOUTH ONCE DAILY  . Liraglutide (VICTOZA) 18 MG/3ML SOPN Inject 1.8 mg into the skin daily. Dr Eddie Dibbles  . losartan (COZAAR) 25 MG tablet Take 1 tablet by mouth daily.  . metFORMIN (GLUCOPHAGE) 1000 MG tablet Take 1 tablet by mouth 2 (two) times daily. Dr Eddie Dibbles  . metoprolol succinate (TOPROL-XL) 50 MG 24 hr tablet TAKE 1 TABLET BY MOUTH ONCE DAILY WITH OR IMMEDIATELY FOLLOWING A  MEALS  . mupirocin cream (BACTROBAN) 2 % Apply 1 application topically 2 (two) times daily.  . rosuvastatin (CRESTOR) 20 MG tablet TAKE 1 TABLET BY MOUTH ONCE DAILY  . [DISCONTINUED] esomeprazole (NEXIUM) 40 MG capsule TAKE 1 CAPSULE BY MOUTH ONCE DAILY    PHQ 2/9 Scores 04/24/2018 06/02/2017 05/05/2015 12/16/2014  PHQ - 2 Score 1 0 0 0  PHQ- 9 Score 2 4 - -    Physical Exam Vitals signs and nursing note reviewed.  Constitutional:      General: She is not in acute distress.    Appearance: She is not diaphoretic.  HENT:     Head: Normocephalic and atraumatic.     Right Ear: External ear normal.     Left Ear: External ear normal.     Nose: Nose normal.  Eyes:     General:        Right eye: No discharge.        Left eye: No discharge.     Conjunctiva/sclera: Conjunctivae normal.     Pupils: Pupils are equal, round, and reactive to light.  Neck:     Musculoskeletal: Normal range of motion and neck supple.     Thyroid: No thyromegaly.     Vascular: No JVD.  Cardiovascular:     Rate and Rhythm:  Normal rate and regular rhythm.     Heart sounds: Normal heart sounds. No murmur. No friction rub. No gallop.   Pulmonary:     Effort: Pulmonary effort is normal.     Breath sounds: Normal breath sounds.  Abdominal:     General: Bowel sounds are normal. There is no distension.     Palpations: Abdomen is soft. There is no mass.     Tenderness: There is abdominal tenderness. There is no right CVA tenderness, left CVA tenderness, guarding or rebound.  Musculoskeletal: Normal range of motion.  Lymphadenopathy:     Cervical: No cervical adenopathy.  Skin:    General: Skin is warm and dry.  Neurological:     Mental Status: She is alert.     Deep Tendon Reflexes: Reflexes are normal and symmetric.     BP 120/80   Pulse 60   Ht 5\' 5"  (1.651 m)   Wt 234 lb (106.1 kg)   BMI 38.94  kg/m   Assessment and Plan:  1. Oral candidiasis She has oral discomfort with areas of irritation generalized in the oral cavity.  When initiate nystatin oral suspension for the possibility of candidiasis. - nystatin (MYCOSTATIN) 100000 UNIT/ML suspension; Take 5 mLs (500,000 Units total) by mouth 4 (four) times daily.  Dispense: 60 mL; Refill: 0  2. Steatosis of liver And has a history of steatosis of the liver for which Dr. Eddie Dibbles was going to evaluate patient has had pain in the right upper quadrant and urobilinogen were as noted on urinalysis.  Will do an ultrasound of the right upper quadrant and obtain hepatic function panel with lipase.  Patient does have a history of alcohol intake and pancreatitis is a concern. - Hepatic Function Panel (6) - Lipase - US Abdomen Limited RUQ; Future  3. Right upper quadrant abdominal pain Patient has had right upper quadrant abdominal pain for about a week that is been increasing.  Patient has tenderness in the right upper quadrant and lipase will be obtained to evaluate possibility of pancreatitis. - US Abdomen Limited RUQ; Future  4. Acute cystitis without  hematuria Patient has urinary frequency with urgency and urine shows hematuria with nitrate and sites.  Will initiate doxycycline 100 mg twice a day - doxycycline (VIBRA-TABS) 100 MG tablet; Take 1 tablet (100 mg total) by mouth 2 (two) times daily.  Dispense: 20 tablet; Refill: 0 - POCT urinalysis dipstick

## 2018-04-25 ENCOUNTER — Ambulatory Visit: Payer: Self-pay | Admitting: Family Medicine

## 2018-04-25 LAB — HEPATIC FUNCTION PANEL (6)
ALT: 40 IU/L — ABNORMAL HIGH (ref 0–32)
AST: 42 IU/L — ABNORMAL HIGH (ref 0–40)
Albumin: 4.7 g/dL (ref 3.8–4.8)
Alkaline Phosphatase: 102 IU/L (ref 39–117)
Bilirubin Total: 0.5 mg/dL (ref 0.0–1.2)
Bilirubin, Direct: 0.15 mg/dL (ref 0.00–0.40)

## 2018-04-25 LAB — LIPASE: Lipase: 27 U/L (ref 14–72)

## 2018-05-01 ENCOUNTER — Ambulatory Visit
Admission: RE | Admit: 2018-05-01 | Discharge: 2018-05-01 | Disposition: A | Discharge status: Home / Self Care | Payer: Medicare Other | Source: Ambulatory Visit | Attending: Family Medicine | Admitting: Family Medicine

## 2018-05-01 DIAGNOSIS — R1011 Right upper quadrant pain: Secondary | ICD-10-CM | POA: Diagnosis present

## 2018-05-01 DIAGNOSIS — K760 Fatty (change of) liver, not elsewhere classified: Secondary | ICD-10-CM | POA: Diagnosis not present

## 2018-05-02 DIAGNOSIS — E669 Obesity, unspecified: Secondary | ICD-10-CM | POA: Insufficient documentation

## 2018-05-17 ENCOUNTER — Other Ambulatory Visit: Payer: Self-pay | Admitting: Family Medicine

## 2018-05-17 DIAGNOSIS — K219 Gastro-esophageal reflux disease without esophagitis: Secondary | ICD-10-CM

## 2018-07-03 ENCOUNTER — Telehealth: Payer: Self-pay

## 2018-07-03 NOTE — Telephone Encounter (Signed)
Left message for patient regarding need for AWV and available to complete via video chat. Requested patient to contact the office at [phone] or contact me directly at [phone] to schedule virtual visit.   Virtual Medicare Annual wellness visits scheduled tentatively only through May 2020 during COVID-19 pandemic.     Thank you

## 2018-08-01 ENCOUNTER — Ambulatory Visit: Payer: Medicare Other | Admitting: Family Medicine

## 2018-08-02 ENCOUNTER — Ambulatory Visit
Admission: RE | Admit: 2018-08-02 | Discharge: 2018-08-02 | Disposition: A | Discharge status: Home / Self Care | Payer: Medicare Other | Attending: Family Medicine | Admitting: Family Medicine

## 2018-08-02 ENCOUNTER — Encounter: Payer: Self-pay | Admitting: Family Medicine

## 2018-08-02 ENCOUNTER — Ambulatory Visit (INDEPENDENT_AMBULATORY_CARE_PROVIDER_SITE_OTHER): Payer: Medicare Other | Admitting: Family Medicine

## 2018-08-02 ENCOUNTER — Ambulatory Visit
Admission: RE | Admit: 2018-08-02 | Discharge: 2018-08-02 | Disposition: A | Discharge status: Home / Self Care | Payer: Medicare Other | Source: Ambulatory Visit | Attending: Family Medicine | Admitting: Family Medicine

## 2018-08-02 ENCOUNTER — Other Ambulatory Visit: Payer: Self-pay

## 2018-08-02 VITALS — BP 144/100 | HR 88 | Ht 65.0 in | Wt 235.0 lb

## 2018-08-02 DIAGNOSIS — R0789 Other chest pain: Secondary | ICD-10-CM | POA: Diagnosis not present

## 2018-08-02 DIAGNOSIS — E119 Type 2 diabetes mellitus without complications: Secondary | ICD-10-CM | POA: Diagnosis not present

## 2018-08-02 DIAGNOSIS — R079 Chest pain, unspecified: Secondary | ICD-10-CM

## 2018-08-02 DIAGNOSIS — Z8679 Personal history of other diseases of the circulatory system: Secondary | ICD-10-CM

## 2018-08-02 DIAGNOSIS — I10 Essential (primary) hypertension: Secondary | ICD-10-CM

## 2018-08-02 DIAGNOSIS — E785 Hyperlipidemia, unspecified: Secondary | ICD-10-CM

## 2018-08-02 MED ORDER — TRAMADOL HCL 50 MG PO TABS: 50.0000 mg | ORAL_TABLET | Freq: Four times a day (QID) | ORAL | 0 refills | Status: AC | PRN

## 2018-08-02 NOTE — Progress Notes (Signed)
Date:  08/02/2018   Name:  Tracey Russo   DOB:  1950/12/27   MRN:  [id number]   Chief Complaint: Chest Pain (x 1 week- hurting in middle of chest to the back- was supposed to follow up with cardio in November)  Chest Pain   This is a new problem. The current episode started 1 to 4 weeks ago (10 days). The onset quality is sudden. The problem occurs constantly. The problem has been gradually worsening. The pain is present in the substernal region. The pain is at a severity of 9/10 (3 to 9 (with activity)). The pain is severe. The quality of the pain is described as tightness. The pain radiates to the right shoulder and upper back. Associated symptoms include diaphoresis, dizziness, exertional chest pressure, nausea and shortness of breath. Pertinent negatives include no abdominal pain, back pain, cough, fever, headaches, numbness, orthopnea, palpitations, vomiting or weakness. The pain is aggravated by exertion and lifting arm. She has tried NSAIDs (mild relief) for the symptoms. The treatment provided moderate relief. Risk factors include post-menopausal.    Review of Systems  Constitutional: Positive for diaphoresis. Negative for chills, fatigue, fever and unexpected weight change.  HENT: Negative for congestion, ear discharge, ear pain, rhinorrhea, sinus pressure, sneezing and sore throat.   Eyes: Negative for photophobia, pain, discharge, redness and itching.  Respiratory: Positive for shortness of breath. Negative for cough, wheezing and stridor.   Cardiovascular: Positive for chest pain. Negative for palpitations and orthopnea.  Gastrointestinal: Positive for nausea. Negative for abdominal pain, blood in stool, constipation, diarrhea and vomiting.  Endocrine: Negative for cold intolerance, heat intolerance, polydipsia, polyphagia and polyuria.  Genitourinary: Negative for dysuria, flank pain, frequency, hematuria, menstrual problem, pelvic pain, urgency, vaginal bleeding and vaginal  discharge.  Musculoskeletal: Negative for arthralgias, back pain and myalgias.  Skin: Negative for rash.  Allergic/Immunologic: Negative for environmental allergies and food allergies.  Neurological: Positive for dizziness. Negative for weakness, light-headedness, numbness and headaches.  Hematological: Negative for adenopathy. Does not bruise/bleed easily.  Psychiatric/Behavioral: Negative for dysphoric mood. The patient is not nervous/anxious.     Patient Active Problem List   Diagnosis Date Noted  . Recurrent major depressive disorder ([place]) 10/17/2017  . Mixed hyperlipidemia 12/22/2015  . Acute seasonal allergic rhinitis 12/22/2015  . Acute maxillary sinusitis 12/22/2015  . Incomplete bladder emptying 02/05/2014  . Dyslipidemia 08/09/2013  . Gastroesophageal reflux disease 08/09/2013  . Essential hypertension 08/09/2013  . Diabetes mellitus, type 2 ([place]) 08/09/2013  . Acquired cyst of kidney 09/11/2012  . 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 Urge incontinence 09/11/2012  . Frank hematuria 05/08/2012  . Mixed incontinence 05/08/2012  . Renal colic 27/05/5007    Allergies  Allergen Reactions  . Ace Inhibitors Cough  . Corticosteroids     Other reaction(s): OTHER  . Nsaids     Other reaction(s): ANAPHYLAXIS    Past Surgical History:  Procedure Laterality Date  . CERVICAL FUSION  12/07/2000  . COLONOSCOPY  2006   Dr [name]  . CYSTOSTOMY  09/1984  . SHOULDER SURGERY Right 11/2006   tumor removed  . SUBDURAL HEMATOMA EVACUATION VIA CRANIOTOMY  04/16/2007  . TONSILECTOMY, ADENOIDECTOMY, BILATERAL MYRINGOTOMY AND TUBES  03/2001  . VAGINAL HYSTERECTOMY  02/23/2000    Social History   Tobacco Use  . Smoking status: Former Research scientist (life sciences)  . Smokeless tobacco: Never Used  Substance Use Topics  . Alcohol use: Yes    Alcohol/week: 0.0 standard drinks  . Drug use:  No     Medication list has been reviewed and updated.  Current Meds  Medication Sig  .  amLODipine (NORVASC) 10 MG tablet TAKE 1 TABLET BY MOUTH ONCE DAILY  . aspirin 81 MG tablet Take 81 mg by mouth daily.  . cetirizine (ZYRTEC) 10 MG tablet Take 10 mg by mouth daily.  . citalopram (CELEXA) 40 MG tablet Take 40 mg by mouth daily. Dr [name]  . clopidogrel (PLAVIX) 75 MG tablet TAKE 1 TABLET BY MOUTH ONCE DAILY  . esomeprazole (NEXIUM) 40 MG capsule TAKE 1 CAPSULE BY MOUTH ONCE DAILY  . gabapentin (NEURONTIN) 300 MG capsule Take 1 capsule by mouth every 6 (six) hours as needed. Dr Eddie Dibbles  . HUMALOG 100 UNIT/ML injection Insulin pump- humalog  . hydrochlorothiazide (MICROZIDE) 12.5 MG capsule TAKE 1 CAPSULE BY MOUTH ONCE DAILY  . Liraglutide (VICTOZA) 18 MG/3ML SOPN Inject 1.8 mg into the skin daily. Dr Eddie Dibbles  . losartan (COZAAR) 25 MG tablet Take 1 tablet by mouth daily.  . metFORMIN (GLUCOPHAGE) 1000 MG tablet Take 1 tablet by mouth 2 (two) times daily. Dr Eddie Dibbles  . metoprolol succinate (TOPROL-XL) 50 MG 24 hr tablet TAKE 1 TABLET BY MOUTH ONCE DAILY WITH OR IMMEDIATELY FOLLOWING A  MEALS  . mupirocin cream (BACTROBAN) 2 % Apply 1 application topically 2 (two) times daily.  Marland Kitchen nystatin (MYCOSTATIN) 100000 UNIT/ML suspension Take 5 mLs (500,000 Units total) by mouth 4 (four) times daily.  . rosuvastatin (CRESTOR) 20 MG tablet TAKE 1 TABLET BY MOUTH ONCE DAILY    PHQ 2/9 Scores 04/24/2018 06/02/2017 05/05/2015 12/16/2014  PHQ - 2 Score 1 0 0 0  PHQ- 9 Score 2 4 - -    BP Readings from Last 3 Encounters:  08/02/18 (!) 144/100  04/24/18 120/80  12/06/17 124/70    Physical Exam Constitutional:      General: She is not in acute distress.    Appearance: She is not diaphoretic.  HENT:     Head: Normocephalic and atraumatic.     Right Ear: External ear normal.     Left Ear: External ear normal.     Nose: Nose normal.  Eyes:     General:        Right eye: No discharge.        Left eye: No discharge.     Conjunctiva/sclera: Conjunctivae normal.     Pupils: Pupils are equal, round,  and reactive to light.  Neck:     Musculoskeletal: Normal range of motion and neck supple.     Thyroid: No thyromegaly.     Vascular: No JVD.  Cardiovascular:     Rate and Rhythm: Normal rate and regular rhythm.     Chest Wall: PMI is not displaced.     Heart sounds: Normal heart sounds, S1 normal and S2 normal. No murmur. No systolic murmur. No diastolic murmur. No friction rub. No gallop. No S3 or S4 sounds.   Pulmonary:     Effort: Pulmonary effort is normal.     Breath sounds: Normal breath sounds.  Chest:     Chest wall: Tenderness present. No mass.       Comments: Ecchymosis/yellow/equisitely tender Abdominal:     General: Bowel sounds are normal.     Palpations: Abdomen is soft. There is no mass.     Tenderness: There is no abdominal tenderness. There is no guarding.  Musculoskeletal: Normal range of motion.  Lymphadenopathy:     Cervical: No cervical adenopathy.  Skin:  General: Skin is warm and dry.  Neurological:     Mental Status: She is alert.     Deep Tendon Reflexes: Reflexes are normal and symmetric.     Wt Readings from Last 3 Encounters:  08/02/18 235 lb (106.6 kg)  04/24/18 234 lb (106.1 kg)  12/06/17 239 lb (108.4 kg)    BP (!) 144/100   Pulse 88   Ht 5\' 5"  (1.651 m)   Wt 235 lb (106.6 kg)   SpO2 97%   BMI 39.11 kg/m   Assessment and Plan: 1. Chest pain, unspecified type New onset.  Duration about 10 days.  Continuous.  Patient has a history of evaluation for stable angina.  Is followed by Dr. Nehemiah Massed cardiology.  Will do EKG. - EKG 12-Lead - DG Chest 2 View; Future - DG Ribs Unilateral Right; Future - traMADol (ULTRAM) 50 MG tablet; Take 1 tablet (50 mg total) by mouth every 6 (six) hours as needed for up to 5 days.  Dispense: 20 tablet; Refill: 0 - Ambulatory referral to Cardiology I have reviewed EKG which shows nonspecific ST wave changes noted.  Otherwise normal rate, rhythm, intervals, no ischemic changes noted.. Comparison to previous  EKG dated 9/19 ST-T wave changes noted as well with no ischemic changes. 2. Chest wall tenderness Patient has an ecchymosis/bruise of the right pectoral anterior chest wall.  Will get rib detail of the fourth fifth and sixth area.  Is encouraged to use Tylenol and/or NSAID along with tramadol 50 mg every 6 hours as needed severe pain - DG Chest 2 View; Future - DG Ribs Unilateral Right; Future - traMADol (ULTRAM) 50 MG tablet; Take 1 tablet (50 mg total) by mouth every 6 (six) hours as needed for up to 5 days.  Dispense: 20 tablet; Refill: 0  3. Type 2 diabetes mellitus without complication, without long-term current use of insulin ([place]) Relatively controlled.  Chronic.  Continue current regimen noted as a risk factor  4. Dyslipidemia Continue statin noted as a risk factor  5. History of angina Evaluated in September 2019 by Dr. Jose Persia with Persantine exercise treadmill was noted to be unremarkable however needs follow-up - Ambulatory referral to Cardiology  6. Essential hypertension Noted for risk factor for cardiology cardiac disease continue medications.

## 2018-08-15 DIAGNOSIS — I119 Hypertensive heart disease without heart failure: Secondary | ICD-10-CM | POA: Insufficient documentation

## 2018-11-16 ENCOUNTER — Other Ambulatory Visit: Payer: Self-pay | Admitting: Family Medicine

## 2018-11-16 DIAGNOSIS — K219 Gastro-esophageal reflux disease without esophagitis: Secondary | ICD-10-CM

## 2018-12-01 LAB — MICROALBUMIN, URINE: Microalb, Ur: 118.1

## 2018-12-01 LAB — HEMOGLOBIN A1C: Hemoglobin A1C: 8.3

## 2018-12-07 ENCOUNTER — Ambulatory Visit: Payer: Medicare Other | Admitting: Family Medicine

## 2018-12-07 ENCOUNTER — Other Ambulatory Visit: Payer: Self-pay

## 2018-12-07 ENCOUNTER — Encounter: Payer: Self-pay | Admitting: Family Medicine

## 2018-12-07 VITALS — BP 126/82 | HR 73 | Ht 65.0 in | Wt 226.0 lb

## 2018-12-07 DIAGNOSIS — Z23 Encounter for immunization: Secondary | ICD-10-CM

## 2018-12-07 DIAGNOSIS — G4733 Obstructive sleep apnea (adult) (pediatric): Secondary | ICD-10-CM | POA: Diagnosis not present

## 2018-12-07 DIAGNOSIS — E119 Type 2 diabetes mellitus without complications: Secondary | ICD-10-CM

## 2018-12-07 NOTE — Progress Notes (Signed)
Date:  12/07/2018   Name:  Tracey Russo   DOB:  January 23, 1951   MRN:  [id number]   Chief Complaint: Sleep Apnea (face to face for CPAP machine and supplies. Needs a new machine and supplies. pressure setting is unknown per pt. She is benefitting from using the machine and gets approx. 8 hrs of sleep per night.  ) and influenza vacc need  Patient is a 68 year old female who presents for a face 2 face exam. The patient reports the following problems: need for cpap machine. Health maintenance has been reviewed up to date.   Review of Systems  Constitutional: Negative.  Negative for chills, fatigue, fever and unexpected weight change.  HENT: Negative for congestion, ear discharge, ear pain, rhinorrhea, sinus pressure, sneezing and sore throat.   Eyes: Negative for photophobia, pain, discharge, redness and itching.  Respiratory: Negative for cough, shortness of breath, wheezing and stridor.   Gastrointestinal: Negative for abdominal pain, blood in stool, constipation, diarrhea, nausea and vomiting.  Endocrine: Negative for cold intolerance, heat intolerance, polydipsia, polyphagia and polyuria.  Genitourinary: Negative for dysuria, flank pain, frequency, hematuria, menstrual problem, pelvic pain, urgency, vaginal bleeding and vaginal discharge.  Musculoskeletal: Negative for arthralgias, back pain and myalgias.  Skin: Negative for rash.  Allergic/Immunologic: Negative for environmental allergies and food allergies.  Neurological: Negative for dizziness, weakness, light-headedness, numbness and headaches.  Hematological: Negative for adenopathy. Does not bruise/bleed easily.  Psychiatric/Behavioral: Negative for dysphoric mood. The patient is not nervous/anxious.     Patient Active Problem List   Diagnosis Date Noted  . LVH (left ventricular hypertrophy) due to hypertensive disease, without heart failure 08/15/2018  . Stable angina pectoris ([place]) 12/13/2017  . Recurrent major depressive  disorder ([place]) 10/17/2017  . Mixed hyperlipidemia 12/22/2015  . Acute seasonal allergic rhinitis 12/22/2015  . Acute maxillary sinusitis 12/22/2015  . Incomplete bladder emptying 02/05/2014  . Dyslipidemia 08/09/2013  . Gastroesophageal reflux disease 08/09/2013  . Essential hypertension 08/09/2013  . Diabetes mellitus, type 2 ([place]) 08/09/2013  . Acquired cyst of kidney 09/11/2012  . 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 Urge incontinence 09/11/2012  . Frank hematuria 05/08/2012  . Mixed incontinence 05/08/2012  . Renal colic 0000000    Allergies  Allergen Reactions  . Ace Inhibitors Cough  . Corticosteroids     Other reaction(s): OTHER  . Nsaids     Other reaction(s): ANAPHYLAXIS    Past Surgical History:  Procedure Laterality Date  . CERVICAL FUSION  12/07/2000  . COLONOSCOPY  2006   Dr [name]  . CYSTOSTOMY  09/1984  . SHOULDER SURGERY Right 11/2006   tumor removed  . SUBDURAL HEMATOMA EVACUATION VIA CRANIOTOMY  04/16/2007  . TONSILECTOMY, ADENOIDECTOMY, BILATERAL MYRINGOTOMY AND TUBES  03/2001  . VAGINAL HYSTERECTOMY  02/23/2000    Social History   Tobacco Use  . Smoking status: Former Research scientist (life sciences)  . Smokeless tobacco: Never Used  Substance Use Topics  . Alcohol use: Yes    Alcohol/week: 0.0 standard drinks  . Drug use: No     Medication list has been reviewed and updated.  Current Meds  Medication Sig  . amLODipine (NORVASC) 10 MG tablet TAKE 1 TABLET BY MOUTH ONCE DAILY  . aspirin 81 MG tablet Take 81 mg by mouth daily.  . cetirizine (ZYRTEC) 10 MG tablet Take 10 mg by mouth daily.  . citalopram (CELEXA) 40 MG tablet Take 40 mg by mouth daily. Dr [name]  . clopidogrel (PLAVIX) 75 MG tablet  TAKE 1 TABLET BY MOUTH ONCE DAILY  . empagliflozin (JARDIANCE) 25 MG TABS tablet Take 25 mg by mouth daily. Endo  . esomeprazole (NEXIUM) 40 MG capsule Take 1 capsule by mouth once daily  . gabapentin (NEURONTIN) 300 MG capsule Take 1 capsule by mouth every  6 (six) hours as needed. Dr Eddie Dibbles  . HUMALOG 100 UNIT/ML injection Insulin pump- humalog  . hydrochlorothiazide (MICROZIDE) 12.5 MG capsule TAKE 1 CAPSULE BY MOUTH ONCE DAILY  . Liraglutide (VICTOZA) 18 MG/3ML SOPN Inject 1.8 mg into the skin daily. Dr Eddie Dibbles  . losartan (COZAAR) 25 MG tablet Take 1 tablet by mouth daily.  . metFORMIN (GLUCOPHAGE) 1000 MG tablet Take 1 tablet by mouth 2 (two) times daily. Dr Eddie Dibbles  . metoprolol succinate (TOPROL-XL) 50 MG 24 hr tablet TAKE 1 TABLET BY MOUTH ONCE DAILY WITH OR IMMEDIATELY FOLLOWING A  MEALS  . mupirocin cream (BACTROBAN) 2 % Apply 1 application topically 2 (two) times daily.  Marland Kitchen nystatin (MYCOSTATIN) 100000 UNIT/ML suspension Take 5 mLs (500,000 Units total) by mouth 4 (four) times daily.  . rosuvastatin (CRESTOR) 20 MG tablet TAKE 1 TABLET BY MOUTH ONCE DAILY    PHQ 2/9 Scores 04/24/2018 06/02/2017 05/05/2015 12/16/2014  PHQ - 2 Score 1 0 0 0  PHQ- 9 Score 2 4 - -    BP Readings from Last 3 Encounters:  12/07/18 126/82  08/02/18 (!) 144/100  04/24/18 120/80    Physical Exam Vitals signs and nursing note reviewed.  Constitutional:      General: She is not in acute distress.    Appearance: She is obese. She is not diaphoretic.  HENT:     Head: Normocephalic and atraumatic.     Right Ear: Tympanic membrane, ear canal and external ear normal.     Left Ear: Tympanic membrane, ear canal and external ear normal.     Nose: Nose normal.  Eyes:     General:        Right eye: No discharge.        Left eye: No discharge.     Conjunctiva/sclera: Conjunctivae normal.     Pupils: Pupils are equal, round, and reactive to light.  Neck:     Musculoskeletal: Normal range of motion and neck supple.     Thyroid: No thyromegaly.     Vascular: No JVD.  Cardiovascular:     Rate and Rhythm: Normal rate and regular rhythm.     Pulses:          Dorsalis pedis pulses are 1+ on the right side and 1+ on the left side.       Posterior tibial pulses are 1+ on  the right side and 1+ on the left side.     Heart sounds: Normal heart sounds. No murmur. No friction rub. No gallop.   Pulmonary:     Effort: Pulmonary effort is normal.     Breath sounds: Normal breath sounds.  Abdominal:     General: Bowel sounds are normal.     Palpations: Abdomen is soft. There is no mass.     Tenderness: There is no abdominal tenderness. There is no guarding.  Musculoskeletal: Normal range of motion.  Feet:     Right foot:     Protective Sensation: 10 sites tested. 10 sites sensed.     Skin integrity: Dry skin present. No ulcer, skin breakdown or erythema.     Toenail Condition: Right toenails are normal.     Left foot:  Protective Sensation: 10 sites tested. 10 sites sensed.     Skin integrity: Dry skin present. No ulcer, skin breakdown or erythema.     Toenail Condition: Left toenails are normal.  Lymphadenopathy:     Cervical: No cervical adenopathy.  Skin:    General: Skin is warm and dry.  Neurological:     Mental Status: She is alert.     Deep Tendon Reflexes: Reflexes are normal and symmetric.     Wt Readings from Last 3 Encounters:  12/07/18 226 lb (102.5 kg)  08/02/18 235 lb (106.6 kg)  04/24/18 234 lb (106.1 kg)    BP 126/82   Pulse 73   Ht 5\' 5"  (1.651 m)   Wt 226 lb (102.5 kg)   SpO2 96%   BMI 37.61 kg/m   Assessment and Plan:  1. Obstructive sleep apnea Patient presents for face-to-face exam for sleep apnea evaluation so patient can get an update and resupply of her CPAP supplies.  Patient has been using CPAP for over 8 years with excellent results.  When she does not have her CPAP she has increased somnolence.  Patient's Epworth scale is 13.  2. Influenza vaccine needed Discussed and administered  3. Need for 23-polyvalent pneumococcal polysaccharide vaccine Discussed and administered  4. Type 2 diabetes mellitus without complication, without long-term current use of insulin ([place]) Patient is followed by Dr. Rayburn Go for  her diabetes.  I went on ahead and did her foot exam which is noted in the exam area above

## 2018-12-18 ENCOUNTER — Telehealth: Payer: Self-pay

## 2018-12-18 NOTE — Telephone Encounter (Signed)
[facility] to verify if they received the Rx for CPAP and supplies- they did, spoke to [place]

## 2019-02-26 ENCOUNTER — Other Ambulatory Visit: Payer: Self-pay | Admitting: Family Medicine

## 2019-02-26 DIAGNOSIS — K219 Gastro-esophageal reflux disease without esophagitis: Secondary | ICD-10-CM

## 2019-03-23 DIAGNOSIS — G4733 Obstructive sleep apnea (adult) (pediatric): Secondary | ICD-10-CM | POA: Diagnosis not present

## 2019-03-27 DIAGNOSIS — I10 Essential (primary) hypertension: Secondary | ICD-10-CM | POA: Diagnosis not present

## 2019-03-27 DIAGNOSIS — E669 Obesity, unspecified: Secondary | ICD-10-CM | POA: Diagnosis not present

## 2019-03-27 DIAGNOSIS — E1129 Type 2 diabetes mellitus with other diabetic kidney complication: Secondary | ICD-10-CM | POA: Diagnosis not present

## 2019-03-27 DIAGNOSIS — E559 Vitamin D deficiency, unspecified: Secondary | ICD-10-CM | POA: Diagnosis not present

## 2019-03-27 DIAGNOSIS — Z794 Long term (current) use of insulin: Secondary | ICD-10-CM | POA: Diagnosis not present

## 2019-03-27 DIAGNOSIS — E785 Hyperlipidemia, unspecified: Secondary | ICD-10-CM | POA: Diagnosis not present

## 2019-03-27 DIAGNOSIS — E1165 Type 2 diabetes mellitus with hyperglycemia: Secondary | ICD-10-CM | POA: Diagnosis not present

## 2019-03-27 DIAGNOSIS — E1159 Type 2 diabetes mellitus with other circulatory complications: Secondary | ICD-10-CM | POA: Diagnosis not present

## 2019-03-27 DIAGNOSIS — R809 Proteinuria, unspecified: Secondary | ICD-10-CM | POA: Diagnosis not present

## 2019-03-27 LAB — HEMOGLOBIN A1C: Hemoglobin A1C: 9.9

## 2019-04-23 DIAGNOSIS — G4733 Obstructive sleep apnea (adult) (pediatric): Secondary | ICD-10-CM | POA: Diagnosis not present

## 2019-05-07 ENCOUNTER — Ambulatory Visit: Payer: Medicare PPO | Attending: Internal Medicine

## 2019-05-07 DIAGNOSIS — Z23 Encounter for immunization: Secondary | ICD-10-CM | POA: Insufficient documentation

## 2019-05-07 NOTE — Progress Notes (Signed)
   Covid-19 Vaccination [facility]  Name:  Tracey Russo    MRN: [id number] DOB: 04/12/1950  05/07/2019  Tracey Russo was observed post Covid-19 immunization for 15 minutes without incidence. She was provided with Vaccine Information Sheet and instruction to access the V-Safe system.   Tracey Russo was instructed to call 911 with any severe reactions post vaccine: Marland Kitchen Difficulty breathing  . Swelling of your face and throat  . A fast heartbeat  . A bad rash all over your body  . Dizziness and weakness    Immunizations Administered    Name Date Dose VIS Date Route   Pfizer COVID-19 Vaccine 05/07/2019  8:22 AM 0.3 mL 03/02/2019 Intramuscular   Manufacturer: [place]   Lot: [id number]   [place]: [id number]

## 2019-05-21 DIAGNOSIS — G4733 Obstructive sleep apnea (adult) (pediatric): Secondary | ICD-10-CM | POA: Diagnosis not present

## 2019-05-23 ENCOUNTER — Other Ambulatory Visit: Payer: Self-pay

## 2019-05-23 ENCOUNTER — Ambulatory Visit (INDEPENDENT_AMBULATORY_CARE_PROVIDER_SITE_OTHER): Payer: Medicare PPO

## 2019-05-23 VITALS — BP 114/72 | HR 80 | Resp 16 | Ht 65.0 in | Wt 221.8 lb

## 2019-05-23 DIAGNOSIS — Z0000 Encounter for general adult medical examination without abnormal findings: Secondary | ICD-10-CM

## 2019-05-23 NOTE — Progress Notes (Signed)
Subjective:   Tracey Russo is a 69 y.o. female who presents for an Initial Medicare Annual Wellness Visit.  Review of Systems      Cardiac Risk Factors include: advanced age (>23men, >20 women);diabetes mellitus;dyslipidemia;hypertension;obesity (BMI >30kg/m2)     Objective:    Today's Vitals   05/23/19 1543  BP: 114/72  Pulse: 80  Resp: 16  SpO2: 97%  Weight: 221 lb 12.8 oz (100.6 kg)  Height: 5\' 5"  (1.651 m)   Body mass index is 36.91 kg/m.  Advanced Directives 10/17/2017 10/03/2017  Does Patient Have a Medical Advance Directive? No No  Would patient like information on creating a medical advance directive? No - Patient declined No - Patient declined    Current Medications (verified) Outpatient Encounter Medications as of 05/23/2019  Medication Sig  . amLODipine (NORVASC) 10 MG tablet TAKE 1 TABLET BY MOUTH ONCE DAILY  . aspirin 81 MG tablet Take 81 mg by mouth daily.  . citalopram (CELEXA) 40 MG tablet Take 40 mg by mouth daily. Dr [name]  . clopidogrel (PLAVIX) 75 MG tablet TAKE 1 TABLET BY MOUTH ONCE DAILY  . Cyanocobalamin 1000 MCG TBCR Take 1 tablet by mouth daily.  . empagliflozin (JARDIANCE) 25 MG TABS tablet Take 25 mg by mouth daily. Endo  . EQ ALLERGY RELIEF, CETIRIZINE, 10 MG tablet Take 1 tablet by mouth once daily  . esomeprazole (NEXIUM) 40 MG capsule Take 1 capsule by mouth once daily  . gabapentin (NEURONTIN) 300 MG capsule Take 1 capsule by mouth every 6 (six) hours as needed. Dr Eddie Dibbles  . glucose blood (ACCU-CHEK AVIVA PLUS) test strip USE ONE STRIP TO CHECK GLUCOSE THREE TIMES DAILY AS  DIRECTED  . hydrochlorothiazide (MICROZIDE) 12.5 MG capsule TAKE 1 CAPSULE BY MOUTH ONCE DAILY  . insulin aspart (NOVOLOG) 100 UNIT/ML injection USE UP TO 60 UNITS PER DAY IN VGO PUMP AS DIRECTED  . isosorbide mononitrate (IMDUR) 30 MG 24 hr tablet Take 1 tablet by mouth daily.  . Liraglutide (VICTOZA) 18 MG/3ML SOPN Inject 1.8 mg into the skin daily. Dr Eddie Dibbles  .  metFORMIN (GLUCOPHAGE) 1000 MG tablet Take 1 tablet by mouth 2 (two) times daily. Dr Eddie Dibbles  . metoprolol succinate (TOPROL-XL) 50 MG 24 hr tablet TAKE 1 TABLET BY MOUTH ONCE DAILY WITH OR IMMEDIATELY FOLLOWING A  MEALS  . rosuvastatin (CRESTOR) 20 MG tablet TAKE 1 TABLET BY MOUTH ONCE DAILY  . losartan (COZAAR) 25 MG tablet Take 1 tablet by mouth daily.  . [DISCONTINUED] HUMALOG 100 UNIT/ML injection Insulin pump- humalog  . [DISCONTINUED] mupirocin cream (BACTROBAN) 2 % Apply 1 application topically 2 (two) times daily.  . [DISCONTINUED] nystatin (MYCOSTATIN) 100000 UNIT/ML suspension Take 5 mLs (500,000 Units total) by mouth 4 (four) times daily.   No facility-administered encounter medications on file as of 05/23/2019.    Allergies (verified) Ace inhibitors, Corticosteroids, and Nsaids   History: Past Medical History:  Diagnosis Date  . Allergy   . Depression   . Diabetes mellitus without complication ([place])   . GERD (gastroesophageal reflux disease)   . Hypertension   . Neuropathy    Past Surgical History:  Procedure Laterality Date  . CERVICAL FUSION  12/07/2000  . COLONOSCOPY  2006   Dr [name]  . CYSTOSTOMY  09/1984  . SHOULDER SURGERY Right 11/2006   tumor removed  . SUBDURAL HEMATOMA EVACUATION VIA CRANIOTOMY  04/16/2007  . TONSILECTOMY, ADENOIDECTOMY, BILATERAL MYRINGOTOMY AND TUBES  03/2001  . VAGINAL HYSTERECTOMY  02/23/2000  Family History  Problem Relation Age of Onset  . Leukemia Paternal Grandfather    Social History   Socioeconomic History  . Marital status: Married    Spouse name: Not on file  . Number of children: Not on file  . Years of education: Not on file  . Highest education level: Not on file  Occupational History    Comment: retired  Tobacco Use  . Smoking status: Former Research scientist (life sciences)  . Smokeless tobacco: Never Used  Substance and Sexual Activity  . Alcohol use: Not Currently    Alcohol/week: 0.0 standard drinks  . Drug use: No  . Sexual  activity: Yes  Other Topics Concern  . Not on file  Social History Narrative  . Not on file   Social Determinants of Health   Financial Resource Strain: Low Risk   . Difficulty of Paying Living Expenses: Not hard at all  Food Insecurity: No Food Insecurity  . Worried About Charity fundraiser in the Last Year: Never true  . Ran Out of Food in the Last Year: Never true  Transportation Needs: No Transportation Needs  . Lack of Transportation (Medical): No  . Lack of Transportation (Non-Medical): No  Physical Activity: Inactive  . Days of Exercise per Week: 0 days  . Minutes of Exercise per Session: 0 min  Stress: No Stress Concern Present  . Feeling of Stress : Not at all  Social Connections: Unknown  . Frequency of Communication with Friends and Family: Patient refused  . Frequency of Social Gatherings with Friends and Family: Patient refused  . Attends Religious Services: Patient refused  . Active Member of Clubs or Organizations: Patient refused  . Attends Archivist Meetings: Patient refused  . Marital Status: Married    Tobacco Counseling Counseling given: Not Answered   Clinical Intake:  Pre-visit preparation completed: Yes  Pain : No/denies pain     BMI - recorded: 36.91 Nutritional Status: BMI > 30  Obese Nutritional Risks: None Diabetes: Yes CBG done?: No Did pt. bring in CBG monitor from home?: No   Nutrition Risk Assessment:  Has the patient had any N/V/D within the last 2 months?  No  Does the patient have any non-healing wounds?  No  Has the patient had any unintentional weight loss or weight gain?  No   Diabetes:  Is the patient diabetic?  Yes  If diabetic, was a CBG obtained today?  No  Did the patient bring in their glucometer from home?  No  How often do you monitor your CBG's? daily.   Financial Strains and Diabetes Management:  Are you having any financial strains with the device, your supplies or your medication? No .  Does  the patient want to be seen by Chronic Care Management for management of their diabetes?  No  Would the patient like to be referred to a Nutritionist or for Diabetic Management?  No   Diabetic Exams:  Diabetic Eye Exam:  Overdue for diabetic eye exam. Pt has been advised about the importance in completing this exam. Appt scheduled for April.   Diabetic Foot Exam: Completed 12/07/18.   How often do you need to have someone help you when you read instructions, pamphlets, or other written materials from your doctor or pharmacy?: 1 - Never  Interpreter Needed?: No  Information entered by :: Clemetine Marker LPN   Activities of Daily Living In your present state of health, do you have any difficulty performing the following activities: 05/23/2019  Hearing? N  Comment declines hearing aids  Vision? Y  Difficulty concentrating or making decisions? N  Walking or climbing stairs? N  Dressing or bathing? N  Doing errands, shopping? N  Preparing Food and eating ? N  Using the Toilet? N  In the past six months, have you accidently leaked urine? Y  Comment wears pads for protection  Do you have problems with loss of bowel control? N  Managing your Medications? N  Managing your Finances? N  Housekeeping or managing your Housekeeping? N  Some recent data might be hidden     Immunizations and Health Maintenance Immunization History  Administered Date(s) Administered  . Fluad Quad(high Dose 65+) 12/07/2018  . Influenza, High Dose Seasonal PF 12/06/2017  . Influenza,inj,Quad PF,6+ Mos 05/09/2015, 12/22/2015, 06/02/2017  . PFIZER SARS-COV-2 Vaccination 05/07/2019  . Pneumococcal Conjugate-13 06/02/2017  . Pneumococcal Polysaccharide-23 12/07/2018  . Tdap 07/03/2013   Health Maintenance Due  Topic Date Due  . Hepatitis C Screening  1951-02-03  . COLONOSCOPY  07/10/1998  . MAMMOGRAM  01/02/2010  . DEXA SCAN  07/09/2013  . OPHTHALMOLOGY EXAM  12/21/2015  . URINE MICROALBUMIN  02/09/2019     Patient Care Team: [name], MD as PCP - General (Family Medicine)  Indicate any recent Medical Services you may have received from other than [facility] providers in the past year (date may be approximate).     Assessment:   This is a routine wellness examination for [place].  Hearing/Vision screen  Hearing Screening   125Hz  250Hz  500Hz  1000Hz  2000Hz  3000Hz  4000Hz  6000Hz  8000Hz   Right ear:           Left ear:           Comments: Pt denies hearing difficulty  Vision Screening Comments: Pt scheduled [facility] vision screening in April 2021 [facility]  Dietary issues and exercise activities discussed: Current Exercise Habits: The patient does not participate in regular exercise at present, Exercise limited by: orthopedic condition(s)  Goals    . Increase physical activity     Pt would like to increase physical activity to at least 3 days per week      Depression Screen PHQ 2/9 Scores 05/23/2019 04/24/2018 06/02/2017 05/05/2015 12/16/2014  PHQ - 2 Score 0 1 0 0 0  PHQ- 9 Score - 2 4 - -    Fall Risk Fall Risk  05/23/2019 06/02/2017 05/09/2015 05/05/2015 12/16/2014  Falls in the past year? 1 No Yes No No  Number falls in past yr: 1 - 1 - -  Injury with Fall? 0 - - - -  Risk for fall due to : Impaired mobility;History of fall(s);Impaired balance/gait;Other (Comment) - - - -  Risk for fall due to: Comment neuropathy - - - -   FALL RISK PREVENTION PERTAINING TO THE HOME:  Any stairs in or around the home? Yes  If so, do they handrails? Yes   Home free of loose throw rugs in walkways, pet beds, electrical cords, etc? Yes  Adequate lighting in your home to reduce risk of falls? Yes   ASSISTIVE DEVICES UTILIZED TO PREVENT FALLS:  Life alert? No  Use of a cane, walker or w/c? Yes  Grab bars in the bathroom? Yes  Shower chair or bench in shower? Yes  Elevated toilet seat or a handicapped toilet? Yes   DME ORDERS:  DME order needed?  No   TIMED UP AND GO:  Was the test  performed? Yes .  Length  of time to ambulate 10 feet: 6 sec.   GAIT:  Appearance of gait: Gait stead-fast and without the use of an assistive device.   Education: Fall risk prevention has been discussed.  Intervention(s) required? No    Cognitive Function:     6CIT Screen 05/23/2019  What Year? 0 points  What month? 0 points  What time? 0 points  Count back from 20 0 points  Months in reverse 0 points  Repeat phrase 0 points  Total Score 0    Screening Tests Health Maintenance  Topic Date Due  . Hepatitis C Screening  [birth year]  . COLONOSCOPY  07/10/1998  . MAMMOGRAM  01/02/2010  . DEXA SCAN  07/09/2013  . OPHTHALMOLOGY EXAM  12/21/2015  . URINE MICROALBUMIN  02/09/2019  . HEMOGLOBIN A1C  09/24/2019  . FOOT EXAM  12/07/2019  . TETANUS/TDAP  07/04/2023  . INFLUENZA VACCINE  Completed  . PNA vac Low Risk Adult  Completed    Qualifies for Shingles Vaccine? Yes . Due for Shingrix. Education has been provided regarding the importance of this vaccine. Pt has been advised to call insurance company to determine out of pocket expense. Advised may also receive vaccine at local pharmacy or Health Dept. Verbalized acceptance and understanding.  Tdap: Up to date  Flu Vaccine: Up to date  Pneumococcal Vaccine: Up to date   Cancer Screenings:  Colorectal Screening: pt declines this screening.   Mammogram: due; pt requests to postpone this screening.   Bone Density: due - pt requests to postpone this screening.   Lung Cancer Screening: (Low Dose CT Chest recommended if Age 13-80 years, 30 pack-year currently smoking OR have quit w/in 15years.) does not qualify.    Additional Screening:  Hepatitis C Screening: does qualify; postponed  Vision Screening: Recommended annual ophthalmology exams for early detection of glaucoma and other disorders of the eye. Is the patient up to date with their annual eye exam?  No  - appt scheduled for April Who is the provider or what is  the name of the office in which the pt attends annual eye exams? [place] Screening: Recommended annual dental exams for proper oral hygiene  Community Resource Referral:  CRR required this visit?  No        Plan:    I have personally reviewed and addressed the Medicare Annual Wellness questionnaire and have noted the following in the patient's chart:  A. Medical and social history B. Use of alcohol, tobacco or illicit drugs  C. Current medications and supplements D. Functional ability and status E.  Nutritional status F.  Physical activity G. Advance directives H. List of other physicians I.  Hospitalizations, surgeries, and ER visits in previous 12 months J.  [place] such as hearing and vision if needed, cognitive and depression L. Referrals and appointments   In addition, I have reviewed and discussed with patient certain preventive protocols, quality metrics, and best practice recommendations. A written personalized care plan for preventive services as well as general preventive health recommendations were provided to patient.   Signed,   Clemetine Marker, LPN Nurse Health Advisor   Nurse Notes: none

## 2019-05-23 NOTE — Patient Instructions (Signed)
[name] , Thank you for taking time to come for your Medicare Wellness Visit. I appreciate your ongoing commitment to your health goals. Please review the following plan we discussed and let me know if I can assist you in the future.   Screening recommendations/referrals: Colonoscopy: postponed Mammogram: due Bone Density: due Recommended yearly ophthalmology/optometry visit for glaucoma screening and checkup Recommended yearly dental visit for hygiene and checkup  Vaccinations: Influenza vaccine: done 12/07/18 Pneumococcal vaccine: done 12/07/18 Tdap vaccine: done 07/03/13 Shingles vaccine: Shingrix discussed. Please contact your pharmacy for coverage information.  Covid-19: 1st dose 05/07/19  Advanced directives: Advance directive discussed with you today. I have provided a copy for you to complete at home and have notarized. Once this is complete please bring a copy in to our office so we can scan it into your chart.  Conditions/risks identified: Recommend increasing physical activity   Next appointment: Please follow up in one year for your Medicare Annual Wellness visit.     Preventive Care 71 Years and Older, Female Preventive care refers to lifestyle choices and visits with your health care provider that can promote health and wellness. What does preventive care include?  A yearly physical exam. This is also called an annual well check.  Dental exams once or twice a year.  Routine eye exams. Ask your health care provider how often you should have your eyes checked.  Personal lifestyle choices, including:  Daily care of your teeth and gums.  Regular physical activity.  Eating a healthy diet.  Avoiding tobacco and drug use.  Limiting alcohol use.  Practicing safe sex.  Taking low-dose aspirin every day.  Taking vitamin and mineral supplements as recommended by your health care provider. What happens during an annual well check? The services and screenings  done by your health care provider during your annual well check will depend on your age, overall health, lifestyle risk factors, and family history of disease. Counseling  Your health care provider may ask you questions about your:  Alcohol use.  Tobacco use.  Drug use.  Emotional well-being.  Home and relationship well-being.  Sexual activity.  Eating habits.  History of falls.  Memory and ability to understand (cognition).  Work and work Statistician.  Reproductive health. Screening  You may have the following tests or measurements:  Height, weight, and BMI.  Blood pressure.  Lipid and cholesterol levels. These may be checked every 5 years, or more frequently if you are over 21 years old.  Skin check.  Lung cancer screening. You may have this screening every year starting at age 71 if you have a 30-pack-year history of smoking and currently smoke or have quit within the past 15 years.  Fecal occult blood test (FOBT) of the stool. You may have this test every year starting at age 71.  Flexible sigmoidoscopy or colonoscopy. You may have a sigmoidoscopy every 5 years or a colonoscopy every 10 years starting at age 71.  Hepatitis C blood test.  Hepatitis B blood test.  Sexually transmitted disease (STD) testing.  Diabetes screening. This is done by checking your blood sugar (glucose) after you have not eaten for a while (fasting). You may have this done every 1-3 years.  Bone density scan. This is done to screen for osteoporosis. You may have this done starting at age 26.  Mammogram. This may be done every 1-2 years. Talk to your health care provider about how often you should have regular mammograms. Talk with your health care  provider about your test results, treatment options, and if necessary, the need for more tests. Vaccines  Your health care provider may recommend certain vaccines, such as:  Influenza vaccine. This is recommended every year.  Tetanus,  diphtheria, and acellular pertussis (Tdap, Td) vaccine. You may need a Td booster every 10 years.  Zoster vaccine. You may need this after age 22.  Pneumococcal 13-valent conjugate (PCV13) vaccine. One dose is recommended after age 71.  Pneumococcal polysaccharide (PPSV23) vaccine. One dose is recommended after age 72. Talk to your health care provider about which screenings and vaccines you need and how often you need them. This information is not intended to replace advice given to you by your health care provider. Make sure you discuss any questions you have with your health care provider. Document Released: 04/04/2015 Document Revised: 11/26/2015 Document Reviewed: 01/07/2015 Elsevier Interactive Patient Education  2017 [place] Prevention in the Home Falls can cause injuries. They can happen to people of all ages. There are many things you can do to make your home safe and to help prevent falls. What can I do on the outside of my home?  Regularly fix the edges of walkways and driveways and fix any cracks.  Remove anything that might make you trip as you walk through a door, such as a raised step or threshold.  Trim any bushes or trees on the path to your home.  Use bright outdoor lighting.  Clear any walking paths of anything that might make someone trip, such as rocks or tools.  Regularly check to see if handrails are loose or broken. Make sure that both sides of any steps have handrails.  Any raised decks and porches should have guardrails on the edges.  Have any leaves, snow, or ice cleared regularly.  Use sand or salt on walking paths during winter.  Clean up any spills in your garage right away. This includes oil or grease spills. What can I do in the bathroom?  Use night lights.  Install grab bars by the toilet and in the tub and shower. Do not use towel bars as grab bars.  Use non-skid mats or decals in the tub or shower.  If you need to sit down in  the shower, use a plastic, non-slip stool.  Keep the floor dry. Clean up any water that spills on the floor as soon as it happens.  Remove soap buildup in the tub or shower regularly.  Attach bath mats securely with double-sided non-slip rug tape.  Do not have throw rugs and other things on the floor that can make you trip. What can I do in the bedroom?  Use night lights.  Make sure that you have a light by your bed that is easy to reach.  Do not use any sheets or blankets that are too big for your bed. They should not hang down onto the floor.  Have a firm chair that has side arms. You can use this for support while you get dressed.  Do not have throw rugs and other things on the floor that can make you trip. What can I do in the kitchen?  Clean up any spills right away.  Avoid walking on wet floors.  Keep items that you use a lot in easy-to-reach places.  If you need to reach something above you, use a strong step stool that has a grab bar.  Keep electrical cords out of the way.  Do not use floor polish  or wax that makes floors slippery. If you must use wax, use non-skid floor wax.  Do not have throw rugs and other things on the floor that can make you trip. What can I do with my stairs?  Do not leave any items on the stairs.  Make sure that there are handrails on both sides of the stairs and use them. Fix handrails that are broken or loose. Make sure that handrails are as long as the stairways.  Check any carpeting to make sure that it is firmly attached to the stairs. Fix any carpet that is loose or worn.  Avoid having throw rugs at the top or bottom of the stairs. If you do have throw rugs, attach them to the floor with carpet tape.  Make sure that you have a light switch at the top of the stairs and the bottom of the stairs. If you do not have them, ask someone to add them for you. What else can I do to help prevent falls?  Wear shoes that:  Do not have high  heels.  Have rubber bottoms.  Are comfortable and fit you well.  Are closed at the toe. Do not wear sandals.  If you use a stepladder:  Make sure that it is fully opened. Do not climb a closed stepladder.  Make sure that both sides of the stepladder are locked into place.  Ask someone to hold it for you, if possible.  Clearly mark and make sure that you can see:  Any grab bars or handrails.  First and last steps.  Where the edge of each step is.  Use tools that help you move around (mobility aids) if they are needed. These include:  Canes.  Walkers.  Scooters.  Crutches.  Turn on the lights when you go into a dark area. Replace any light bulbs as soon as they burn out.  Set up your furniture so you have a clear path. Avoid moving your furniture around.  If any of your floors are uneven, fix them.  If there are any pets around you, be aware of where they are.  Review your medicines with your doctor. Some medicines can make you feel dizzy. This can increase your chance of falling. Ask your doctor what other things that you can do to help prevent falls. This information is not intended to replace advice given to you by your health care provider. Make sure you discuss any questions you have with your health care provider. Document Released: 01/02/2009 Document Revised: 08/14/2015 Document Reviewed: 04/12/2014 Elsevier Interactive Patient Education  2017 [employer].

## 2019-05-29 ENCOUNTER — Ambulatory Visit: Payer: Medicare PPO | Admitting: Family Medicine

## 2019-05-29 ENCOUNTER — Other Ambulatory Visit: Payer: Self-pay

## 2019-05-29 ENCOUNTER — Encounter: Payer: Self-pay | Admitting: Family Medicine

## 2019-05-29 VITALS — BP 120/76 | HR 72 | Ht 65.0 in | Wt 223.0 lb

## 2019-05-29 DIAGNOSIS — K219 Gastro-esophageal reflux disease without esophagitis: Secondary | ICD-10-CM | POA: Diagnosis not present

## 2019-05-29 DIAGNOSIS — E785 Hyperlipidemia, unspecified: Secondary | ICD-10-CM | POA: Diagnosis not present

## 2019-05-29 DIAGNOSIS — I679 Cerebrovascular disease, unspecified: Secondary | ICD-10-CM | POA: Diagnosis not present

## 2019-05-29 DIAGNOSIS — I10 Essential (primary) hypertension: Secondary | ICD-10-CM | POA: Diagnosis not present

## 2019-05-29 MED ORDER — HYDROCHLOROTHIAZIDE 12.5 MG PO CAPS: 12.5000 mg | ORAL_CAPSULE | Freq: Every day | ORAL | 1 refills | Status: DC

## 2019-05-29 MED ORDER — CLOPIDOGREL BISULFATE 75 MG PO TABS: 75.0000 mg | ORAL_TABLET | Freq: Every day | ORAL | 1 refills | Status: DC

## 2019-05-29 MED ORDER — ESOMEPRAZOLE MAGNESIUM 40 MG PO CPDR: 40.0000 mg | DELAYED_RELEASE_CAPSULE | Freq: Every day | ORAL | 1 refills | Status: DC

## 2019-05-29 MED ORDER — AMLODIPINE BESYLATE 10 MG PO TABS: 10.0000 mg | ORAL_TABLET | Freq: Every day | ORAL | 1 refills | Status: DC

## 2019-05-29 MED ORDER — METOPROLOL SUCCINATE ER 50 MG PO TB24: ORAL_TABLET | ORAL | 1 refills | Status: DC

## 2019-05-29 MED ORDER — ROSUVASTATIN CALCIUM 20 MG PO TABS: 20.0000 mg | ORAL_TABLET | Freq: Every day | ORAL | 1 refills | Status: DC

## 2019-05-29 NOTE — Progress Notes (Signed)
Date:  05/29/2019   Name:  Tracey Russo   DOB:  [birth year]   MRN:  [id number]   Chief Complaint: Gastroesophageal Reflux, Hypertension, Allergic Rhinitis , and Cerebrovascular Accident (takes plavix)  Gastroesophageal Reflux She complains of heartburn. She reports no abdominal pain, no belching, no chest pain, no choking, no coughing, no dysphagia, no early satiety, no globus sensation, no hoarse voice, no nausea, no sore throat, no stridor or no wheezing. This is a chronic problem. The current episode started more than 1 year ago. The problem has been gradually improving. The heartburn is of moderate intensity. The symptoms are aggravated by certain foods. Pertinent negatives include no anemia, fatigue, melena, muscle weakness, orthopnea or weight loss. Risk factors include hiatal hernia. She has tried a PPI for the symptoms. The treatment provided moderate relief. Past procedures do not include an abdominal ultrasound, an EGD, esophageal manometry, esophageal pH monitoring, H. pylori antibody titer or a UGI.  Hypertension This is a chronic problem. The current episode started more than 1 year ago. The problem has been gradually improving since onset. The problem is controlled. Pertinent negatives include no anxiety, blurred vision, chest pain, headaches, malaise/fatigue, neck pain, orthopnea, palpitations, peripheral edema, PND, shortness of breath or sweats. There are no known risk factors for coronary artery disease. Past treatments include beta blockers, diuretics, calcium channel blockers and angiotensin blockers. The current treatment provides moderate improvement. There are no compliance problems.  There is no history of angina, kidney disease, CAD/MI, CVA, heart failure, left ventricular hypertrophy, PVD or retinopathy. There is no history of chronic renal disease, a hypertension causing med or renovascular disease.  Cerebrovascular Accident This is a chronic problem. The current episode  started more than 1 year ago. The problem occurs intermittently. The problem has been gradually improving. Pertinent negatives include no abdominal pain, arthralgias, chest pain, chills, congestion, coughing, fatigue, fever, headaches, myalgias, nausea, neck pain, numbness, rash, sore throat, vomiting or weakness.    Lab Results  Component Value Date   CREATININE 0.73 10/03/2017   BUN 11 10/03/2017   NA 137 10/03/2017   K 3.7 10/03/2017   CL 98 10/03/2017   CO2 26 10/03/2017   Lab Results  Component Value Date   CHOL 106 06/02/2017   HDL 43 06/02/2017   LDLCALC 38 06/02/2017   TRIG 123 06/02/2017   CHOLHDL 2.5 06/02/2017   No results found for: TSH Lab Results  Component Value Date   HGBA1C 9.9 03/27/2019     Review of Systems  Constitutional: Negative.  Negative for chills, fatigue, fever, malaise/fatigue, unexpected weight change and weight loss.  HENT: Negative for congestion, ear discharge, ear pain, hoarse voice, rhinorrhea, sinus pressure, sneezing and sore throat.   Eyes: Negative for blurred vision, photophobia, pain, discharge, redness and itching.  Respiratory: Negative for cough, choking, shortness of breath, wheezing and stridor.   Cardiovascular: Negative for chest pain, palpitations, orthopnea and PND.  Gastrointestinal: Positive for heartburn. Negative for abdominal pain, blood in stool, constipation, diarrhea, dysphagia, melena, nausea and vomiting.  Endocrine: Negative for cold intolerance, heat intolerance, polydipsia, polyphagia and polyuria.  Genitourinary: Negative for dysuria, flank pain, frequency, hematuria, menstrual problem, pelvic pain, urgency, vaginal bleeding and vaginal discharge.  Musculoskeletal: Negative for arthralgias, back pain, myalgias, muscle weakness and neck pain.  Skin: Negative for rash.  Allergic/Immunologic: Negative for environmental allergies and food allergies.  Neurological: Negative for dizziness, weakness, light-headedness,  numbness and headaches.  Hematological: Negative for adenopathy. Does not bruise/bleed  easily.  Psychiatric/Behavioral: Negative for dysphoric mood. The patient is not nervous/anxious.     Patient Active Problem List   Diagnosis Date Noted  . LVH (left ventricular hypertrophy) due to hypertensive disease, without heart failure 08/15/2018  . Obesity (BMI 30-39.9) 05/02/2018  . Vitamin D deficiency 02/03/2018  . Stable angina pectoris ([place]) 12/13/2017  . Bilateral carotid artery stenosis 12/13/2017  . Recurrent major depressive disorder ([place]) 10/17/2017  . Mixed hyperlipidemia 12/22/2015  . Acute seasonal allergic rhinitis 12/22/2015  . Acute maxillary sinusitis 12/22/2015  . Incomplete bladder emptying 02/05/2014  . Dyslipidemia 08/09/2013  . Gastroesophageal reflux disease 08/09/2013  . Essential hypertension 08/09/2013  . Diabetes mellitus, type 2 ([place]) 08/09/2013  . Renal cyst, acquired, left 09/11/2012  . 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 Urge incontinence 09/11/2012  . Frank hematuria 05/08/2012  . Mixed incontinence 05/08/2012  . Renal colic 0000000    Allergies  Allergen Reactions  . Ace Inhibitors Cough  . Corticosteroids     Other reaction(s): OTHER  . Nsaids     Other reaction(s): ANAPHYLAXIS    Past Surgical History:  Procedure Laterality Date  . CERVICAL FUSION  12/07/2000  . COLONOSCOPY  2006   Dr [name]  . CYSTOSTOMY  09/1984  . SHOULDER SURGERY Right 11/2006   tumor removed  . SUBDURAL HEMATOMA EVACUATION VIA CRANIOTOMY  04/16/2007  . TONSILECTOMY, ADENOIDECTOMY, BILATERAL MYRINGOTOMY AND TUBES  03/2001  . VAGINAL HYSTERECTOMY  02/23/2000    Social History   Tobacco Use  . Smoking status: Former Research scientist (life sciences)  . Smokeless tobacco: Never Used  Substance Use Topics  . Alcohol use: Not Currently    Alcohol/week: 0.0 standard drinks  . Drug use: No     Medication list has been reviewed and updated.  Current Meds  Medication Sig   . amLODipine (NORVASC) 10 MG tablet TAKE 1 TABLET BY MOUTH ONCE DAILY  . aspirin 81 MG tablet Take 81 mg by mouth daily.  . citalopram (CELEXA) 40 MG tablet Take 40 mg by mouth daily. Dr [name]  . clopidogrel (PLAVIX) 75 MG tablet TAKE 1 TABLET BY MOUTH ONCE DAILY  . Cyanocobalamin 1000 MCG TBCR Take 1 tablet by mouth daily.  . empagliflozin (JARDIANCE) 25 MG TABS tablet Take 25 mg by mouth daily. Endo  . EQ ALLERGY RELIEF, CETIRIZINE, 10 MG tablet Take 1 tablet by mouth once daily  . esomeprazole (NEXIUM) 40 MG capsule Take 1 capsule by mouth once daily  . gabapentin (NEURONTIN) 300 MG capsule Take 1 capsule by mouth every 6 (six) hours as needed. endo  . glucose blood (ACCU-CHEK AVIVA PLUS) test strip USE ONE STRIP TO CHECK GLUCOSE THREE TIMES DAILY AS  DIRECTED  . hydrochlorothiazide (MICROZIDE) 12.5 MG capsule TAKE 1 CAPSULE BY MOUTH ONCE DAILY  . insulin aspart (NOVOLOG) 100 UNIT/ML injection USE UP TO 60 UNITS PER DAY IN VGO PUMP AS DIRECTED  . isosorbide mononitrate (IMDUR) 30 MG 24 hr tablet Take 1 tablet by mouth daily.  . Liraglutide (VICTOZA) 18 MG/3ML SOPN Inject 1.8 mg into the skin daily. Dr Eddie Dibbles  . losartan (COZAAR) 25 MG tablet Take 1 tablet by mouth daily.  . metFORMIN (GLUCOPHAGE) 1000 MG tablet Take 1 tablet by mouth 2 (two) times daily. Dr Eddie Dibbles  . metoprolol succinate (TOPROL-XL) 50 MG 24 hr tablet TAKE 1 TABLET BY MOUTH ONCE DAILY WITH OR IMMEDIATELY FOLLOWING A  MEALS  . rosuvastatin (CRESTOR) 20 MG tablet TAKE 1 TABLET BY MOUTH ONCE DAILY  PHQ 2/9 Scores 05/29/2019 05/23/2019 04/24/2018 06/02/2017  PHQ - 2 Score 0 0 1 0  PHQ- 9 Score 0 - 2 4    BP Readings from Last 3 Encounters:  05/29/19 120/76  05/23/19 114/72  12/07/18 126/82    Physical Exam Vitals and nursing note reviewed.  Constitutional:      Appearance: She is well-developed.  HENT:     Head: Normocephalic.     Right Ear: Tympanic membrane, ear canal and external ear normal.     Left Ear: Tympanic  membrane, ear canal and external ear normal.     Nose: Nose normal.     Mouth/Throat:     Mouth: Mucous membranes are moist.  Eyes:     General: Lids are everted, no foreign bodies appreciated. No scleral icterus.       Left eye: No foreign body or hordeolum.     Conjunctiva/sclera: Conjunctivae normal.     Right eye: Right conjunctiva is not injected.     Left eye: Left conjunctiva is not injected.     Pupils: Pupils are equal, round, and reactive to light.  Neck:     Thyroid: No thyromegaly.     Vascular: No JVD.     Trachea: No tracheal deviation.  Cardiovascular:     Rate and Rhythm: Normal rate and regular rhythm.     Heart sounds: Normal heart sounds. No murmur. No friction rub. No gallop.   Pulmonary:     Effort: Pulmonary effort is normal. No respiratory distress.     Breath sounds: Normal breath sounds. No wheezing, rhonchi or rales.  Abdominal:     General: Bowel sounds are normal.     Palpations: Abdomen is soft. There is no mass.     Tenderness: There is no abdominal tenderness. There is no guarding or rebound.  Musculoskeletal:        General: No tenderness. Normal range of motion.     Cervical back: Normal range of motion and neck supple.  Lymphadenopathy:     Cervical: No cervical adenopathy.  Skin:    General: Skin is warm.     Findings: No rash.  Neurological:     Mental Status: She is alert and oriented to person, place, and time.     Cranial Nerves: No cranial nerve deficit.     Deep Tendon Reflexes: Reflexes normal.  Psychiatric:        Mood and Affect: Mood is not anxious or depressed.     Wt Readings from Last 3 Encounters:  05/29/19 223 lb (101.2 kg)  05/23/19 221 lb 12.8 oz (100.6 kg)  12/07/18 226 lb (102.5 kg)    BP 120/76   Pulse 72   Ht 5\' 5"  (1.651 m)   Wt 223 lb (101.2 kg)   BMI 37.11 kg/m   Assessment and Plan: 1. Essential hypertension .  Controlled.  Stable.  Continue amlodipine 10 mg once a day, hydrochlorothiazide 12.5 mg  once a day and metoprolol XL 50 mg once a day.  And hydrochlorothiazide 12.5 mg once a day.  Blood pressure looks good today at 120/70.  Patient needs CMP. - amLODipine (NORVASC) 10 MG tablet; Take 1 tablet (10 mg total) by mouth daily.  Dispense: 90 tablet; Refill: 1 - hydrochlorothiazide (MICROZIDE) 12.5 MG capsule; Take 1 capsule (12.5 mg total) by mouth daily.  Dispense: 90 capsule; Refill: 1 - metoprolol succinate (TOPROL-XL) 50 MG 24 hr tablet; TAKE 1 TABLET BY MOUTH ONCE DAILY WITH OR IMMEDIATELY  FOLLOWING A  MEALS  Dispense: 90 tablet; Refill: 1 - Comprehensive Metabolic Panel (CMET)  2. Cerebrovascular disease Night.  Stable.  No progression.  Patient is doing well with no symptomatology or residual of the CVA.  We will continue current regimen consisting of low-dose aspirin, blood pressure control, and control of lipid management. - clopidogrel (PLAVIX) 75 MG tablet; Take 1 tablet (75 mg total) by mouth daily.  Dispense: 90 tablet; Refill: 1  3. Gastroesophageal reflux disease Chronic.  Controlled.  Stable.  Continue Nexium 40 mg once a day. - esomeprazole (NEXIUM) 40 MG capsule; Take 1 capsule (40 mg total) by mouth daily.  Dispense: 90 capsule; Refill: 1  4. Dyslipidemia Chronic.  Controlled.  Stable.  Patient is currently taking Crestor 20 mg once a day and will continue to do so.  Will check lipid panel with ratio and adjust accordingly. - rosuvastatin (CRESTOR) 20 MG tablet; Take 1 tablet (20 mg total) by mouth daily.  Dispense: 90 tablet; Refill: 1 - Lipid Panel With LDL/HDL Ratio

## 2019-05-30 LAB — COMPREHENSIVE METABOLIC PANEL
ALT: 16 IU/L (ref 0–32)
AST: 12 IU/L (ref 0–40)
Albumin/Globulin Ratio: 1.8 (ref 1.2–2.2)
Albumin: 4.6 g/dL (ref 3.8–4.8)
Alkaline Phosphatase: 110 IU/L (ref 39–117)
BUN/Creatinine Ratio: 14 (ref 12–28)
BUN: 13 mg/dL (ref 8–27)
Bilirubin Total: 0.4 mg/dL (ref 0.0–1.2)
CO2: 22 mmol/L (ref 20–29)
Calcium: 9.6 mg/dL (ref 8.7–10.3)
Chloride: 99 mmol/L (ref 96–106)
Creatinine, Ser: 0.92 mg/dL (ref 0.57–1.00)
GFR calc Af Amer: 73 mL/min/{1.73_m2} (ref >59)
GFR calc non Af Amer: 63 mL/min/{1.73_m2} (ref >59)
Globulin, Total: 2.5 g/dL (ref 1.5–4.5)
Glucose: 268 mg/dL — ABNORMAL HIGH (ref 65–99)
Potassium: 4.1 mmol/L (ref 3.5–5.2)
Sodium: 137 mmol/L (ref 134–144)
Total Protein: 7.1 g/dL (ref 6.0–8.5)

## 2019-05-30 LAB — LIPID PANEL WITH LDL/HDL RATIO
Cholesterol, Total: 265 mg/dL — ABNORMAL HIGH (ref 100–199)
HDL: 38 mg/dL — ABNORMAL LOW (ref >39)
LDL Chol Calc (NIH): 190 mg/dL — ABNORMAL HIGH (ref 0–99)
LDL/HDL Ratio: 5 ratio — ABNORMAL HIGH (ref 0.0–3.2)
Triglycerides: 192 mg/dL — ABNORMAL HIGH (ref 0–149)
VLDL Cholesterol Cal: 37 mg/dL (ref 5–40)

## 2019-06-05 ENCOUNTER — Ambulatory Visit: Payer: Medicare PPO | Attending: Internal Medicine

## 2019-06-05 DIAGNOSIS — Z23 Encounter for immunization: Secondary | ICD-10-CM

## 2019-06-05 NOTE — Progress Notes (Signed)
   Covid-19 Vaccination Clinic  Name:  Tracey Russo    MRN: [id number] DOB: 11/01/1950  06/05/2019  [name] was observed post Covid-19 immunization for 15 minutes without incident. She was provided with Vaccine Information Sheet and instruction to access the V-Safe system.   [name] was instructed to call 911 with any severe reactions post vaccine: Marland Kitchen Difficulty breathing  . Swelling of face and throat  . A fast heartbeat  . A bad rash all over body  . Dizziness and weakness   Immunizations Administered    Name Date Dose VIS Date Route   Pfizer COVID-19 Vaccine 06/05/2019  9:33 AM 0.3 mL 03/02/2019 Intramuscular   Manufacturer: [place]   Lot: [id number]   [place]: [id number]

## 2019-06-08 ENCOUNTER — Other Ambulatory Visit: Payer: Self-pay | Admitting: Family Medicine

## 2019-06-19 DIAGNOSIS — Z794 Long term (current) use of insulin: Secondary | ICD-10-CM | POA: Diagnosis not present

## 2019-06-19 DIAGNOSIS — E785 Hyperlipidemia, unspecified: Secondary | ICD-10-CM | POA: Diagnosis not present

## 2019-06-19 DIAGNOSIS — E1165 Type 2 diabetes mellitus with hyperglycemia: Secondary | ICD-10-CM | POA: Diagnosis not present

## 2019-06-19 DIAGNOSIS — E559 Vitamin D deficiency, unspecified: Secondary | ICD-10-CM | POA: Diagnosis not present

## 2019-06-21 DIAGNOSIS — G4733 Obstructive sleep apnea (adult) (pediatric): Secondary | ICD-10-CM | POA: Diagnosis not present

## 2019-06-26 DIAGNOSIS — E785 Hyperlipidemia, unspecified: Secondary | ICD-10-CM | POA: Diagnosis not present

## 2019-06-26 DIAGNOSIS — Z794 Long term (current) use of insulin: Secondary | ICD-10-CM | POA: Diagnosis not present

## 2019-06-26 DIAGNOSIS — I10 Essential (primary) hypertension: Secondary | ICD-10-CM | POA: Diagnosis not present

## 2019-06-26 DIAGNOSIS — E669 Obesity, unspecified: Secondary | ICD-10-CM | POA: Diagnosis not present

## 2019-06-26 DIAGNOSIS — E1142 Type 2 diabetes mellitus with diabetic polyneuropathy: Secondary | ICD-10-CM | POA: Diagnosis not present

## 2019-06-26 DIAGNOSIS — E559 Vitamin D deficiency, unspecified: Secondary | ICD-10-CM | POA: Diagnosis not present

## 2019-06-26 DIAGNOSIS — E1159 Type 2 diabetes mellitus with other circulatory complications: Secondary | ICD-10-CM | POA: Diagnosis not present

## 2019-06-26 DIAGNOSIS — E1129 Type 2 diabetes mellitus with other diabetic kidney complication: Secondary | ICD-10-CM | POA: Diagnosis not present

## 2019-06-26 DIAGNOSIS — E1165 Type 2 diabetes mellitus with hyperglycemia: Secondary | ICD-10-CM | POA: Diagnosis not present

## 2019-07-09 DIAGNOSIS — E119 Type 2 diabetes mellitus without complications: Secondary | ICD-10-CM | POA: Diagnosis not present

## 2019-07-12 ENCOUNTER — Other Ambulatory Visit: Payer: Self-pay

## 2019-07-21 DIAGNOSIS — G4733 Obstructive sleep apnea (adult) (pediatric): Secondary | ICD-10-CM | POA: Diagnosis not present

## 2019-08-21 DIAGNOSIS — G4733 Obstructive sleep apnea (adult) (pediatric): Secondary | ICD-10-CM | POA: Diagnosis not present

## 2019-09-18 DIAGNOSIS — Z794 Long term (current) use of insulin: Secondary | ICD-10-CM | POA: Diagnosis not present

## 2019-09-18 DIAGNOSIS — E1165 Type 2 diabetes mellitus with hyperglycemia: Secondary | ICD-10-CM | POA: Diagnosis not present

## 2019-09-18 DIAGNOSIS — E559 Vitamin D deficiency, unspecified: Secondary | ICD-10-CM | POA: Diagnosis not present

## 2019-09-18 DIAGNOSIS — E785 Hyperlipidemia, unspecified: Secondary | ICD-10-CM | POA: Diagnosis not present

## 2019-09-20 DIAGNOSIS — G4733 Obstructive sleep apnea (adult) (pediatric): Secondary | ICD-10-CM | POA: Diagnosis not present

## 2019-10-02 DIAGNOSIS — I152 Hypertension secondary to endocrine disorders: Secondary | ICD-10-CM | POA: Diagnosis not present

## 2019-10-02 DIAGNOSIS — E1159 Type 2 diabetes mellitus with other circulatory complications: Secondary | ICD-10-CM | POA: Diagnosis not present

## 2019-10-02 DIAGNOSIS — E1165 Type 2 diabetes mellitus with hyperglycemia: Secondary | ICD-10-CM | POA: Diagnosis not present

## 2019-10-02 DIAGNOSIS — E669 Obesity, unspecified: Secondary | ICD-10-CM | POA: Diagnosis not present

## 2019-10-02 DIAGNOSIS — E1142 Type 2 diabetes mellitus with diabetic polyneuropathy: Secondary | ICD-10-CM | POA: Diagnosis not present

## 2019-10-02 DIAGNOSIS — E559 Vitamin D deficiency, unspecified: Secondary | ICD-10-CM | POA: Diagnosis not present

## 2019-10-02 DIAGNOSIS — Z794 Long term (current) use of insulin: Secondary | ICD-10-CM | POA: Diagnosis not present

## 2019-10-02 DIAGNOSIS — E1129 Type 2 diabetes mellitus with other diabetic kidney complication: Secondary | ICD-10-CM | POA: Diagnosis not present

## 2019-10-02 DIAGNOSIS — E785 Hyperlipidemia, unspecified: Secondary | ICD-10-CM | POA: Diagnosis not present

## 2019-10-05 DIAGNOSIS — G4733 Obstructive sleep apnea (adult) (pediatric): Secondary | ICD-10-CM | POA: Diagnosis not present

## 2019-10-21 DIAGNOSIS — G4733 Obstructive sleep apnea (adult) (pediatric): Secondary | ICD-10-CM | POA: Diagnosis not present

## 2019-11-21 DIAGNOSIS — G4733 Obstructive sleep apnea (adult) (pediatric): Secondary | ICD-10-CM | POA: Diagnosis not present

## 2019-12-08 ENCOUNTER — Other Ambulatory Visit: Payer: Self-pay | Admitting: Family Medicine

## 2019-12-08 NOTE — Telephone Encounter (Signed)
Requested Prescriptions  Pending Prescriptions Disp Refills  . EQ ALLERGY RELIEF, CETIRIZINE, 10 MG tablet [Pharmacy Med Name: EQ Allergy Relief (Cetirizine) 10 MG Oral Tablet] 90 tablet 0    Sig: Take 1 tablet by mouth once daily     Ear, Nose, and Throat:  Antihistamines Passed - 12/08/2019  6:53 AM      Passed - Valid encounter within last 12 months    Recent Outpatient Visits          6 months ago Essential hypertension   [facility] [name], MD   1 year ago Obstructive sleep apnea   [facility] [name], MD   1 year ago Chest pain, unspecified type   [facility] [name], MD   1 year ago Right upper quadrant abdominal pain   [facility] [name], MD   2 years ago Annual physical exam   [facility] [name], MD

## 2019-12-21 DIAGNOSIS — G4733 Obstructive sleep apnea (adult) (pediatric): Secondary | ICD-10-CM | POA: Diagnosis not present

## 2019-12-26 ENCOUNTER — Other Ambulatory Visit: Payer: Self-pay | Admitting: Family Medicine

## 2019-12-26 DIAGNOSIS — E785 Hyperlipidemia, unspecified: Secondary | ICD-10-CM

## 2019-12-28 ENCOUNTER — Other Ambulatory Visit: Payer: Self-pay | Admitting: Family Medicine

## 2019-12-28 DIAGNOSIS — E785 Hyperlipidemia, unspecified: Secondary | ICD-10-CM

## 2020-01-02 ENCOUNTER — Ambulatory Visit (INDEPENDENT_AMBULATORY_CARE_PROVIDER_SITE_OTHER): Payer: Medicare PPO

## 2020-01-02 DIAGNOSIS — Z23 Encounter for immunization: Secondary | ICD-10-CM | POA: Diagnosis not present

## 2020-01-08 DIAGNOSIS — I152 Hypertension secondary to endocrine disorders: Secondary | ICD-10-CM | POA: Diagnosis not present

## 2020-01-08 DIAGNOSIS — E559 Vitamin D deficiency, unspecified: Secondary | ICD-10-CM | POA: Diagnosis not present

## 2020-01-08 DIAGNOSIS — E1142 Type 2 diabetes mellitus with diabetic polyneuropathy: Secondary | ICD-10-CM | POA: Diagnosis not present

## 2020-01-08 DIAGNOSIS — E669 Obesity, unspecified: Secondary | ICD-10-CM | POA: Diagnosis not present

## 2020-01-08 DIAGNOSIS — E785 Hyperlipidemia, unspecified: Secondary | ICD-10-CM | POA: Diagnosis not present

## 2020-01-08 DIAGNOSIS — E1165 Type 2 diabetes mellitus with hyperglycemia: Secondary | ICD-10-CM | POA: Diagnosis not present

## 2020-01-08 DIAGNOSIS — E1129 Type 2 diabetes mellitus with other diabetic kidney complication: Secondary | ICD-10-CM | POA: Diagnosis not present

## 2020-01-08 DIAGNOSIS — E1159 Type 2 diabetes mellitus with other circulatory complications: Secondary | ICD-10-CM | POA: Diagnosis not present

## 2020-01-08 DIAGNOSIS — Z794 Long term (current) use of insulin: Secondary | ICD-10-CM | POA: Diagnosis not present

## 2020-01-21 DIAGNOSIS — G4733 Obstructive sleep apnea (adult) (pediatric): Secondary | ICD-10-CM | POA: Diagnosis not present

## 2020-01-22 ENCOUNTER — Other Ambulatory Visit: Payer: Self-pay | Admitting: Family Medicine

## 2020-01-22 DIAGNOSIS — K219 Gastro-esophageal reflux disease without esophagitis: Secondary | ICD-10-CM

## 2020-01-22 NOTE — Telephone Encounter (Signed)
Requested Prescriptions  Pending Prescriptions Disp Refills   esomeprazole (NEXIUM) 40 MG capsule [Pharmacy Med Name: Esomeprazole Magnesium 40 MG Oral Capsule Delayed Release] 90 capsule 0    Sig: Take 1 capsule by mouth once daily     Gastroenterology: Proton Pump Inhibitors Passed - 01/22/2020  9:45 PM      Passed - Valid encounter within last 12 months    Recent Outpatient Visits          7 months ago Essential hypertension   [facility] [name], MD   1 year ago Obstructive sleep apnea   [facility] [name], MD   1 year ago Chest pain, unspecified type   [facility] [name], MD   1 year ago Right upper quadrant abdominal pain   [facility] [name], MD   2 years ago Annual physical exam   [facility] [name], MD

## 2020-01-30 DIAGNOSIS — F410 Panic disorder [episodic paroxysmal anxiety] without agoraphobia: Secondary | ICD-10-CM | POA: Diagnosis not present

## 2020-01-30 DIAGNOSIS — F330 Major depressive disorder, recurrent, mild: Secondary | ICD-10-CM | POA: Diagnosis not present

## 2020-02-07 DIAGNOSIS — G4733 Obstructive sleep apnea (adult) (pediatric): Secondary | ICD-10-CM | POA: Diagnosis not present

## 2020-02-09 ENCOUNTER — Other Ambulatory Visit: Payer: Self-pay | Admitting: Family Medicine

## 2020-02-09 DIAGNOSIS — I10 Essential (primary) hypertension: Secondary | ICD-10-CM

## 2020-02-09 DIAGNOSIS — I679 Cerebrovascular disease, unspecified: Secondary | ICD-10-CM

## 2020-02-20 DIAGNOSIS — G4733 Obstructive sleep apnea (adult) (pediatric): Secondary | ICD-10-CM | POA: Diagnosis not present

## 2020-03-22 DIAGNOSIS — G4733 Obstructive sleep apnea (adult) (pediatric): Secondary | ICD-10-CM | POA: Diagnosis not present

## 2020-04-01 ENCOUNTER — Other Ambulatory Visit: Payer: Self-pay | Admitting: Family Medicine

## 2020-04-08 DIAGNOSIS — E669 Obesity, unspecified: Secondary | ICD-10-CM | POA: Diagnosis not present

## 2020-04-08 DIAGNOSIS — E1142 Type 2 diabetes mellitus with diabetic polyneuropathy: Secondary | ICD-10-CM | POA: Diagnosis not present

## 2020-04-08 DIAGNOSIS — I152 Hypertension secondary to endocrine disorders: Secondary | ICD-10-CM | POA: Diagnosis not present

## 2020-04-08 DIAGNOSIS — E1165 Type 2 diabetes mellitus with hyperglycemia: Secondary | ICD-10-CM | POA: Diagnosis not present

## 2020-04-08 DIAGNOSIS — Z794 Long term (current) use of insulin: Secondary | ICD-10-CM | POA: Diagnosis not present

## 2020-04-08 DIAGNOSIS — E559 Vitamin D deficiency, unspecified: Secondary | ICD-10-CM | POA: Diagnosis not present

## 2020-04-08 DIAGNOSIS — E1129 Type 2 diabetes mellitus with other diabetic kidney complication: Secondary | ICD-10-CM | POA: Diagnosis not present

## 2020-04-08 DIAGNOSIS — E785 Hyperlipidemia, unspecified: Secondary | ICD-10-CM | POA: Diagnosis not present

## 2020-04-08 DIAGNOSIS — E1159 Type 2 diabetes mellitus with other circulatory complications: Secondary | ICD-10-CM | POA: Diagnosis not present

## 2020-04-08 LAB — HEMOGLOBIN A1C: Hemoglobin A1C: 9.8

## 2020-04-20 ENCOUNTER — Other Ambulatory Visit: Payer: Self-pay | Admitting: Family Medicine

## 2020-04-30 DIAGNOSIS — F410 Panic disorder [episodic paroxysmal anxiety] without agoraphobia: Secondary | ICD-10-CM | POA: Diagnosis not present

## 2020-04-30 DIAGNOSIS — F330 Major depressive disorder, recurrent, mild: Secondary | ICD-10-CM | POA: Diagnosis not present

## 2020-05-02 IMAGING — CR RIGHT RIBS - 2 VIEW
5 series · 6 of 6 positions shown · non-contrast
Comparison: None.

CLINICAL DATA: Right-sided rib pain

EXAM:
RIGHT RIBS - 2 VIEW

[Series 1: rib pa · 0.14mm/px · 2 of 2 slices shown (1 of 2)]
[im 1/2]
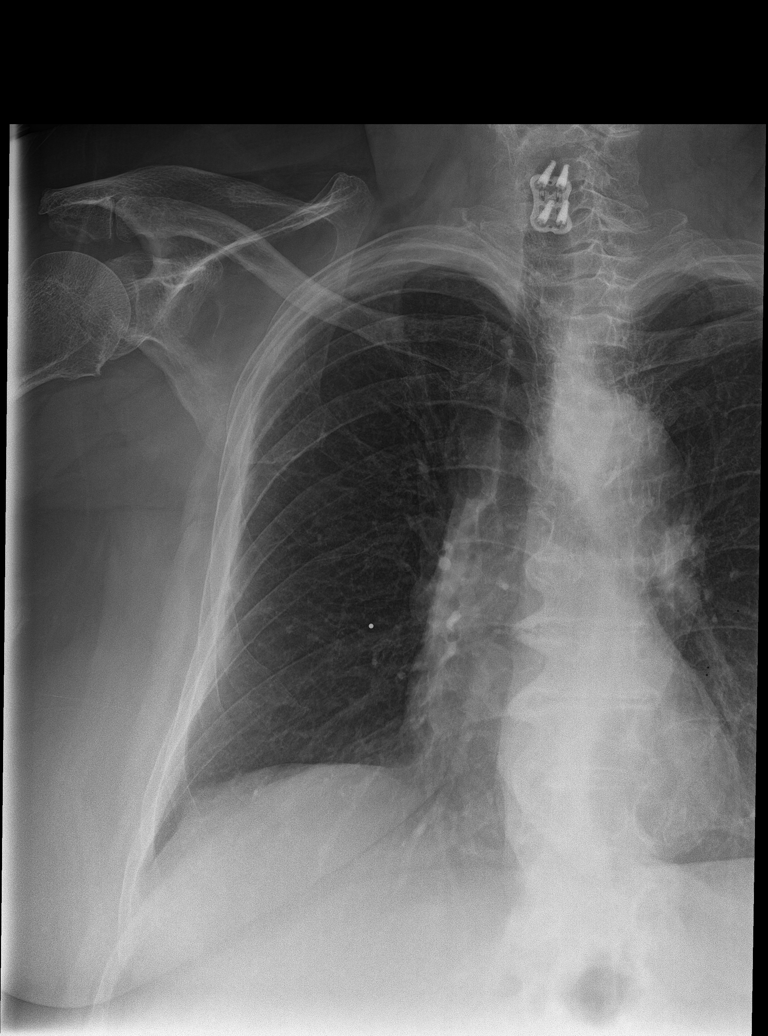
[im 2/2]
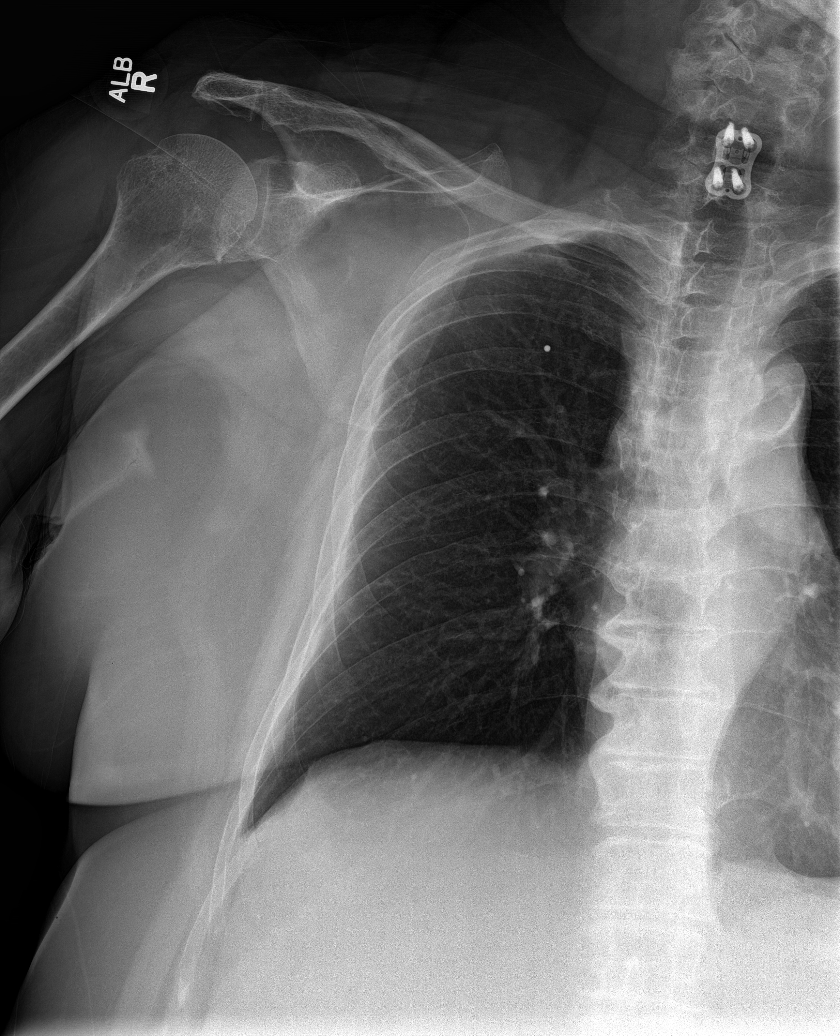

[rib pa (2 of 2)]
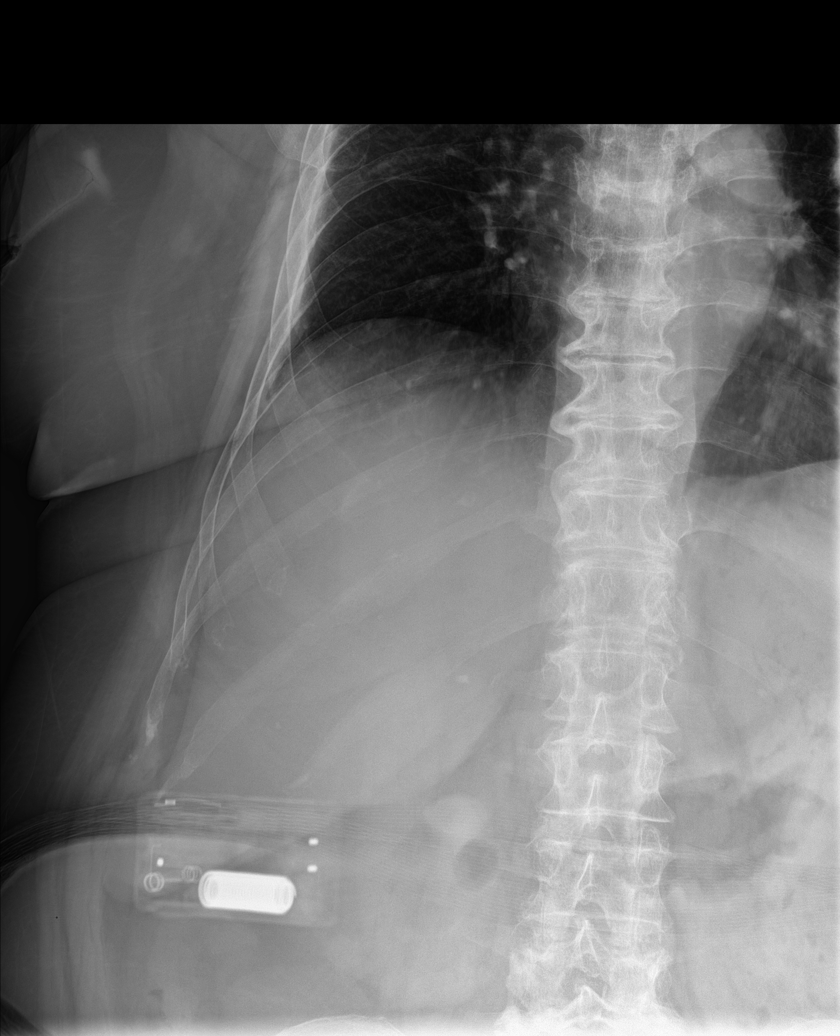

[rib obl (1 of 3)]
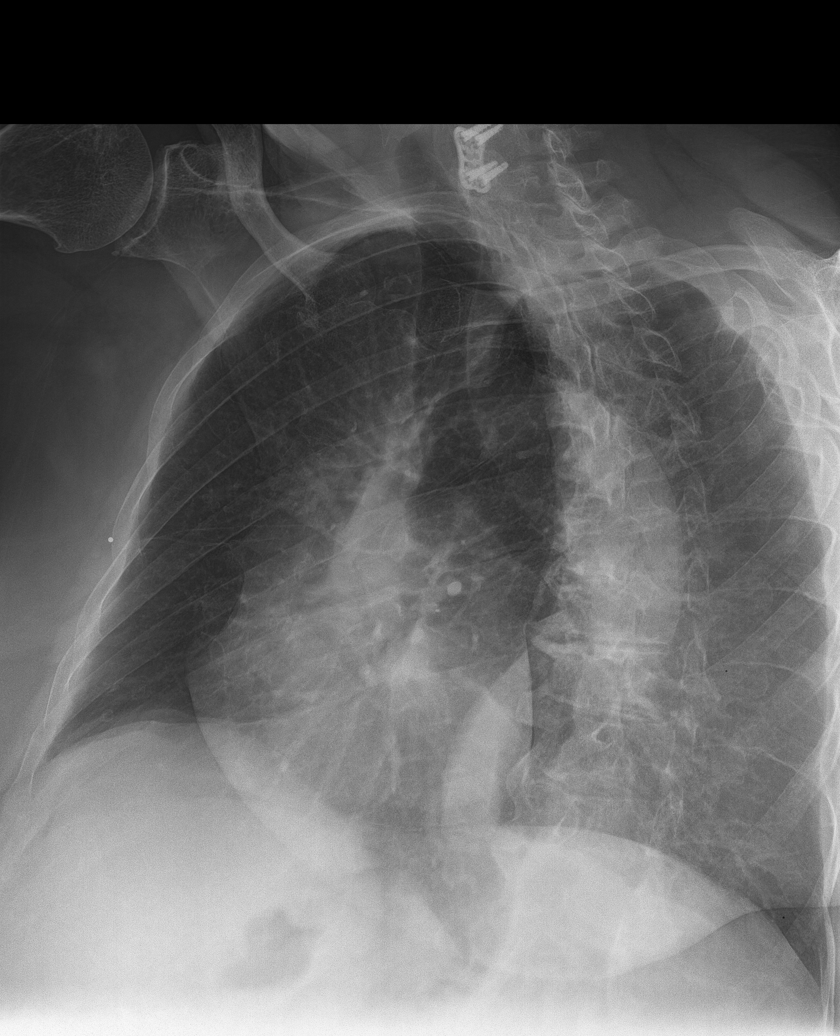

[rib obl (2 of 3)]
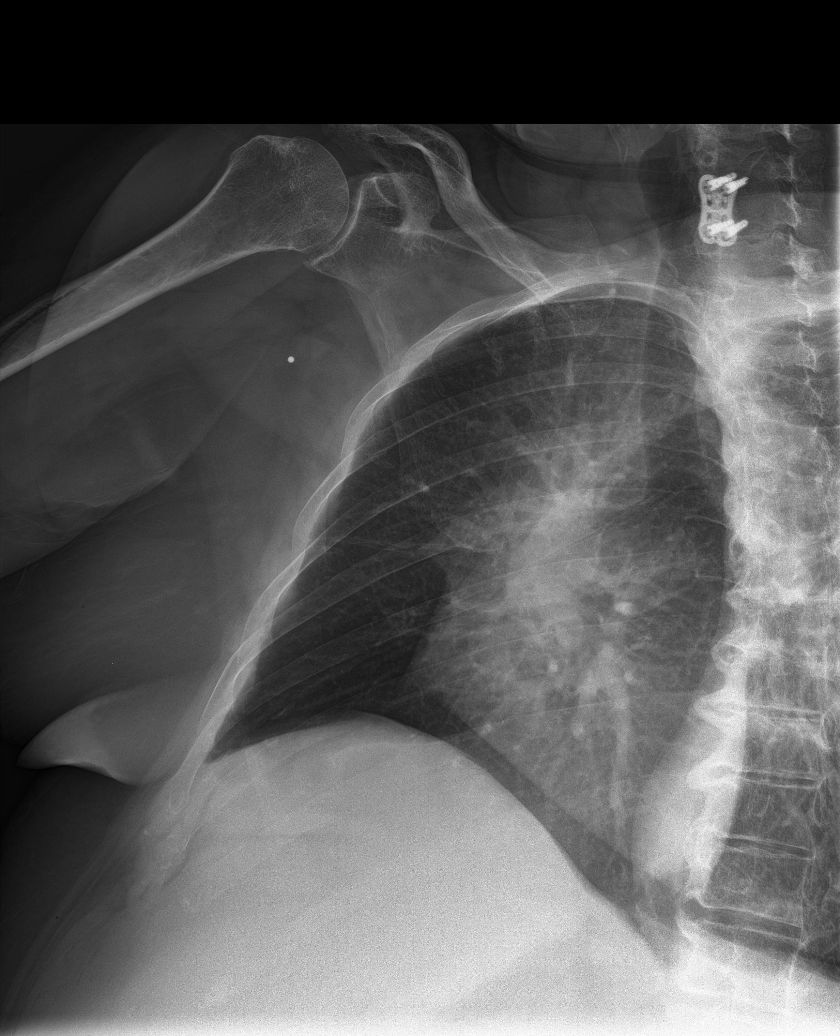

[rib obl (3 of 3)]
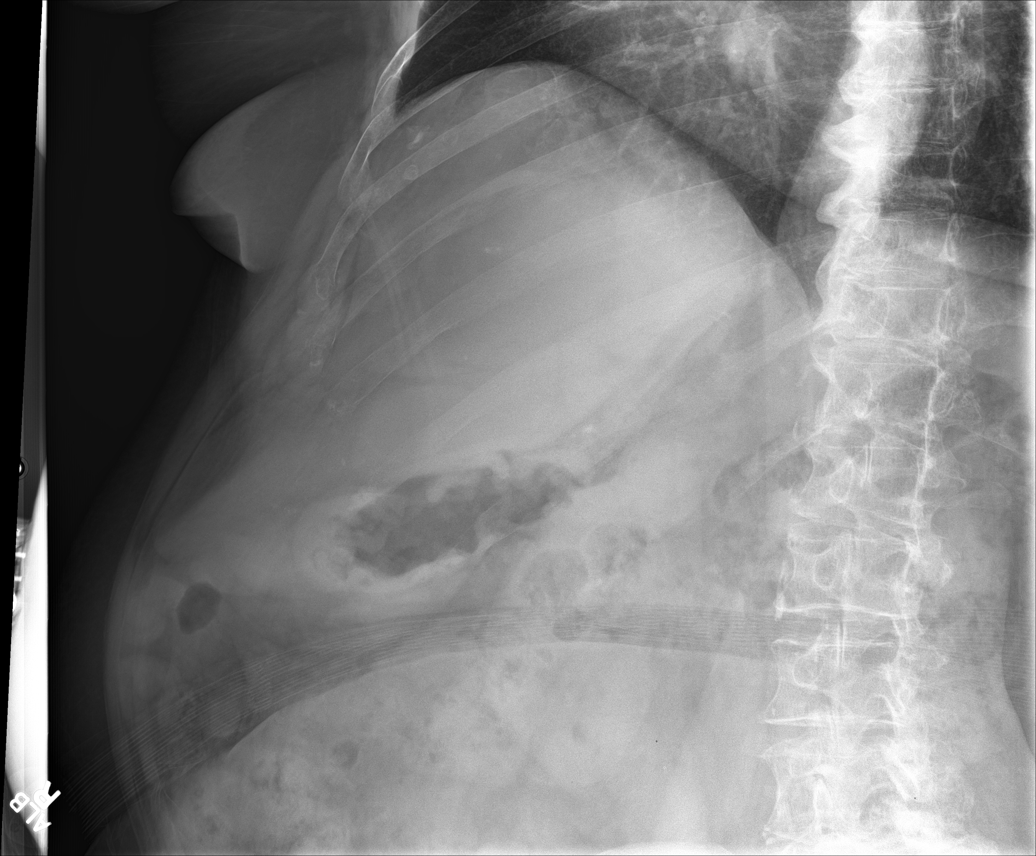

[6 of 6 positions shown; findings below may reference images not displayed]

FINDINGS: No fracture or other bone lesions are seen involving the ribs.
Carotid artery calcifications are noted on the right. The patient is
status post prior ACDF. Degenerative changes are noted of the right
glenohumeral joint.
IMPRESSION: No acute displaced right-sided rib fracture.

## 2020-05-02 IMAGING — CR CHEST - 2 VIEW
2 series · 2 of 2 positions shown · non-contrast
Comparison: Chest x-ray dated 03/10/2007.

CLINICAL DATA: Chest pain

EXAM:
CHEST - 2 VIEW

[chest pa]
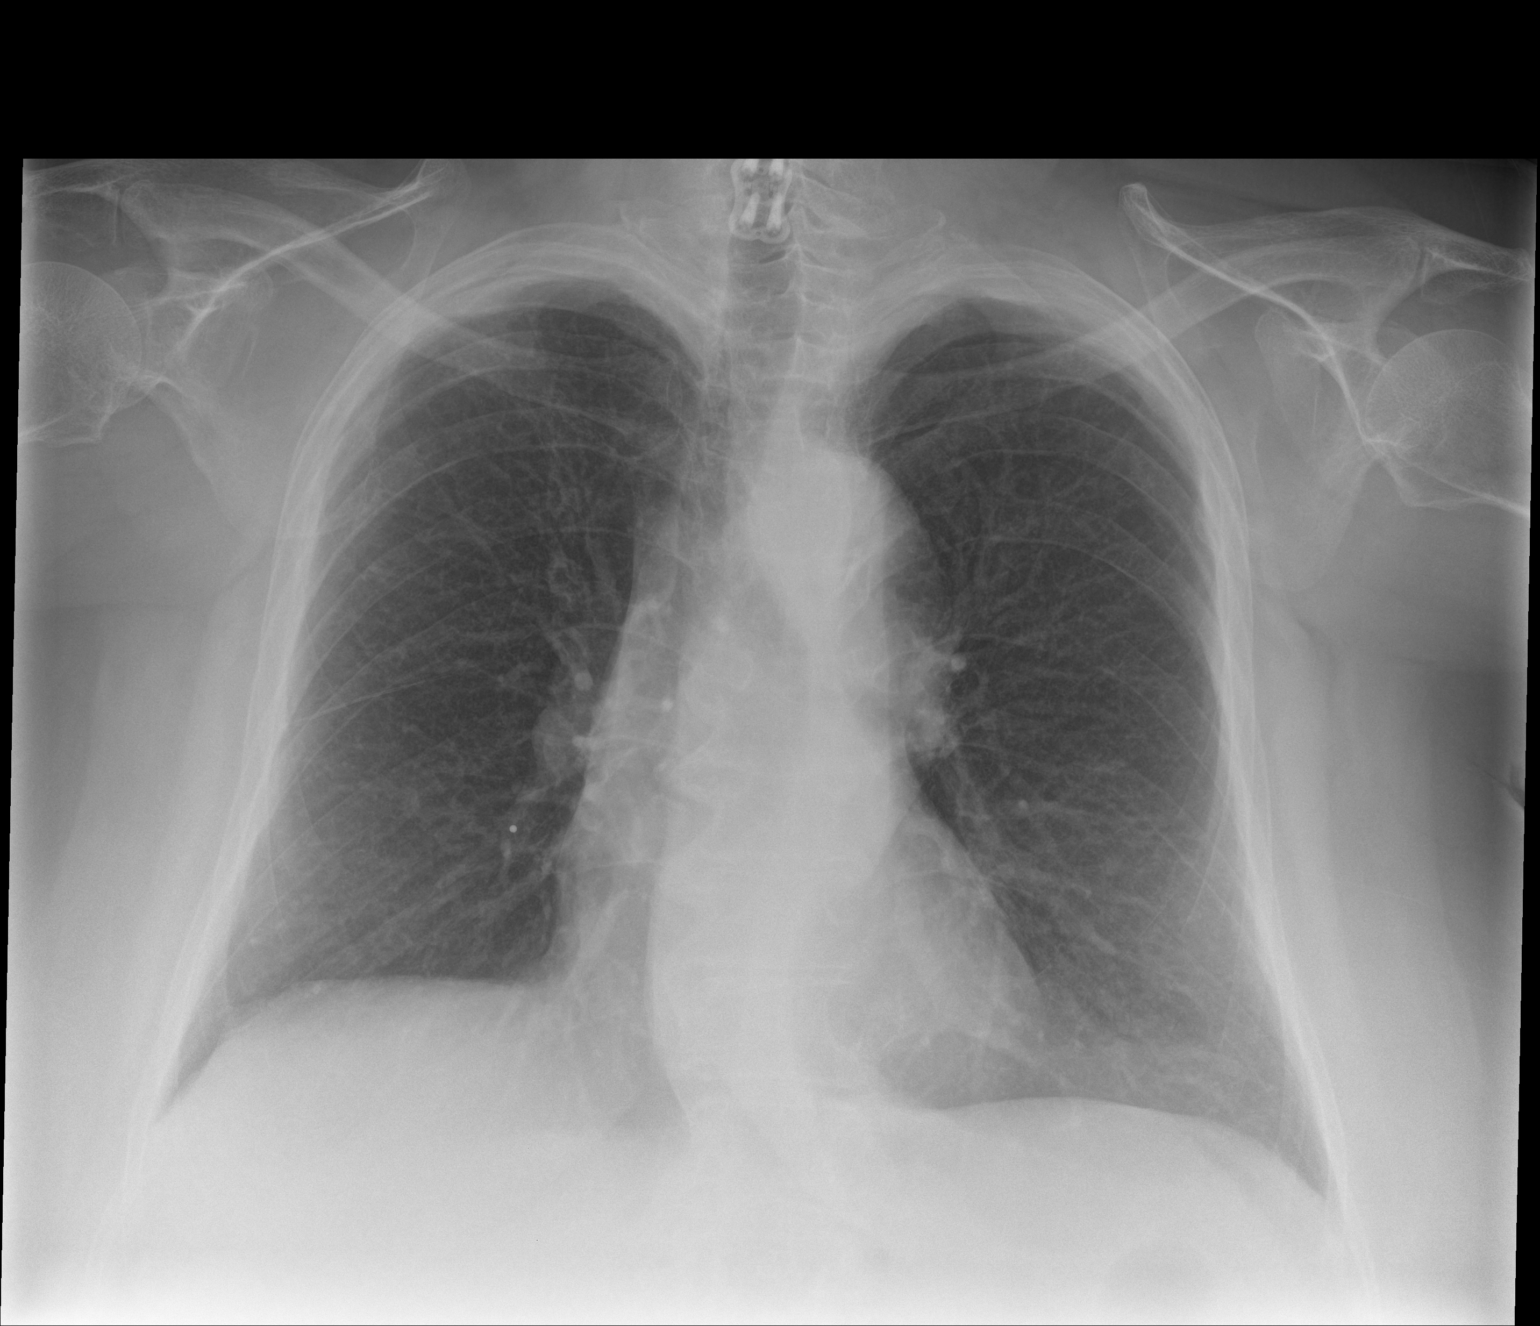

[chest lat]
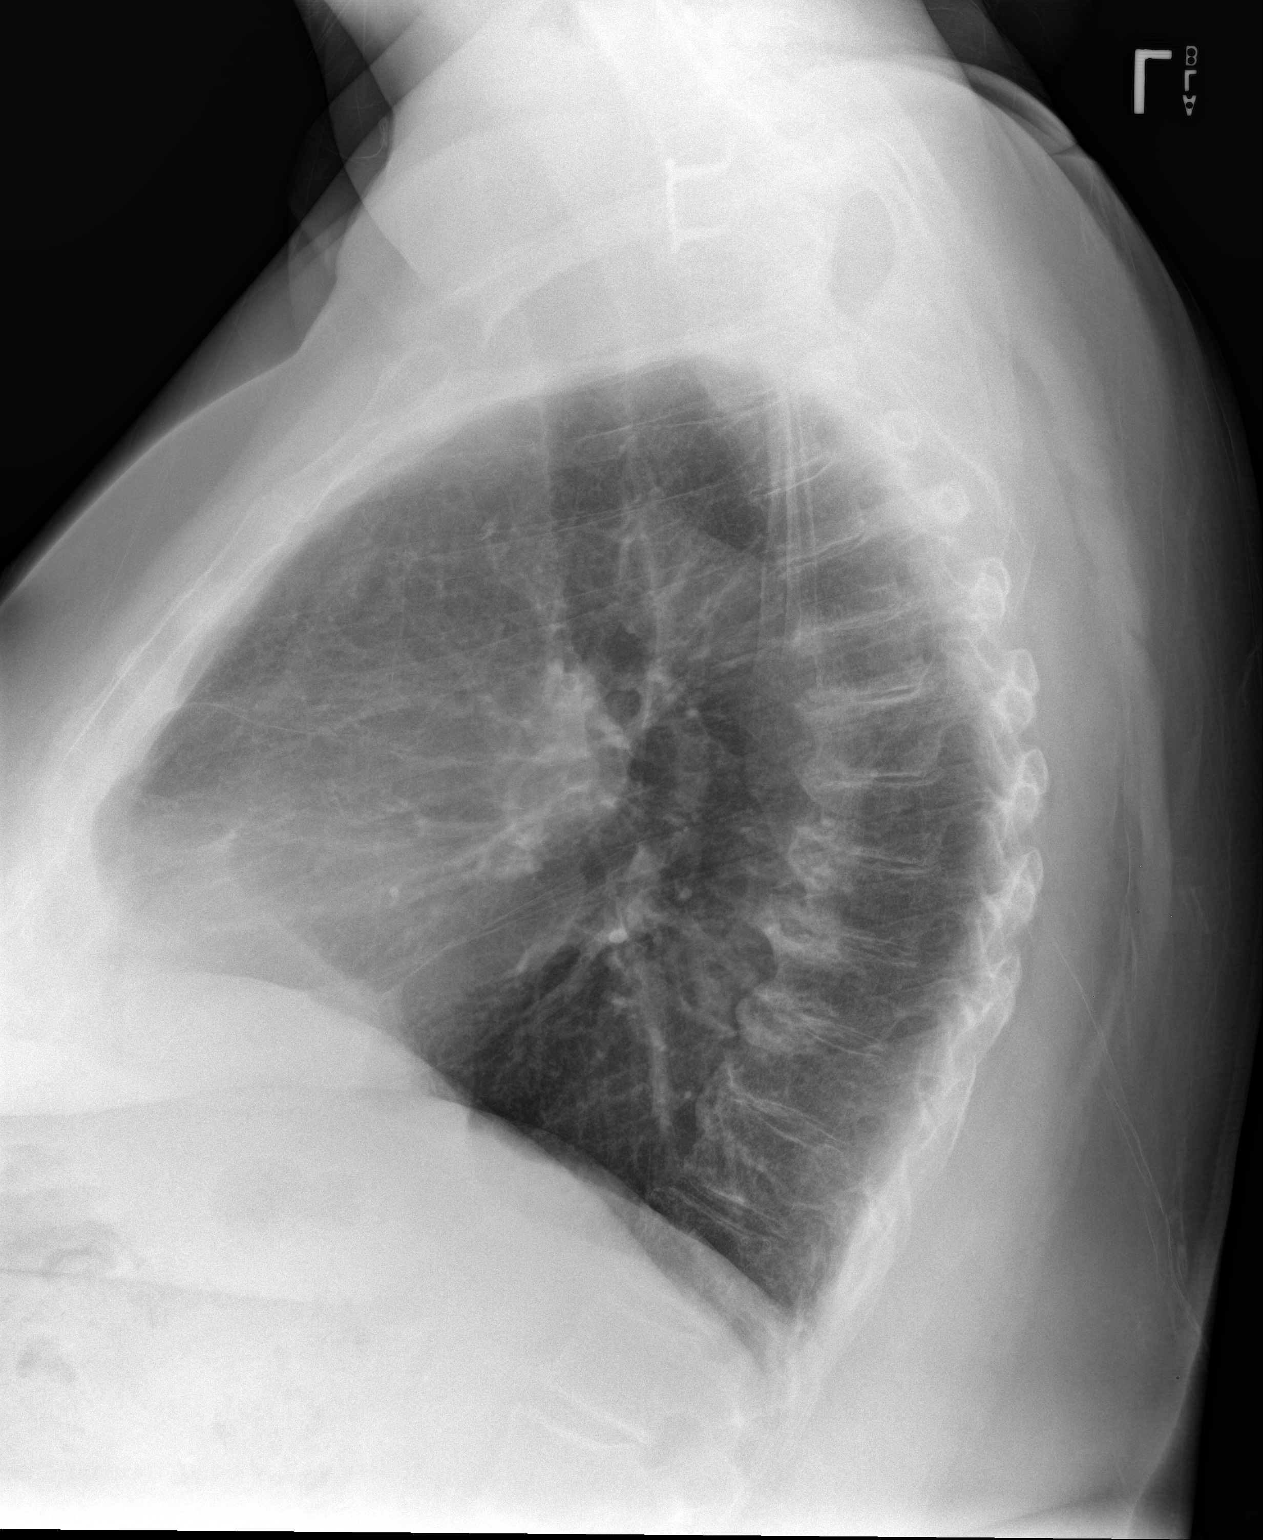

[2 of 2 positions shown; findings below may reference images not displayed]

FINDINGS: The heart size and mediastinal contours are within normal limits.
Both lungs are clear. The visualized skeletal structures are
unremarkable. The patient is status post prior ACDF of the lower
cervical spine.
IMPRESSION: No active cardiopulmonary disease.

## 2020-05-06 ENCOUNTER — Ambulatory Visit: Payer: Medicare PPO | Admitting: Family Medicine

## 2020-05-06 ENCOUNTER — Encounter: Payer: Self-pay | Admitting: Family Medicine

## 2020-05-06 ENCOUNTER — Other Ambulatory Visit: Payer: Self-pay

## 2020-05-06 VITALS — BP 120/74 | HR 98 | Ht 65.0 in | Wt 204.0 lb

## 2020-05-06 DIAGNOSIS — R197 Diarrhea, unspecified: Secondary | ICD-10-CM | POA: Diagnosis not present

## 2020-05-06 DIAGNOSIS — R350 Frequency of micturition: Secondary | ICD-10-CM | POA: Diagnosis not present

## 2020-05-06 DIAGNOSIS — E119 Type 2 diabetes mellitus without complications: Secondary | ICD-10-CM | POA: Diagnosis not present

## 2020-05-06 DIAGNOSIS — Z79899 Other long term (current) drug therapy: Secondary | ICD-10-CM | POA: Diagnosis not present

## 2020-05-06 DIAGNOSIS — R69 Illness, unspecified: Secondary | ICD-10-CM | POA: Diagnosis not present

## 2020-05-06 LAB — POCT URINALYSIS DIPSTICK
Bilirubin, UA: NEGATIVE
Glucose, UA: POSITIVE — AB
Leukocytes, UA: NEGATIVE
Nitrite, UA: NEGATIVE
Protein, UA: POSITIVE — AB
Spec Grav, UA: 1.015 (ref 1.010–1.025)
Urobilinogen, UA: 0.2 E.U./dL
pH, UA: 5 (ref 5.0–8.0)

## 2020-05-06 LAB — GLUCOSE, POCT (MANUAL RESULT ENTRY): POC Glucose: 231 mg/dl — AB (ref 70–99)

## 2020-05-06 NOTE — Progress Notes (Signed)
Date:  05/06/2020   Name:  Tracey Russo   DOB:  1950/05/12   MRN:  [id number]   Chief Complaint: Urinary Tract Infection and Diarrhea (Liquid, dark and green in color)  Urinary Tract Infection  This is a new problem. The current episode started in the past 7 days. The problem occurs intermittently. The quality of the pain is described as burning. The pain is mild (suprapubic). There has been no fever. Associated symptoms include chills, frequency, hematuria and urgency. Pertinent negatives include no discharge, flank pain, hesitancy, nausea, sweats or vomiting. The treatment provided moderate relief. There is no history of catheterization, kidney stones, recurrent UTIs, a single kidney, urinary stasis or a urological procedure. renal cyst  Diarrhea  This is a new problem. The current episode started more than 1 month ago (33month. Episode frequency: not daily/every other day. The problem has been waxing and waning. Diarrhea characteristics: almost black. Associated symptoms include bloating, chills and increased flatus. Pertinent negatives include no abdominal pain, arthralgias, coughing, fever, headaches, myalgias, sweats, URI, vomiting or weight loss. renal cyst    Lab Results  Component Value Date   CREATININE 0.92 05/29/2019   BUN 13 05/29/2019   NA 137 05/29/2019   K 4.1 05/29/2019   CL 99 05/29/2019   CO2 22 05/29/2019   Lab Results  Component Value Date   CHOL 265 (H) 05/29/2019   HDL 38 (L) 05/29/2019   LDLCALC 190 (H) 05/29/2019   TRIG 192 (H) 05/29/2019   CHOLHDL 2.5 06/02/2017   No results found for: TSH Lab Results  Component Value Date   HGBA1C 9.8 04/08/2020   Lab Results  Component Value Date   WBC 6.0 10/03/2017   HGB 13.6 10/03/2017   HCT 41.1 10/03/2017   MCV 93.4 10/03/2017   PLT 169 10/03/2017   Lab Results  Component Value Date   ALT 16 05/29/2019   AST 12 05/29/2019   ALKPHOS 110 05/29/2019   BILITOT 0.4 05/29/2019     Review of Systems   Constitutional: Positive for chills. Negative for fatigue, fever, unexpected weight change and weight loss.  HENT: Negative for congestion, ear discharge, ear pain, rhinorrhea, sinus pressure, sneezing and sore throat.   Eyes: Negative for double vision, photophobia, pain, discharge, redness and itching.  Respiratory: Negative for cough, shortness of breath, wheezing and stridor.   Gastrointestinal: Positive for bloating, diarrhea and flatus. Negative for abdominal pain, blood in stool, constipation, nausea and vomiting.  Endocrine: Negative for cold intolerance, heat intolerance, polydipsia, polyphagia and polyuria.  Genitourinary: Positive for frequency, hematuria and urgency. Negative for dysuria, flank pain, hesitancy, menstrual problem, pelvic pain, vaginal bleeding and vaginal discharge.  Musculoskeletal: Negative for arthralgias, back pain and myalgias.  Skin: Negative for rash.  Allergic/Immunologic: Negative for environmental allergies and food allergies.  Neurological: Negative for dizziness, weakness, light-headedness, numbness and headaches.  Hematological: Negative for adenopathy. Does not bruise/bleed easily.  Psychiatric/Behavioral: Negative for dysphoric mood. The patient is not nervous/anxious.     Patient Active Problem List   Diagnosis Date Noted  . LVH (left ventricular hypertrophy) due to hypertensive disease, without heart failure 08/15/2018  . Obesity (BMI 30-39.9) 05/02/2018  . Vitamin D deficiency 02/03/2018  . Stable angina pectoris ([place] 12/13/2017  . Bilateral carotid artery stenosis 12/13/2017  . Recurrent major depressive disorder ([place] 10/17/2017  . Mixed hyperlipidemia 12/22/2015  . Acute seasonal allergic rhinitis 12/22/2015  . Acute maxillary sinusitis 12/22/2015  . Incomplete bladder emptying 02/05/2014  .  Dyslipidemia 08/09/2013  . Gastroesophageal reflux disease 08/09/2013  . Essential hypertension 08/09/2013  . Diabetes mellitus, type 2 ([place])  08/09/2013  . Renal cyst, acquired, left 09/11/2012  . 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 Urge incontinence 09/11/2012  . Frank hematuria 05/08/2012  . Mixed incontinence 05/08/2012  . Renal colic 56/25/6389    Allergies  Allergen Reactions  . Ace Inhibitors Cough  . Corticosteroids     Other reaction(s): OTHER  . Nsaids     Other reaction(s): ANAPHYLAXIS    Past Surgical History:  Procedure Laterality Date  . CERVICAL FUSION  12/07/2000  . COLONOSCOPY  2006   Dr [name]  . CYSTOSTOMY  09/1984  . SHOULDER SURGERY Right 11/2006   tumor removed  . SUBDURAL HEMATOMA EVACUATION VIA CRANIOTOMY  04/16/2007  . TONSILECTOMY, ADENOIDECTOMY, BILATERAL MYRINGOTOMY AND TUBES  03/2001  . VAGINAL HYSTERECTOMY  02/23/2000    Social History   Tobacco Use  . Smoking status: Former Research scientist (life sciences)  . Smokeless tobacco: Never Used  Vaping Use  . Vaping Use: Never used  Substance Use Topics  . Alcohol use: Not Currently    Alcohol/week: 0.0 standard drinks  . Drug use: No     Medication list has been reviewed and updated.  Current Meds  Medication Sig  . amLODipine (NORVASC) 10 MG tablet Take 1 tablet by mouth once daily  . aspirin 81 MG tablet Take 81 mg by mouth daily.  . citalopram (CELEXA) 40 MG tablet Take 40 mg by mouth daily. Dr [name]  . clopidogrel (PLAVIX) 75 MG tablet Take 1 tablet by mouth once daily  . Cyanocobalamin 1000 MCG TBCR Take 1 tablet by mouth daily.  Noelle Penner ALLERGY RELIEF, CETIRIZINE, 10 MG tablet Take 1 tablet by mouth once daily  . esomeprazole (NEXIUM) 40 MG capsule Take 1 capsule by mouth once daily  . gabapentin (NEURONTIN) 300 MG capsule Take 1 capsule by mouth every 6 (six) hours as needed. endo  . glucose blood (ACCU-CHEK AVIVA PLUS) test strip USE ONE STRIP TO CHECK GLUCOSE THREE TIMES DAILY AS  DIRECTED  . hydrochlorothiazide (MICROZIDE) 12.5 MG capsule Take 1 capsule by mouth once daily  . insulin aspart (NOVOLOG) 100 UNIT/ML injection USE UP  TO 60 UNITS PER DAY IN VGO PUMP AS DIRECTED  . isosorbide mononitrate (IMDUR) 30 MG 24 hr tablet Take 1 tablet by mouth daily.  Marland Kitchen liraglutide (VICTOZA) 18 MG/3ML SOPN Inject 1.8 mg into the skin daily. Dr Eddie Dibbles  . metFORMIN (GLUCOPHAGE) 1000 MG tablet Take 1 tablet by mouth 2 (two) times daily. Dr Eddie Dibbles  . metoprolol succinate (TOPROL-XL) 50 MG 24 hr tablet TAKE 1 TABLET BY MOUTH ONCE DAILY TAKE WITH OR IMMEDIATELY FOLLOWING A MEAL  . rosuvastatin (CRESTOR) 20 MG tablet Take 1 tablet by mouth once daily    PHQ 2/9 Scores 05/06/2020 05/29/2019 05/23/2019 04/24/2018  PHQ - 2 Score 3 0 0 1  PHQ- 9 Score 10 0 - 2    GAD 7 : Generalized Anxiety Score 05/06/2020 05/29/2019  Nervous, Anxious, on Edge 1 0  Control/stop worrying 1 0  Worry too much - different things 2 0  Trouble relaxing 1 0  Restless 0 0  Easily annoyed or irritable 1 0  Afraid - awful might happen 0 0  Total GAD 7 Score 6 0  Anxiety Difficulty Not difficult at all -    BP Readings from Last 3 Encounters:  05/06/20 120/74  05/29/19 120/76  05/23/19 114/72  Physical Exam Vitals and nursing note reviewed.  Constitutional:      General: She is not in acute distress.    Appearance: She is not diaphoretic.  HENT:     Head: Normocephalic and atraumatic.     Right Ear: Tympanic membrane and external ear normal.     Left Ear: Tympanic membrane and external ear normal.     Nose: Nose normal.     Mouth/Throat:     Mouth: Oropharynx is clear and moist.  Eyes:     General:        Right eye: No discharge.        Left eye: No discharge.     Extraocular Movements: EOM normal.     Conjunctiva/sclera: Conjunctivae normal.     Pupils: Pupils are equal, round, and reactive to light.  Neck:     Thyroid: No thyromegaly.     Vascular: No JVD.  Cardiovascular:     Rate and Rhythm: Normal rate and regular rhythm.     Pulses: Intact distal pulses.     Heart sounds: Normal heart sounds. No murmur heard. No friction rub. No gallop.    Pulmonary:     Effort: Pulmonary effort is normal.     Breath sounds: Normal breath sounds.  Abdominal:     General: Bowel sounds are normal.     Palpations: Abdomen is soft. There is no mass.     Tenderness: There is no abdominal tenderness. There is no guarding.  Musculoskeletal:        General: No edema. Normal range of motion.     Cervical back: Normal range of motion and neck supple.  Lymphadenopathy:     Cervical: No cervical adenopathy.  Skin:    General: Skin is warm and dry.  Neurological:     Mental Status: She is alert.     Deep Tendon Reflexes: Reflexes are normal and symmetric.     Wt Readings from Last 3 Encounters:  05/06/20 204 lb (92.5 kg)  05/29/19 223 lb (101.2 kg)  05/23/19 221 lb 12.8 oz (100.6 kg)    BP 120/74   Pulse 98   Ht _0  (1.651 m)   Wt 204 lb (92.5 kg)   BMI 33.95 kg/m   Assessment and Plan: 1. Diarrhea, unspecified type New onset.  Persistent.  Intermittent.  Patient has had off-and-on diarrhea for 3 months.  This is been noted to be dark at times and patient has been taking Pepto-Bismol.  Exam of the guaiac was noted to be negative today and we will refer to gastroenterology for evaluation and treatment. - Ambulatory referral to Gastroenterology  2. Type 2 diabetes mellitus without complication, without long-term current use of insulin ([place]) Patient's been having concerns about urinary frequency.  We checked a urine which noted a large amount of glucose and we did a fingerstick glucose that was noted to be over 200.  Patient has been encouraged to return to her endocrinologist for adjustment of medications. - POCT Glucose (CBG)  3. Taking medication for chronic disease Patient is taking an SSRI for an extended period of time and her psychiatrist wish to have an EKG we did an EKG and it was noted that the QT interval was normal it was noted to be less than half the the preceding RR interval which is in normal limits for her age sex and  weight.  Copies were made for her psychiatrist for documentation.  Reading of the EKG is as follows:I  have reviewed EKG which shows rate 76 all intervals normal include including a QT interval when measured is less than half the preceding RR interval.  There is no voltage criteria met for cardiomyopathy or hypertrophy.  There is no ischemic changes noted.  EKG is read within normal limits for age.. Comparison to previous EKG dated 08/02/2018 which notes a QT of 404 and no ischemic changes. - POCT urinalysis dipstick - EKG 12-Lead  4. On SSRI therapy Patient has been on Celexa for a significant period of time for which there is concerned about cardiac involvement.  EKG is normal for age and patient has been encouraged to continue her Celexa until she sees her psychiatrist she does have a PHQ score today of 10 with a gad score of 11 and even though her psychiatrist is retiring we have encouraged her to continue with a psychiatrist of Dr. Reuel Derby choice.  5. Urinary frequency Patient has urinary frequency which has been off and on causing her issues and waking her at night.  We will refer to urology for evaluation of urinary frequency and urgency likely secondary to overactive bladder or incomplete emptying. - Ambulatory referral to Urology

## 2020-05-08 DIAGNOSIS — G4733 Obstructive sleep apnea (adult) (pediatric): Secondary | ICD-10-CM | POA: Diagnosis not present

## 2020-05-09 ENCOUNTER — Telehealth: Payer: Self-pay

## 2020-05-09 NOTE — Telephone Encounter (Signed)
Please call and tell her that she is scheduled for April 4th @ 10:30 in [place]. She is being put on a cancellation list. She may call [phone] next Wednesday and see if they have got anything- tell them "I am not feeling any better, can you squeeze me in"

## 2020-05-09 NOTE — Telephone Encounter (Unsigned)
Copied from [place] [id number]. Topic: General - Other >> May 09, 2020  9:08 AM Yvette Rack wrote: Reason for CRM: Pt called to make Baxter Flattery aware that she scheduled an appt with Urologist but she has not heard from GI for an appt.

## 2020-05-26 ENCOUNTER — Other Ambulatory Visit: Payer: Self-pay

## 2020-05-26 ENCOUNTER — Ambulatory Visit (INDEPENDENT_AMBULATORY_CARE_PROVIDER_SITE_OTHER): Payer: Medicare PPO

## 2020-05-26 VITALS — BP 130/76 | HR 72 | Temp 97.9°F | Resp 16 | Ht 65.0 in | Wt 212.4 lb

## 2020-05-26 DIAGNOSIS — Z0000 Encounter for general adult medical examination without abnormal findings: Secondary | ICD-10-CM | POA: Diagnosis not present

## 2020-05-26 NOTE — Progress Notes (Signed)
Subjective:   Tracey Russo is a 70 y.o. female who presents for Medicare Annual (Subsequent) preventive examination.  Review of Systems     Cardiac Risk Factors include: advanced age (>22mn, >>24women);diabetes mellitus;dyslipidemia;hypertension;obesity (BMI >30kg/m2)     Objective:    Today's Vitals   05/26/20 1526 05/26/20 1527  BP: 130/76   Pulse: 72   Resp: 16   Temp: 97.9 F (36.6 C)   TempSrc: Oral   SpO2: 97%   Weight: 212 lb 6.4 oz (96.3 kg)   Height: _0  (1.651 m)   PainSc:  2    Body mass index is 35.35 kg/m.  Advanced Directives 05/26/2020 10/17/2017 10/03/2017  Does Patient Have a Medical Advance Directive? No No No  Would patient like information on creating a medical advance directive? Yes (MAU/Ambulatory/Procedural Areas - Information given) No - Patient declined No - Patient declined    Current Medications (verified) Outpatient Encounter Medications as of 05/26/2020  Medication Sig  . amLODipine (NORVASC) 10 MG tablet Take 1 tablet by mouth once daily  . citalopram (CELEXA) 40 MG tablet Take 40 mg by mouth daily. Dr RThurmond Butts . clopidogrel (PLAVIX) 75 MG tablet Take 1 tablet by mouth once daily  . Cyanocobalamin 1000 MCG TBCR Take 1 tablet by mouth daily.  . empagliflozin (JARDIANCE) 25 MG TABS tablet Take 1 tablet by mouth daily.  .Noelle PennerALLERGY RELIEF, CETIRIZINE, 10 MG tablet Take 1 tablet by mouth once daily  . esomeprazole (NEXIUM) 40 MG capsule Take 1 capsule by mouth once daily  . gabapentin (NEURONTIN) 300 MG capsule Take 1 capsule by mouth every 6 (six) hours as needed. endo  . glipiZIDE (GLUCOTROL XL) 10 MG 24 hr tablet Take 1 tablet by mouth daily.  .Marland Kitchenglucose blood (ACCU-CHEK AVIVA PLUS) test strip USE ONE STRIP TO CHECK GLUCOSE THREE TIMES DAILY AS  DIRECTED  . hydrochlorothiazide (MICROZIDE) 12.5 MG capsule Take 1 capsule by mouth once daily  . insulin aspart (NOVOLOG) 100 UNIT/ML injection USE UP TO 60 UNITS PER DAY IN VGO PUMP AS DIRECTED  .  Insulin Disposable Pump (V-GO 40) KIT   . Insulin Pen Needle (PEN NEEDLES 31GX5/16") 31G X 8 MM MISC Use as directed E11.42  . isosorbide mononitrate (IMDUR) 30 MG 24 hr tablet Take 1 tablet by mouth daily.  .Marland Kitchenliraglutide (VICTOZA) 18 MG/3ML SOPN Inject 1.8 mg into the skin daily. [name] . melatonin 5 MG TABS Take 5 mg by mouth.  . metFORMIN (GLUCOPHAGE) 1000 MG tablet Take 1 tablet by mouth 2 (two) times daily. [name] . metoprolol succinate (TOPROL-XL) 50 MG 24 hr tablet TAKE 1 TABLET BY MOUTH ONCE DAILY TAKE WITH OR IMMEDIATELY FOLLOWING A MEAL  . rosuvastatin (CRESTOR) 20 MG tablet Take 1 tablet by mouth once daily  . losartan (COZAAR) 25 MG tablet Take 1 tablet by mouth daily.  . [DISCONTINUED] aspirin 81 MG tablet Take 81 mg by mouth daily.   No facility-administered encounter medications on file as of 05/26/2020.    Allergies (verified) Ace inhibitors, Corticosteroids, and Nsaids   History: Past Medical History:  Diagnosis Date  . Allergy   . Depression   . Diabetes mellitus without complication ([place]   . GERD (gastroesophageal reflux disease)   . Hyperlipidemia   . Hypertension   . Neuropathy    Past Surgical History:  Procedure Laterality Date  . CERVICAL FUSION  12/07/2000  . COLONOSCOPY  2006   Dr EVira Agar .  CYSTOSTOMY  09/1984  . SHOULDER SURGERY Right 11/2006   tumor removed  . SUBDURAL HEMATOMA EVACUATION VIA CRANIOTOMY  04/16/2007  . TONSILECTOMY, ADENOIDECTOMY, BILATERAL MYRINGOTOMY AND TUBES  03/2001  . VAGINAL HYSTERECTOMY  02/23/2000   Family History  Problem Relation Age of Onset  . Leukemia Paternal Grandfather    Social History   Socioeconomic History  . Marital status: Married    Spouse name: Not on file  . Number of children: Not on file  . Years of education: Not on file  . Highest education level: Not on file  Occupational History    Comment: retired  Tobacco Use  . Smoking status: Former Research scientist (life sciences)  . Smokeless tobacco: Never Used   Vaping Use  . Vaping Use: Never used  Substance and Sexual Activity  . Alcohol use: Not Currently    Alcohol/week: 0.0 standard drinks  . Drug use: No  . Sexual activity: Yes  Other Topics Concern  . Not on file  Social History Narrative  . Not on file   Social Determinants of Health   Financial Resource Strain: Low Risk   . Difficulty of Paying Living Expenses: Not hard at all  Food Insecurity: No Food Insecurity  . Worried About Charity fundraiser in the Last Year: Never true  . Ran Out of Food in the Last Year: Never true  Transportation Needs: No Transportation Needs  . Lack of Transportation (Medical): No  . Lack of Transportation (Non-Medical): No  Physical Activity: Inactive  . Days of Exercise per Week: 0 days  . Minutes of Exercise per Session: 0 min  Stress: Stress Concern Present  . Feeling of Stress : Rather much  Social Connections: Moderately Isolated  . Frequency of Communication with Friends and Family: More than three times a week  . Frequency of Social Gatherings with Friends and Family: Three times a week  . Attends Religious Services: Never  . Active Member of Clubs or Organizations: No  . Attends Archivist Meetings: Never  . Marital Status: Married    Tobacco Counseling Counseling given: Not Answered   Clinical Intake:  Pre-visit preparation completed: Yes  Pain : 0-10 Pain Score: 2  Pain Type: Acute pain Pain Location: Abdomen Pain Orientation: Lower,Right Pain Descriptors / Indicators: Dull Pain Onset: Today Pain Frequency: Constant     BMI - recorded: 35.35 Nutritional Status: BMI > 30  Obese Nutritional Risks: Nausea/ vomitting/ diarrhea (diarrhea; common for patient) Diabetes: Yes CBG done?: No Did pt. bring in CBG monitor from home?: No  How often do you need to have someone help you when you read instructions, pamphlets, or other written materials from your doctor or pharmacy?: 1 - Never  Nutrition Risk  Assessment:  Has the patient had any N/V/D within the last 2 months?  Yes  Does the patient have any non-healing wounds?  No  Has the patient had any unintentional weight loss or weight gain?  No   Diabetes:  Is the patient diabetic?  Yes  If diabetic, was a CBG obtained today?  No  Did the patient bring in their glucometer from home?  No  How often do you monitor your CBG's? Three times per day .   Financial Strains and Diabetes Management:  Are you having any financial strains with the device, your supplies or your medication? No .  Does the patient want to be seen by Chronic Care Management for management of their diabetes?  No  Would the patient  like to be referred to a Nutritionist or for Diabetic Management?  No   Diabetic Exams:  Diabetic Eye Exam: Completed 07/09/19.   Diabetic Foot Exam: Completed 12/07/18. Pt has been advised about the importance in completing this exam.   Interpreter Needed?: No  Information entered by :: Clemetine Marker LPN   Activities of Daily Living In your present state of health, do you have any difficulty performing the following activities: 05/26/2020  Hearing? N  Comment declines hearing aids  Vision? N  Difficulty concentrating or making decisions? N  Walking or climbing stairs? Y  Dressing or bathing? N  Doing errands, shopping? N  Preparing Food and eating ? N  Using the Toilet? N  In the past six months, have you accidently leaked urine? Y  Comment wears pads for protection  Do you have problems with loss of bowel control? N  Managing your Medications? N  Managing your Finances? N  Housekeeping or managing your Housekeeping? N  Some recent data might be hidden    Patient Care Team: [name], MD as PCP - General (Family Medicine) Warnell Forester, NP as Nurse Practitioner (Endocrinology)  Indicate any recent Medical Services you may have received from other than [facility] providers in the past year (date may be  approximate).     Assessment:   This is a routine wellness examination for [place].  Hearing/Vision screen  Hearing Screening   _0  _1  _2  _3  _4  _5  _6  _7  _8   Right ear:           Left ear:           Comments: Pt denies hearing difficulty  Vision Screening Comments: [facility] screenings [facility]  Dietary issues and exercise activities discussed: Current Exercise Habits: The patient does not participate in regular exercise at present, Exercise limited by: orthopedic condition(s)  Goals    . Increase physical activity     Pt would like to increase physical activity to at least 3 days per week      Depression Screen PHQ 2/9 Scores 05/26/2020 05/06/2020 05/29/2019 05/23/2019 04/24/2018 06/02/2017 05/05/2015  PHQ - 2 Score 2 3 0 0 1 0 0  PHQ- 9 Score 8 10 0 - 2 4 -    Fall Risk Fall Risk  05/26/2020 05/06/2020 05/29/2019 05/23/2019 06/02/2017  Falls in the past year? 0 0 1 1 No  Number falls in past yr: 0 - - 1 -  Injury with Fall? 0 - - 0 -  Risk for fall due to : Impaired balance/gait - - Impaired mobility;History of fall(s);Impaired balance/gait;Other (Comment) -  Risk for fall due to: Comment - - - neuropathy -  Follow up Falls prevention discussed Falls evaluation completed Falls evaluation completed - -    FALL RISK PREVENTION PERTAINING TO THE HOME:  Any stairs in or around the home? Yes  If so, are there any without handrails? No  Home free of loose throw rugs in walkways, pet beds, electrical cords, etc? Yes  Adequate lighting in your home to reduce risk of falls? Yes   ASSISTIVE DEVICES UTILIZED TO PREVENT FALLS:  Life alert? No  Use of a cane, walker or w/c? Yes  Grab bars in the bathroom? Yes  Shower chair or bench in shower? Yes  Elevated toilet seat or a handicapped toilet? Yes   TIMED UP AND GO:  Was the test performed? Yes .  Length of time to ambulate 10 feet: 5 sec.  Gait steady and fast without use of assistive  device  Cognitive Function: Normal cognitive status assessed by direct observation by this Nurse Health Advisor. No abnormalities found.      6CIT Screen 05/23/2019  What Year? 0 points  What month? 0 points  What time? 0 points  Count back from 20 0 points  Months in reverse 0 points  Repeat phrase 0 points  Total Score 0    Immunizations Immunization History  Administered Date(s) Administered  . Fluad Quad(high Dose 65+) 12/07/2018, 01/02/2020  . Influenza, High Dose Seasonal PF 12/06/2017  . Influenza,inj,Quad PF,6+ Mos 05/09/2015, 12/22/2015, 06/02/2017  . PFIZER(Purple Top)SARS-COV-2 Vaccination 05/07/2019, 06/05/2019, 02/28/2020  . Pneumococcal Conjugate-13 06/02/2017  . Pneumococcal Polysaccharide-23 12/07/2018  . Tdap 07/03/2013    TDAP status: Up to date  Flu Vaccine status: Up to date  Pneumococcal vaccine status: Up to date  Covid-19 vaccine status: Completed vaccines  Qualifies for Shingles Vaccine? Yes   Zostavax completed No   Shingrix Completed?: No.    Education has been provided regarding the importance of this vaccine. Patient has been advised to call insurance company to determine out of pocket expense if they have not yet received this vaccine. Advised may also receive vaccine at local pharmacy or Health Dept. Verbalized acceptance and understanding.  Screening Tests Health Maintenance  Topic Date Due  . URINE MICROALBUMIN  12/01/2019  . FOOT EXAM  12/07/2019  . MAMMOGRAM  05/28/2020 (Originally 01/02/2010)  . DEXA SCAN  05/28/2020 (Originally 07/09/2013)  . COLONOSCOPY (Pts 45-33yr Insurance coverage will need to be confirmed)  05/28/2020 (Originally 07/09/1993)  . Hepatitis C Screening  05/28/2020 (Originally [id number]  . OPHTHALMOLOGY EXAM  07/08/2020  . COVID-19 Vaccine (4 - Booster for Pfizer series) 08/28/2020  . HEMOGLOBIN A1C  10/06/2020  . TETANUS/TDAP  07/04/2023  . INFLUENZA VACCINE  Completed  . PNA vac Low Risk Adult  Completed  .  HPV VACCINES  Aged Out    Health Maintenance  Health Maintenance Due  Topic Date Due  . URINE MICROALBUMIN  12/01/2019  . FOOT EXAM  12/07/2019    Colorectal cancer screening: Type of screening: Colonoscopy. Completed 2006. Repeat every 10 years. Pt scheduled for consult with Dr. TAlice Reichert4/4/22.   Mammogram status: due; pt requests to postpone screening at this time  Bone density screening status: due; pt requests to postpone screening at this time.   Lung Cancer Screening: (Low Dose CT Chest recommended if Age 72-80years, 30 pack-year currently smoking OR have quit w/in 15years.) does not qualify.  Additional Screening:  Hepatitis C Screening: does qualify; postponed  Vision Screening: Recommended annual ophthalmology exams for early detection of glaucoma and other disorders of the eye. Is the patient up to date with their annual eye exam?  Yes  Who is the provider or what is the name of the office in which the patient attends annual eye exams[place] [facility]: Recommended annual dental exams for proper oral hygiene  Community Resource Referral / Chronic Care Management: CRR required this visit?  No   CCM required this visit?  No      Plan:     I have personally reviewed and noted the following in the patient's chart:   . Medical and social history . Use of alcohol, tobacco or illicit drugs  . Current medications and supplements . Functional ability and status . Nutritional status . Physical activity . Advanced directives . List of other physicians . Hospitalizations, surgeries,  and ER visits in previous 12 months . Vitals . Screenings to include cognitive, depression, and falls . Referrals and appointments  In addition, I have reviewed and discussed with patient certain preventive protocols, quality metrics, and best practice recommendations. A written personalized care plan for preventive services as well as general preventive health  recommendations were provided to patient.     Clemetine Marker, LPN   06/26/1593   Nurse Notes: pt c/o diarrhea episodes that typically occur on Wednesdays and last approx 2-6 hours. Pt states she does not have bowel movements between these episodes. Pt scheduled for GI consult 06/23/20.

## 2020-05-26 NOTE — Patient Instructions (Signed)
Tracey Russo , Thank you for taking time to come for your Medicare Wellness Visit. I appreciate your ongoing commitment to your health goals. Please review the following plan we discussed and let me know if I can assist you in the future.   Screening recommendations/referrals: Colonoscopy: due. Scheduled for consult with gastroenterology 06/23/20 Mammogram: due Bone Density: due Recommended yearly ophthalmology/optometry visit for glaucoma screening and checkup Recommended yearly dental visit for hygiene and checkup  Vaccinations: Influenza vaccine: done 01/02/20 Pneumococcal vaccine: done 12/07/18 Tdap vaccine: done 07/03/13 Shingles vaccine: Shingrix discussed. Please contact your pharmacy for coverage information.  Covid-19: done 05/07/19, 06/05/19 & 02/28/20  Advanced directives: Advance directive discussed with you today. I have provided a copy for you to complete at home and have notarized. Once this is complete please bring a copy in to our office so we can scan it into your chart.  Conditions/risks identified: Recommend increasing physical activity  Next appointment: Follow up in one year for your annual wellness visit    Preventive Care 65 Years and Older, Female Preventive care refers to lifestyle choices and visits with your health care provider that can promote health and wellness. What does preventive care include?  A yearly physical exam. This is also called an annual well check.  Dental exams once or twice a year.  Routine eye exams. Ask your health care provider how often you should have your eyes checked.  Personal lifestyle choices, including:  Daily care of your teeth and gums.  Regular physical activity.  Eating a healthy diet.  Avoiding tobacco and drug use.  Limiting alcohol use.  Practicing safe sex.  Taking low-dose aspirin every day.  Taking vitamin and mineral supplements as recommended by your health care provider. What happens during an annual  well check? The services and screenings done by your health care provider during your annual well check will depend on your age, overall health, lifestyle risk factors, and family history of disease. Counseling  Your health care provider may ask you questions about your:  Alcohol use.  Tobacco use.  Drug use.  Emotional well-being.  Home and relationship well-being.  Sexual activity.  Eating habits.  History of falls.  Memory and ability to understand (cognition).  Work and work Statistician.  Reproductive health. Screening  You may have the following tests or measurements:  Height, weight, and BMI.  Blood pressure.  Lipid and cholesterol levels. These may be checked every 5 years, or more frequently if you are over 22 years old.  Skin check.  Lung cancer screening. You may have this screening every year starting at age 37 if you have a 30-pack-year history of smoking and currently smoke or have quit within the past 15 years.  Fecal occult blood test (FOBT) of the stool. You may have this test every year starting at age 22.  Flexible sigmoidoscopy or colonoscopy. You may have a sigmoidoscopy every 5 years or a colonoscopy every 10 years starting at age 70.  Hepatitis C blood test.  Hepatitis B blood test.  Sexually transmitted disease (STD) testing.  Diabetes screening. This is done by checking your blood sugar (glucose) after you have not eaten for a while (fasting). You may have this done every 1-3 years.  Bone density scan. This is done to screen for osteoporosis. You may have this done starting at age 43.  Mammogram. This may be done every 1-2 years. Talk to your health care provider about how often you should have regular mammograms. Talk  with your health care provider about your test results, treatment options, and if necessary, the need for more tests. Vaccines  Your health care provider may recommend certain vaccines, such as:  Influenza vaccine. This  is recommended every year.  Tetanus, diphtheria, and acellular pertussis (Tdap, Td) vaccine. You may need a Td booster every 10 years.  Zoster vaccine. You may need this after age 7.  Pneumococcal 13-valent conjugate (PCV13) vaccine. One dose is recommended after age 23.  Pneumococcal polysaccharide (PPSV23) vaccine. One dose is recommended after age 76. Talk to your health care provider about which screenings and vaccines you need and how often you need them. This information is not intended to replace advice given to you by your health care provider. Make sure you discuss any questions you have with your health care provider. Document Released: 04/04/2015 Document Revised: 11/26/2015 Document Reviewed: 01/07/2015 Elsevier Interactive Patient Education  2017 [place] Prevention in the Home Falls can cause injuries. They can happen to people of all ages. There are many things you can do to make your home safe and to help prevent falls. What can I do on the outside of my home?  Regularly fix the edges of walkways and driveways and fix any cracks.  Remove anything that might make you trip as you walk through a door, such as a raised step or threshold.  Trim any bushes or trees on the path to your home.  Use bright outdoor lighting.  Clear any walking paths of anything that might make someone trip, such as rocks or tools.  Regularly check to see if handrails are loose or broken. Make sure that both sides of any steps have handrails.  Any raised decks and porches should have guardrails on the edges.  Have any leaves, snow, or ice cleared regularly.  Use sand or salt on walking paths during winter.  Clean up any spills in your garage right away. This includes oil or grease spills. What can I do in the bathroom?  Use night lights.  Install grab bars by the toilet and in the tub and shower. Do not use towel bars as grab bars.  Use non-skid mats or decals in the tub or  shower.  If you need to sit down in the shower, use a plastic, non-slip stool.  Keep the floor dry. Clean up any water that spills on the floor as soon as it happens.  Remove soap buildup in the tub or shower regularly.  Attach bath mats securely with double-sided non-slip rug tape.  Do not have throw rugs and other things on the floor that can make you trip. What can I do in the bedroom?  Use night lights.  Make sure that you have a light by your bed that is easy to reach.  Do not use any sheets or blankets that are too big for your bed. They should not hang down onto the floor.  Have a firm chair that has side arms. You can use this for support while you get dressed.  Do not have throw rugs and other things on the floor that can make you trip. What can I do in the kitchen?  Clean up any spills right away.  Avoid walking on wet floors.  Keep items that you use a lot in easy-to-reach places.  If you need to reach something above you, use a strong step stool that has a grab bar.  Keep electrical cords out of the way.  Do  not use floor polish or wax that makes floors slippery. If you must use wax, use non-skid floor wax.  Do not have throw rugs and other things on the floor that can make you trip. What can I do with my stairs?  Do not leave any items on the stairs.  Make sure that there are handrails on both sides of the stairs and use them. Fix handrails that are broken or loose. Make sure that handrails are as long as the stairways.  Check any carpeting to make sure that it is firmly attached to the stairs. Fix any carpet that is loose or worn.  Avoid having throw rugs at the top or bottom of the stairs. If you do have throw rugs, attach them to the floor with carpet tape.  Make sure that you have a light switch at the top of the stairs and the bottom of the stairs. If you do not have them, ask someone to add them for you. What else can I do to help prevent  falls?  Wear shoes that:  Do not have high heels.  Have rubber bottoms.  Are comfortable and fit you well.  Are closed at the toe. Do not wear sandals.  If you use a stepladder:  Make sure that it is fully opened. Do not climb a closed stepladder.  Make sure that both sides of the stepladder are locked into place.  Ask someone to hold it for you, if possible.  Clearly mark and make sure that you can see:  Any grab bars or handrails.  First and last steps.  Where the edge of each step is.  Use tools that help you move around (mobility aids) if they are needed. These include:  Canes.  Walkers.  Scooters.  Crutches.  Turn on the lights when you go into a dark area. Replace any light bulbs as soon as they burn out.  Set up your furniture so you have a clear path. Avoid moving your furniture around.  If any of your floors are uneven, fix them.  If there are any pets around you, be aware of where they are.  Review your medicines with your doctor. Some medicines can make you feel dizzy. This can increase your chance of falling. Ask your doctor what other things that you can do to help prevent falls. This information is not intended to replace advice given to you by your health care provider. Make sure you discuss any questions you have with your health care provider. Document Released: 01/02/2009 Document Revised: 08/14/2015 Document Reviewed: 04/12/2014 Elsevier Interactive Patient Education  2017 [employer].

## 2020-06-16 ENCOUNTER — Other Ambulatory Visit: Payer: Self-pay

## 2020-06-16 ENCOUNTER — Ambulatory Visit: Payer: Medicare PPO | Admitting: Urology

## 2020-06-16 VITALS — BP 138/76 | HR 77 | Ht 66.0 in | Wt 214.0 lb

## 2020-06-16 DIAGNOSIS — R350 Frequency of micturition: Secondary | ICD-10-CM

## 2020-06-16 NOTE — Progress Notes (Signed)
06/16/2020 2:02 PM   Barnabas Lister 06-16-1950 [id number]  Referring provider: [name], MD [address] [place],  [place] [address]  Chief Complaint  Patient presents with  . Urinary Frequency    HPI: I was consulted to assess the patient urinary incontinence.  She leaks with coughing sneezing bending lifting.  She has urge incontinence.  She has high-volume bedwetting.  She can soak 5 or 6 pads a day.  Urgency component more severe  She voids every 2 hours and gets up once a night.  Her flow was good  She said kidney stones no surgery.  She has had a hysterectomy.  She failed Myrbetriq.  She is an insulin-dependent diabetic.  She had a stroke.  She had neck surgery  Prone to diarrhea.   PMH: Past Medical History:  Diagnosis Date  . Allergy   . Depression   . Diabetes mellitus without complication ([place])   . GERD (gastroesophageal reflux disease)   . Hyperlipidemia   . Hypertension   . Neuropathy     Surgical History: Past Surgical History:  Procedure Laterality Date  . CERVICAL FUSION  12/07/2000  . COLONOSCOPY  2006   Dr [name]  . CYSTOSTOMY  09/1984  . SHOULDER SURGERY Right 11/2006   tumor removed  . SUBDURAL HEMATOMA EVACUATION VIA CRANIOTOMY  04/16/2007  . TONSILECTOMY, ADENOIDECTOMY, BILATERAL MYRINGOTOMY AND TUBES  03/2001  . VAGINAL HYSTERECTOMY  02/23/2000    Home Medications:  Allergies as of 06/16/2020      Reactions   Ace Inhibitors Cough   Corticosteroids    Other reaction(s): OTHER   Nsaids    Other reaction(s): ANAPHYLAXIS      Medication List       Accurate as of June 16, 2020  2:02 PM. If you have any questions, ask your nurse or doctor.        Accu-Chek Aviva Plus test strip Generic drug: glucose blood USE ONE STRIP TO CHECK GLUCOSE THREE TIMES DAILY AS  DIRECTED   amLODipine 10 MG tablet Commonly known as: NORVASC Take 1 tablet by mouth once daily   citalopram 40 MG tablet Commonly known as:  CELEXA Take 40 mg by mouth daily. Dr [name]   clopidogrel 75 MG tablet Commonly known as: PLAVIX Take 1 tablet by mouth once daily   Cyanocobalamin 1000 MCG Tbcr Take 1 tablet by mouth daily.   empagliflozin 25 MG Tabs tablet Commonly known as: JARDIANCE Take 1 tablet by mouth daily.   EQ Allergy Relief (Cetirizine) 10 MG tablet Generic drug: cetirizine Take 1 tablet by mouth once daily   esomeprazole 40 MG capsule Commonly known as: NEXIUM Take 1 capsule by mouth once daily   gabapentin 300 MG capsule Commonly known as: NEURONTIN Take 1 capsule by mouth every 6 (six) hours as needed. endo   glipiZIDE 10 MG 24 hr tablet Commonly known as: GLUCOTROL XL Take 1 tablet by mouth daily.   hydrochlorothiazide 12.5 MG capsule Commonly known as: MICROZIDE Take 1 capsule by mouth once daily   insulin aspart 100 UNIT/ML injection Commonly known as: novoLOG USE UP TO 60 UNITS PER DAY IN VGO PUMP AS DIRECTED   isosorbide mononitrate 30 MG 24 hr tablet Commonly known as: IMDUR Take 1 tablet by mouth daily.   liraglutide 18 MG/3ML Sopn Commonly known as: VICTOZA Inject 1.8 mg into the skin daily. Dr Eddie Dibbles   losartan 25 MG tablet Commonly known as: COZAAR Take 1 tablet by mouth  daily.   melatonin 5 MG Tabs Take 5 mg by mouth.   metFORMIN 1000 MG tablet Commonly known as: GLUCOPHAGE Take 1 tablet by mouth 2 (two) times daily. Dr Eddie Dibbles   metoprolol succinate 50 MG 24 hr tablet Commonly known as: TOPROL-XL TAKE 1 TABLET BY MOUTH ONCE DAILY TAKE WITH OR IMMEDIATELY FOLLOWING A MEAL   PEN NEEDLES 31GX5/16" 31G X 8 MM Misc Use as directed E11.42   rosuvastatin 20 MG tablet Commonly known as: CRESTOR Take 1 tablet by mouth once daily   V-Go 40 Kit       Allergies:  Allergies  Allergen Reactions  . Ace Inhibitors Cough  . Corticosteroids     Other reaction(s): OTHER  . Nsaids     Other reaction(s): ANAPHYLAXIS    Family History: Family History  Problem  Relation Age of Onset  . Leukemia Paternal Grandfather     Social History:  reports that she has quit smoking. She has never used smokeless tobacco. She reports previous alcohol use. She reports that she does not use drugs.  ROS:                                        Physical Exam: BP 138/76   Pulse 77   Ht '5\' 6"'  (1.676 m)   Wt 97.1 kg   BMI 34.54 kg/m   Constitutional:  Alert and oriented, No acute distress. HEENT: [place] AT, moist mucus membranes.  Trachea midline, no masses. Cardiovascular: No clubbing, cyanosis, or edema. Respiratory: Normal respiratory effort, no increased work of breathing. GI: Abdomen is soft, nontender, nondistended, no abdominal masses GU: No CVA tenderness.  Grade 1 cystocele.  Mild hypermobility the bladder neck and negative cough test with a mild to moderate cough Skin: No rashes, bruises or suspicious lesions. Lymph: No cervical or inguinal adenopathy. Neurologic: Grossly intact, no focal deficits, moving all 4 extremities. Psychiatric: Normal mood and affect.  Laboratory Data: Lab Results  Component Value Date   WBC 6.0 10/03/2017   HGB 13.6 10/03/2017   HCT 41.1 10/03/2017   MCV 93.4 10/03/2017   PLT 169 10/03/2017    Lab Results  Component Value Date   CREATININE 0.92 05/29/2019    No results found for: PSA  No results found for: TESTOSTERONE  Lab Results  Component Value Date   HGBA1C 9.8 04/08/2020    Urinalysis    Component Value Date/Time   BILIRUBINUR negative 05/06/2020 1623   PROTEINUR Positive (A) 05/06/2020 1623   UROBILINOGEN 0.2 05/06/2020 1623   NITRITE negative 05/06/2020 1623   LEUKOCYTESUR Negative 05/06/2020 1623    Pertinent Imaging: Urine reviewed.  Urine sent for culture.  Chart reviewed.  Assessment & Plan: Patient has mixed incontinence and high-volume bedwetting.  She soaks many pads a day.  She has frequency mild nocturia.  Role of urodynamics and future cystoscopy discussed.   She has neurogenic bladder risk factors likely not a neurogenic bladder  1. Urinary frequency  - Urinalysis, Complete   No follow-ups on file.  Reece Packer, MD  [facility] [address], [place], [place] [address] [phone]

## 2020-06-17 LAB — URINALYSIS, COMPLETE
Bilirubin, UA: NEGATIVE
Ketones, UA: NEGATIVE
Leukocytes,UA: NEGATIVE
Nitrite, UA: POSITIVE — AB
Protein,UA: NEGATIVE
RBC, UA: NEGATIVE
Specific Gravity, UA: 1.025 (ref 1.005–1.030)
Urobilinogen, Ur: 0.2 mg/dL (ref 0.2–1.0)
pH, UA: 5 (ref 5.0–7.5)

## 2020-06-17 LAB — MICROSCOPIC EXAMINATION

## 2020-06-19 LAB — CULTURE, URINE COMPREHENSIVE

## 2020-06-23 ENCOUNTER — Telehealth: Payer: Self-pay

## 2020-06-23 DIAGNOSIS — K5900 Constipation, unspecified: Secondary | ICD-10-CM | POA: Diagnosis not present

## 2020-06-23 DIAGNOSIS — R197 Diarrhea, unspecified: Secondary | ICD-10-CM | POA: Diagnosis not present

## 2020-06-23 DIAGNOSIS — R194 Change in bowel habit: Secondary | ICD-10-CM | POA: Diagnosis not present

## 2020-06-23 MED ORDER — NITROFURANTOIN MACROCRYSTAL 100 MG PO CAPS: 100.0000 mg | ORAL_CAPSULE | Freq: Two times a day (BID) | ORAL | 0 refills | Status: AC

## 2020-06-23 NOTE — Telephone Encounter (Signed)
-----   Message from Chrystie Nose, [place] sent at 06/23/2020  7:39 AM EDT -----  ----- Message ----- From: Bjorn Loser, MD Sent: 06/21/2020   5:01 PM EDT To: Chrystie Nose, CMA   Macrodantin 100 mg twice a day for 7 days  ----- Message ----- From: Chrystie Nose, CMA Sent: 06/18/2020   7:40 AM EDT To: Bjorn Loser, MD   ----- Message ----- From: Interface, [facility] Lab Results In Sent: 06/17/2020   1:36 PM EDT To: [facility]

## 2020-06-23 NOTE — Telephone Encounter (Signed)
Pt aware, sent medications to pharmacy.

## 2020-06-26 ENCOUNTER — Other Ambulatory Visit: Payer: Self-pay

## 2020-06-26 ENCOUNTER — Encounter: Payer: Self-pay | Admitting: Family Medicine

## 2020-06-26 ENCOUNTER — Ambulatory Visit (INDEPENDENT_AMBULATORY_CARE_PROVIDER_SITE_OTHER): Payer: Medicare PPO | Admitting: Family Medicine

## 2020-06-26 VITALS — BP 128/78 | HR 82 | Ht 66.0 in | Wt 212.0 lb

## 2020-06-26 DIAGNOSIS — E119 Type 2 diabetes mellitus without complications: Secondary | ICD-10-CM | POA: Diagnosis not present

## 2020-06-26 DIAGNOSIS — I679 Cerebrovascular disease, unspecified: Secondary | ICD-10-CM

## 2020-06-26 DIAGNOSIS — Z794 Long term (current) use of insulin: Secondary | ICD-10-CM

## 2020-06-26 DIAGNOSIS — Z01818 Encounter for other preprocedural examination: Secondary | ICD-10-CM

## 2020-06-26 DIAGNOSIS — I10 Essential (primary) hypertension: Secondary | ICD-10-CM | POA: Diagnosis not present

## 2020-06-26 NOTE — Progress Notes (Signed)
Date:  06/26/2020   Name:  Tracey Russo   DOB:  [birth year]   MRN:  [id number]   Chief Complaint: preop clearance (Needs form filled out for plavix for colonoscopy)  Patient is a 70 year old female who presents for a preoperative  exam. The patient reports the following problems: none. Health maintenance has been reviewed up to date.   Lab Results  Component Value Date   CREATININE 0.92 05/29/2019   BUN 13 05/29/2019   NA 137 05/29/2019   K 4.1 05/29/2019   CL 99 05/29/2019   CO2 22 05/29/2019   Lab Results  Component Value Date   CHOL 265 (H) 05/29/2019   HDL 38 (L) 05/29/2019   LDLCALC 190 (H) 05/29/2019   TRIG 192 (H) 05/29/2019   CHOLHDL 2.5 06/02/2017   No results found for: TSH Lab Results  Component Value Date   HGBA1C 9.8 04/08/2020   Lab Results  Component Value Date   WBC 6.0 10/03/2017   HGB 13.6 10/03/2017   HCT 41.1 10/03/2017   MCV 93.4 10/03/2017   PLT 169 10/03/2017   Lab Results  Component Value Date   ALT 16 05/29/2019   AST 12 05/29/2019   ALKPHOS 110 05/29/2019   BILITOT 0.4 05/29/2019     Review of Systems  Constitutional: Negative.  Negative for chills, fatigue, fever and unexpected weight change.  HENT: Negative for congestion, ear discharge, ear pain, rhinorrhea, sinus pressure, sneezing and sore throat.   Eyes: Negative for photophobia, pain, discharge, redness and itching.  Respiratory: Negative for cough, shortness of breath, wheezing and stridor.   Cardiovascular: Negative for chest pain, palpitations and leg swelling.  Gastrointestinal: Negative for abdominal pain, blood in stool, constipation, diarrhea, nausea and vomiting.  Endocrine: Negative for cold intolerance, heat intolerance, polydipsia, polyphagia and polyuria.  Genitourinary: Negative for dysuria, flank pain, frequency, hematuria, menstrual problem, pelvic pain, urgency, vaginal bleeding and vaginal discharge.  Musculoskeletal: Negative for arthralgias, back pain  and myalgias.  Skin: Negative for rash.  Allergic/Immunologic: Negative for environmental allergies and food allergies.  Neurological: Negative for dizziness, weakness, light-headedness, numbness and headaches.  Hematological: Negative for adenopathy. Does not bruise/bleed easily.  Psychiatric/Behavioral: Negative for dysphoric mood. The patient is not nervous/anxious.     Patient Active Problem List   Diagnosis Date Noted  . LVH (left ventricular hypertrophy) due to hypertensive disease, without heart failure 08/15/2018  . Obesity (BMI 30-39.9) 05/02/2018  . Vitamin D deficiency 02/03/2018  . Stable angina pectoris ([place]) 12/13/2017  . Bilateral carotid artery stenosis 12/13/2017  . Recurrent major depressive disorder ([place]) 10/17/2017  . Mixed hyperlipidemia 12/22/2015  . Acute seasonal allergic rhinitis 12/22/2015  . Acute maxillary sinusitis 12/22/2015  . Incomplete bladder emptying 02/05/2014  . Dyslipidemia 08/09/2013  . Gastroesophageal reflux disease 08/09/2013  . Essential hypertension 08/09/2013  . Diabetes mellitus, type 2 ([place]) 08/09/2013  . Renal cyst, acquired, left 09/11/2012  . 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 Urge incontinence 09/11/2012  . Frank hematuria 05/08/2012  . Mixed incontinence 05/08/2012  . Renal colic 39/76/7341    Allergies  Allergen Reactions  . Ace Inhibitors Cough  . Corticosteroids     Other reaction(s): OTHER  . Nsaids     Other reaction(s): ANAPHYLAXIS    Past Surgical History:  Procedure Laterality Date  . CERVICAL FUSION  12/07/2000  . COLONOSCOPY  2006   Dr [name]  . CYSTOSTOMY  09/1984  . SHOULDER SURGERY Right 11/2006  tumor removed  . SUBDURAL HEMATOMA EVACUATION VIA CRANIOTOMY  04/16/2007  . TONSILECTOMY, ADENOIDECTOMY, BILATERAL MYRINGOTOMY AND TUBES  03/2001  . VAGINAL HYSTERECTOMY  02/23/2000    Social History   Tobacco Use  . Smoking status: Former Research scientist (life sciences)  . Smokeless tobacco: Never Used  Vaping  Use  . Vaping Use: Never used  Substance Use Topics  . Alcohol use: Not Currently    Alcohol/week: 0.0 standard drinks  . Drug use: No     Medication list has been reviewed and updated.  Current Meds  Medication Sig  . amLODipine (NORVASC) 10 MG tablet Take 1 tablet by mouth once daily  . citalopram (CELEXA) 40 MG tablet Take 40 mg by mouth daily. Dr [name]  . clopidogrel (PLAVIX) 75 MG tablet Take 1 tablet by mouth once daily  . Cyanocobalamin 1000 MCG TBCR Take 1 tablet by mouth daily.  . empagliflozin (JARDIANCE) 25 MG TABS tablet Take 1 tablet by mouth daily.  Noelle Penner ALLERGY RELIEF, CETIRIZINE, 10 MG tablet Take 1 tablet by mouth once daily  . esomeprazole (NEXIUM) 40 MG capsule Take 1 capsule by mouth once daily  . gabapentin (NEURONTIN) 300 MG capsule Take 1 capsule by mouth every 6 (six) hours as needed. endo  . glipiZIDE (GLUCOTROL XL) 10 MG 24 hr tablet Take 2.5 tablets by mouth daily. 19m daily/ [facility]  . glucose blood (ACCU-CHEK AVIVA PLUS) test strip USE ONE STRIP TO CHECK GLUCOSE THREE TIMES DAILY AS  DIRECTED  . hydrochlorothiazide (MICROZIDE) 12.5 MG capsule Take 1 capsule by mouth once daily  . insulin aspart (NOVOLOG) 100 UNIT/ML injection USE UP TO 60 UNITS PER DAY IN VGO PUMP AS DIRECTED  . Insulin Disposable Pump (V-GO 40) KIT   . isosorbide mononitrate (IMDUR) 30 MG 24 hr tablet Take 1 tablet by mouth daily.  .Marland Kitchenliraglutide (VICTOZA) 18 MG/3ML SOPN Inject 1.8 mg into the skin daily. [name] . losartan (COZAAR) 25 MG tablet Take 1 tablet by mouth daily.  . melatonin 5 MG TABS Take 5 mg by mouth.  . metFORMIN (GLUCOPHAGE) 1000 MG tablet Take 1 tablet by mouth 2 (two) times daily. [name] . metoprolol succinate (TOPROL-XL) 50 MG 24 hr tablet TAKE 1 TABLET BY MOUTH ONCE DAILY TAKE WITH OR IMMEDIATELY FOLLOWING A MEAL  . nitrofurantoin (MACRODANTIN) 100 MG capsule Take 1 capsule (100 mg total) by mouth 2 (two) times daily for 7 days.  . rosuvastatin (CRESTOR) 20 MG  tablet Take 1 tablet by mouth once daily    PHQ 2/9 Scores 05/26/2020 05/06/2020 05/29/2019 05/23/2019  PHQ - 2 Score 2 3 0 0  PHQ- 9 Score 8 10 0 -    GAD 7 : Generalized Anxiety Score 05/06/2020 05/29/2019  Nervous, Anxious, on Edge 1 0  Control/stop worrying 1 0  Worry too much - different things 2 0  Trouble relaxing 1 0  Restless 0 0  Easily annoyed or irritable 1 0  Afraid - awful might happen 0 0  Total GAD 7 Score 6 0  Anxiety Difficulty Not difficult at all -    BP Readings from Last 3 Encounters:  06/26/20 128/78  06/16/20 138/76  05/26/20 130/76    Physical Exam Vitals and nursing note reviewed.  Constitutional:      Appearance: She is well-developed.  HENT:     Head: Normocephalic.     Right Ear: Tympanic membrane, ear canal and external ear normal.     Left Ear: Tympanic membrane,  ear canal and external ear normal.     Nose: Nose normal. No congestion or rhinorrhea.     Mouth/Throat:     Mouth: Mucous membranes are moist.     Pharynx: Oropharynx is clear. No oropharyngeal exudate or posterior oropharyngeal erythema.  Eyes:     General: Lids are everted, no foreign bodies appreciated. No scleral icterus.       Left eye: No foreign body or hordeolum.     Conjunctiva/sclera: Conjunctivae normal.     Right eye: Right conjunctiva is not injected.     Left eye: Left conjunctiva is not injected.     Pupils: Pupils are equal, round, and reactive to light.  Neck:     Thyroid: No thyromegaly.     Vascular: No JVD.     Trachea: No tracheal deviation.  Cardiovascular:     Rate and Rhythm: Normal rate and regular rhythm.     Pulses:          Dorsalis pedis pulses are 2+ on the right side and 2+ on the left side.       Posterior tibial pulses are 2+ on the right side and 2+ on the left side.     Heart sounds: Normal heart sounds. No murmur heard. No friction rub. No gallop.   Pulmonary:     Effort: Pulmonary effort is normal. No respiratory distress.     Breath sounds:  Normal breath sounds. No wheezing, rhonchi or rales.  Abdominal:     General: Bowel sounds are normal.     Palpations: Abdomen is soft. There is no mass.     Tenderness: There is no abdominal tenderness. There is no guarding or rebound.  Musculoskeletal:        General: No tenderness. Normal range of motion.     Cervical back: Normal range of motion and neck supple.  Feet:     Right foot:     Protective Sensation: 10 sites tested. 10 sites sensed.     Skin integrity: Skin integrity normal. No ulcer, blister, skin breakdown, erythema, warmth, callus, dry skin or fissure.     Toenail Condition: Right toenails are normal.     Left foot:     Protective Sensation: 10 sites tested. 10 sites sensed.     Skin integrity: Skin integrity normal. No ulcer, blister, skin breakdown, erythema, warmth, callus, dry skin or fissure.     Toenail Condition: Left toenails are normal.  Lymphadenopathy:     Cervical: No cervical adenopathy.  Skin:    General: Skin is warm.     Findings: No rash.  Neurological:     Mental Status: She is alert and oriented to person, place, and time.     Cranial Nerves: No cranial nerve deficit.     Deep Tendon Reflexes: Reflexes normal.  Psychiatric:        Mood and Affect: Mood is not anxious or depressed.     Wt Readings from Last 3 Encounters:  06/26/20 212 lb (96.2 kg)  06/16/20 214 lb (97.1 kg)  05/26/20 212 lb 6.4 oz (96.3 kg)    BP 128/78   Pulse 82   Ht _0  (1.676 m)   Wt 212 lb (96.2 kg)   BMI 34.22 kg/m   Assessment and Plan: 1. Preoperative clearance  No subjective/objective concerns noted during history and physical exam for surgical clearance.  Patient has no contraindications for surgery/colonoscopy.  2. Cerebrovascular disease Chronic.  Controlled.  Stable.  Patient is  currently controlled on Plavix 75 mg 1 a day.  She has been instructed to stop this at least 7 days prior to surgery.  3. Essential hypertension Chronic.  Controlled.   Stable.  Blood pressure is controlled currently at 128/78.  4. Type 2 diabetes mellitus without complication, with long-term current use of insulin (HCC) Chronic.  Controlled.  Stable.  Patient is followed by [facility].  Currently is on a regimen but last A1c is greater than 9.  Patient has an upcoming appointment with Deidre Ala on April 19.  Foot exam was done by myself today and it is normal

## 2020-07-01 DIAGNOSIS — E559 Vitamin D deficiency, unspecified: Secondary | ICD-10-CM | POA: Diagnosis not present

## 2020-07-01 DIAGNOSIS — E1129 Type 2 diabetes mellitus with other diabetic kidney complication: Secondary | ICD-10-CM | POA: Diagnosis not present

## 2020-07-01 DIAGNOSIS — E785 Hyperlipidemia, unspecified: Secondary | ICD-10-CM | POA: Diagnosis not present

## 2020-07-01 DIAGNOSIS — Z794 Long term (current) use of insulin: Secondary | ICD-10-CM | POA: Diagnosis not present

## 2020-07-01 DIAGNOSIS — R809 Proteinuria, unspecified: Secondary | ICD-10-CM | POA: Diagnosis not present

## 2020-07-01 DIAGNOSIS — E1165 Type 2 diabetes mellitus with hyperglycemia: Secondary | ICD-10-CM | POA: Diagnosis not present

## 2020-07-01 LAB — LIPID PANEL
Cholesterol: 254 — AB (ref 0–200)
HDL: 39 (ref 35–70)
LDL Cholesterol: 181
Triglycerides: 166 — AB (ref 40–160)

## 2020-07-01 LAB — MICROALBUMIN, URINE: Microalb, Ur: 61

## 2020-07-08 DIAGNOSIS — E1159 Type 2 diabetes mellitus with other circulatory complications: Secondary | ICD-10-CM | POA: Diagnosis not present

## 2020-07-08 DIAGNOSIS — Z794 Long term (current) use of insulin: Secondary | ICD-10-CM | POA: Diagnosis not present

## 2020-07-08 DIAGNOSIS — E669 Obesity, unspecified: Secondary | ICD-10-CM | POA: Diagnosis not present

## 2020-07-08 DIAGNOSIS — E1165 Type 2 diabetes mellitus with hyperglycemia: Secondary | ICD-10-CM | POA: Diagnosis not present

## 2020-07-08 DIAGNOSIS — E1142 Type 2 diabetes mellitus with diabetic polyneuropathy: Secondary | ICD-10-CM | POA: Diagnosis not present

## 2020-07-08 DIAGNOSIS — E1129 Type 2 diabetes mellitus with other diabetic kidney complication: Secondary | ICD-10-CM | POA: Diagnosis not present

## 2020-07-08 DIAGNOSIS — E785 Hyperlipidemia, unspecified: Secondary | ICD-10-CM | POA: Diagnosis not present

## 2020-07-08 DIAGNOSIS — E559 Vitamin D deficiency, unspecified: Secondary | ICD-10-CM | POA: Diagnosis not present

## 2020-07-08 DIAGNOSIS — I152 Hypertension secondary to endocrine disorders: Secondary | ICD-10-CM | POA: Diagnosis not present

## 2020-07-21 ENCOUNTER — Other Ambulatory Visit: Payer: Self-pay | Admitting: Urology

## 2020-07-21 ENCOUNTER — Other Ambulatory Visit: Payer: Self-pay | Admitting: Family Medicine

## 2020-07-21 NOTE — Telephone Encounter (Signed)
Requested medications are due for refill today.  unknown  Requested medications are on the active medications list.  no  Last refill. unknown  Future visit scheduled.   yes  Notes to clinic.  Please advise.

## 2020-07-25 ENCOUNTER — Encounter: Payer: Self-pay | Admitting: Urology

## 2020-07-28 DIAGNOSIS — F410 Panic disorder [episodic paroxysmal anxiety] without agoraphobia: Secondary | ICD-10-CM | POA: Diagnosis not present

## 2020-07-28 DIAGNOSIS — F330 Major depressive disorder, recurrent, mild: Secondary | ICD-10-CM | POA: Diagnosis not present

## 2020-07-29 ENCOUNTER — Encounter: Payer: Self-pay | Admitting: Internal Medicine

## 2020-07-30 ENCOUNTER — Encounter: Payer: Self-pay | Admitting: Internal Medicine

## 2020-07-30 ENCOUNTER — Ambulatory Visit: Payer: Medicare PPO | Admitting: Anesthesiology

## 2020-07-30 ENCOUNTER — Encounter
Admission: RE | Disposition: A | Discharge status: Home / Self Care | Payer: Self-pay | Source: Home / Self Care | Attending: Internal Medicine

## 2020-07-30 ENCOUNTER — Ambulatory Visit
Admission: RE | Admit: 2020-07-30 | Discharge: 2020-07-30 | Disposition: A | Discharge status: Home / Self Care | Payer: Medicare PPO | Attending: Internal Medicine | Admitting: Internal Medicine

## 2020-07-30 DIAGNOSIS — K640 First degree hemorrhoids: Secondary | ICD-10-CM | POA: Diagnosis not present

## 2020-07-30 DIAGNOSIS — Z794 Long term (current) use of insulin: Secondary | ICD-10-CM | POA: Diagnosis not present

## 2020-07-30 DIAGNOSIS — Z886 Allergy status to analgesic agent status: Secondary | ICD-10-CM | POA: Diagnosis not present

## 2020-07-30 DIAGNOSIS — R194 Change in bowel habit: Secondary | ICD-10-CM | POA: Diagnosis not present

## 2020-07-30 DIAGNOSIS — R197 Diarrhea, unspecified: Secondary | ICD-10-CM | POA: Diagnosis not present

## 2020-07-30 DIAGNOSIS — Z79899 Other long term (current) drug therapy: Secondary | ICD-10-CM | POA: Insufficient documentation

## 2020-07-30 DIAGNOSIS — K219 Gastro-esophageal reflux disease without esophagitis: Secondary | ICD-10-CM | POA: Insufficient documentation

## 2020-07-30 DIAGNOSIS — K5900 Constipation, unspecified: Secondary | ICD-10-CM | POA: Insufficient documentation

## 2020-07-30 DIAGNOSIS — K5730 Diverticulosis of large intestine without perforation or abscess without bleeding: Secondary | ICD-10-CM | POA: Diagnosis not present

## 2020-07-30 DIAGNOSIS — Z7902 Long term (current) use of antithrombotics/antiplatelets: Secondary | ICD-10-CM | POA: Insufficient documentation

## 2020-07-30 DIAGNOSIS — Z888 Allergy status to other drugs, medicaments and biological substances status: Secondary | ICD-10-CM | POA: Diagnosis not present

## 2020-07-30 DIAGNOSIS — Z7984 Long term (current) use of oral hypoglycemic drugs: Secondary | ICD-10-CM | POA: Insufficient documentation

## 2020-07-30 DIAGNOSIS — K649 Unspecified hemorrhoids: Secondary | ICD-10-CM | POA: Diagnosis not present

## 2020-07-30 HISTORY — PX: COLONOSCOPY WITH PROPOFOL: SHX5780

## 2020-07-30 HISTORY — DX: Obstructive sleep apnea (adult) (pediatric): G47.33

## 2020-07-30 HISTORY — DX: Secondary polycythemia: D75.1

## 2020-07-30 LAB — GLUCOSE, CAPILLARY: Glucose-Capillary: 194 mg/dL — ABNORMAL HIGH (ref 70–99)

## 2020-07-30 SURGERY — COLONOSCOPY WITH PROPOFOL
Anesthesia: General

## 2020-07-30 MED ORDER — SODIUM CHLORIDE 0.9 % IV SOLN: INTRAVENOUS | Status: DC

## 2020-07-30 MED ORDER — PROPOFOL 10 MG/ML IV BOLUS
INTRAVENOUS | Status: DC | PRN
  Administered 2020-07-30 (×2): 30 mg via INTRAVENOUS
  Administered 2020-07-30: 20 mg via INTRAVENOUS
  Administered 2020-07-30: 40 mg via INTRAVENOUS
  Administered 2020-07-30: 80 mg via INTRAVENOUS

## 2020-07-30 MED ORDER — PROPOFOL 10 MG/ML IV BOLUS
INTRAVENOUS | Status: AC
  Filled 2020-07-30: qty 40

## 2020-07-30 NOTE — Anesthesia Postprocedure Evaluation (Signed)
Anesthesia Post Note  Patient: Tracey Russo  Procedure(s) Performed: COLONOSCOPY WITH PROPOFOL (N/A )  Patient location during evaluation: Phase II Anesthesia Type: General Level of consciousness: awake and alert, awake and oriented Pain management: pain level controlled Vital Signs Assessment: post-procedure vital signs reviewed and stable Respiratory status: spontaneous breathing, nonlabored ventilation and respiratory function stable Cardiovascular status: blood pressure returned to baseline and stable Postop Assessment: no apparent nausea or vomiting Anesthetic complications: no   No complications documented.   Last Vitals:  Vitals:   07/30/20 0840 07/30/20 0850  BP: 109/71 108/80  Pulse:  60  Resp: 14 13  Temp: (!) 36 C   SpO2: 95% 94%    Last Pain:  Vitals:   07/30/20 0840  TempSrc: Temporal  PainSc:                  Phill Mutter

## 2020-07-30 NOTE — Anesthesia Preprocedure Evaluation (Signed)
Anesthesia Evaluation  Patient identified by MRN, date of birth, ID band Patient awake    Reviewed: Allergy & Precautions, NPO status , Patient's Chart, lab work & pertinent test results, reviewed documented beta blocker date and time   Airway Mallampati: III  TM Distance: >3 FB Neck ROM: Full    Dental no notable dental hx.    Pulmonary sleep apnea and Continuous Positive Airway Pressure Ventilation , former smoker,    Pulmonary exam normal        Cardiovascular hypertension, Pt. on medications and Pt. on home beta blockers + angina with exertion Normal cardiovascular exam     Neuro/Psych PSYCHIATRIC DISORDERS Depression negative neurological ROS     GI/Hepatic Neg liver ROS, Bowel prep,GERD  Medicated,  Endo/Other  negative endocrine ROSdiabetes, Well Controlled, Type 2, Insulin Dependent  Renal/GU Renal disease  negative genitourinary   Musculoskeletal negative musculoskeletal ROS (+)   Abdominal   Peds negative pediatric ROS (+)  Hematology negative hematology ROS (+)   Anesthesia Other Findings . Dyslipidemia  . GERD (gastroesophageal reflux disease)  . Hypertension  . OSA (obstructive sleep apnea)  . Polycythemia vera (CMS-HCC)  . Type 2 diabetes mellitus (CMS-HCC)    Reproductive/Obstetrics negative OB ROS                             Anesthesia Physical Anesthesia Plan  ASA: III  Anesthesia Plan: General   Post-op Pain Management:    Induction:   PONV Risk Score and Plan: 2 and Propofol infusion and TIVA  Airway Management Planned: Natural Airway and Nasal Cannula  Additional Equipment:   Intra-op Plan:   Post-operative Plan:   Informed Consent: I have reviewed the patients History and Physical, chart, labs and discussed the procedure including the risks, benefits and alternatives for the proposed anesthesia with the patient or authorized representative who has  indicated his/her understanding and acceptance.       Plan Discussed with: CRNA, Anesthesiologist and Surgeon  Anesthesia Plan Comments:         Anesthesia Quick Evaluation

## 2020-07-30 NOTE — Interval H&P Note (Signed)
History and Physical Interval Note:  07/30/2020 8:22 AM  Tracey Russo  has presented today for surgery, with the diagnosis of DIARRHEA CONSTIPATION.  The various methods of treatment have been discussed with the patient and family. After consideration of risks, benefits and other options for treatment, the patient has consented to  Procedure(s) with comments: COLONOSCOPY WITH PROPOFOL (N/A) - IDDM as a surgical intervention.  The patient's history has been reviewed, patient examined, no change in status, stable for surgery.  I have reviewed the patient's chart and labs.  Questions were answered to the patient's satisfaction.     [place], [place]

## 2020-07-30 NOTE — H&P (Signed)
Outpatient short stay form Pre-procedure 07/30/2020 8:20 AM Tracey Russo, M.D.  Primary Physician: [name], M.D.  Reason for visit: Change in bowel habits , constipation, diarrhea  History of present illness: 70 y/o female presents for persistent diarrhea admixed with constipation for several months. No bleeding, weight loss or abdominal pain.' Patient has GERD controlled with medication without alarm symptoms.     Current Facility-Administered Medications:  .  0.9 %  sodium chloride infusion, , Intravenous, Continuous, [name], [name], MD, Last Rate: 20 mL/hr at 07/30/20 0752, New Bag at 07/30/20 1601  Medications Prior to Admission  Medication Sig Dispense Refill Last Dose  . calcium-vitamin D (OSCAL WITH D) 500-200 MG-UNIT tablet Take 1 tablet by mouth.   07/28/2020  . gabapentin (NEURONTIN) 300 MG capsule Take 1 capsule by mouth every 6 (six) hours as needed. endo   07/29/2020  . insulin aspart (NOVOLOG) 100 UNIT/ML injection USE UP TO 60 UNITS PER DAY IN VGO PUMP AS DIRECTED   07/29/2020  . Insulin Disposable Pump (V-GO 40) KIT    07/30/2020 at Unknown time  . amLODipine (NORVASC) 10 MG tablet Take 1 tablet by mouth once daily 90 tablet 0 07/28/2020  . cetirizine (ZYRTEC) 10 MG tablet Take 1 tablet by mouth once daily 90 tablet 0 07/28/2020  . citalopram (CELEXA) 40 MG tablet Take 40 mg by mouth daily. Dr [name]   07/28/2020  . clopidogrel (PLAVIX) 75 MG tablet Take 1 tablet by mouth once daily 90 tablet 0 07/28/2020  . Cyanocobalamin 1000 MCG TBCR Take 1 tablet by mouth daily.   07/28/2020  . empagliflozin (JARDIANCE) 25 MG TABS tablet Take 1 tablet by mouth daily.   07/28/2020  . esomeprazole (NEXIUM) 40 MG capsule Take 1 capsule by mouth once daily 90 capsule 1 07/28/2020  . glipiZIDE (GLUCOTROL XL) 10 MG 24 hr tablet Take 2.5 tablets by mouth daily. 31m daily/ BPasty Arch  07/28/2020  . glucose blood (ACCU-CHEK AVIVA PLUS) test strip USE ONE STRIP TO CHECK GLUCOSE THREE TIMES DAILY AS   DIRECTED     . hydrochlorothiazide (MICROZIDE) 12.5 MG capsule Take 1 capsule by mouth once daily 90 capsule 0 07/28/2020  . isosorbide mononitrate (IMDUR) 30 MG 24 hr tablet Take 1 tablet by mouth daily.   07/28/2020  . liraglutide (VICTOZA) 18 MG/3ML SOPN Inject 1.8 mg into the skin daily. [name]  07/28/2020  . losartan (COZAAR) 25 MG tablet Take 1 tablet by mouth daily.     . melatonin 5 MG TABS Take 5 mg by mouth.   07/28/2020  . metFORMIN (GLUCOPHAGE) 1000 MG tablet Take 1 tablet by mouth 2 (two) times daily. [name]  07/28/2020  . metoprolol succinate (TOPROL-XL) 50 MG 24 hr tablet TAKE 1 TABLET BY MOUTH ONCE DAILY TAKE WITH OR IMMEDIATELY FOLLOWING A MEAL 90 tablet 0 07/28/2020  . rosuvastatin (CRESTOR) 20 MG tablet Take 1 tablet by mouth once daily 90 tablet 0 07/28/2020     Allergies  Allergen Reactions  . Ace Inhibitors Cough  . Corticosteroids     Other reaction(s): OTHER  . Nsaids     Other reaction(s): ANAPHYLAXIS     Past Medical History:  Diagnosis Date  . Allergy   . Depression   . Diabetes [place] complication ([place]   . GERD (gastroesophageal reflux disease)   . Hyperlipidemia   . Hypertension   . Neuropathy   . OSA (obstructive sleep apnea)   . Polycythemia  Review of systems:  Otherwise negative.    Physical Exam  Gen: Alert, oriented. Appears stated age.  HEENT: [place]/AT. PERRLA. Lungs: CTA, no wheezes. CV: RR nl S1, S2. Abd: soft, benign, no masses. BS+ Ext: No edema. Pulses 2+    Planned procedures: Proceed with colonoscopy. The patient understands the nature of the planned procedure, indications, risks, alternatives and potential complications including but not limited to bleeding, infection, perforation, damage to internal organs and possible oversedation/side effects from anesthesia. The patient agrees and gives consent to proceed.  Please refer to procedure notes for findings, recommendations and patient disposition/instructions.      Tracey Russo K. Tracey Russo, M.D. Gastroenterology 07/30/2020  8:20 AM

## 2020-07-30 NOTE — Op Note (Signed)
[facility] Gastroenterology Patient Name: Tracey Russo Procedure Date: 07/30/2020 7:54 AM MRN: [id number] Account #: [id number] Date of Birth: [birth year] Admit Type: Outpatient Age: 70 Room: [facility]OM 2 Gender: Female Note Status: Finalized Procedure:             Colonoscopy Indications:           Diarrhea, Change in bowel habits Providers:             [name]. Alice Reichert MD, MD Referring MD:          [name], MD (Referring MD) Medicines:             Propofol per Anesthesia Complications:         No immediate complications. Procedure:             Pre-Anesthesia Assessment:                        - The risks and benefits of the procedure and the                         sedation options and risks were discussed with the                         patient. All questions were answered and informed                         consent was obtained.                        - Patient identification and proposed procedure were                         verified prior to the procedure by the nurse. The                         procedure was verified in the procedure room.                        - After reviewing the risks and benefits, the patient                         was deemed in satisfactory condition to undergo the                         procedure.                        - ASA Grade Assessment: III - A patient with severe                         systemic disease.                        After obtaining informed consent, the colonoscope was                         passed under direct vision. Throughout the procedure,                         the patient's blood pressure,  pulse, and oxygen                         saturations were monitored continuously. The                         Colonoscope was introduced through the anus and                         advanced to the the cecum, identified by appendiceal                         orifice and ileocecal valve. The  colonoscopy was                         performed without difficulty. The patient tolerated                         the procedure well. The quality of the bowel                         preparation was good. The ileocecal valve, appendiceal                         orifice, and rectum were photographed. Findings:      The perianal and digital rectal examinations were normal. Pertinent       negatives include normal sphincter tone and no palpable rectal lesions.      Non-bleeding internal hemorrhoids were found during retroflexion. The       hemorrhoids were Grade I (internal hemorrhoids that do not prolapse).      Many small and large-mouthed diverticula were found in the entire colon.       There was no evidence of diverticular bleeding.      Normal mucosa was found in the entire colon. Biopsies for histology were       taken with a cold forceps from the random colon for evaluation of       microscopic colitis. Verification of patient identification for the       specimen was done by the nurse using the patient's medical record       number. Estimated blood loss: none.      The exam was otherwise without abnormality. Impression:            - Non-bleeding internal hemorrhoids.                        - Diverticulosis in the entire examined colon. There                         was no evidence of diverticular bleeding.                        - Normal mucosa in the entire examined colon. Biopsied.                        - The examination was otherwise normal. Recommendation:        - Patient has a contact number available for  emergencies. The signs and symptoms of potential                         delayed complications were discussed with the patient.                         Return to normal activities tomorrow. Written                         discharge instructions were provided to the patient.                        - Resume previous diet.                        -  Continue present medications.                        - Await pathology results.                        - No repeat colonoscopy due to current age (17 years                         or older) and the absence of colonic polyps.                        - Return to physician assistant in 2 months.                        - Follow up with Laurine Blazer, PA-C at [facility]. [phone].                        - You do NOT require further colon cancer screening                         measures (Annual stool testing (i.e. hemoccult, FIT,                         cologuard), sigmoidoscopy, colonoscopy or CT                         colonography). You should share this recommendation                         with your Primary Care provider.                        - The findings and recommendations were discussed with                         the patient. Procedure Code(s):     --- Professional ---                        [phone], Colonoscopy, flexible; with biopsy, single or  multiple Diagnosis Code(s):     --- Professional ---                        K57.30, Diverticulosis of large intestine without                         perforation or abscess without bleeding                        R19.4, Change in bowel habit                        R19.7, Diarrhea, unspecified                        K64.0, First degree hemorrhoids CPT copyright 2019 American Medical Association. All rights reserved. The codes documented in this report are preliminary and upon coder review may  be revised to meet current compliance requirements. Efrain Sella MD, MD 07/30/2020 8:42:25 AM This report has been signed electronically. Number of Addenda: 0 Note Initiated On: 07/30/2020 7:54 AM Scope Withdrawal Time: 0 hours 3 minutes 52 seconds  Total Procedure Duration: 0 hours 10 minutes 42 seconds  Estimated Blood Loss:  Estimated blood loss: none.      [facility]

## 2020-07-30 NOTE — Transfer of Care (Signed)
Immediate Anesthesia Transfer of Care Note  Patient: Tracey Russo  Procedure(s) Performed: COLONOSCOPY WITH PROPOFOL (N/A )  Patient Location: PACU and Endoscopy Unit  Anesthesia Type:General  Level of Consciousness: awake, alert  and oriented  Airway & Oxygen Therapy: Patient Spontanous Breathing  Post-op Assessment: Report given to RN and Post -op Vital signs reviewed and stable  Post vital signs: Reviewed and stable  Last Vitals:  Vitals Value Taken Time  BP 109/71 07/30/20 0842  Temp 36 C 07/30/20 0840  Pulse 65 07/30/20 0844  Resp 15 07/30/20 0844  SpO2 95 % 07/30/20 0844  Vitals shown include unvalidated device data.  Last Pain:  Vitals:   07/30/20 0840  TempSrc: Temporal  PainSc:          Complications: No complications documented.

## 2020-07-31 LAB — SURGICAL PATHOLOGY

## 2020-08-06 DIAGNOSIS — N3941 Urge incontinence: Secondary | ICD-10-CM | POA: Diagnosis not present

## 2020-08-06 DIAGNOSIS — G4733 Obstructive sleep apnea (adult) (pediatric): Secondary | ICD-10-CM | POA: Diagnosis not present

## 2020-08-11 ENCOUNTER — Encounter: Payer: Self-pay | Admitting: Urology

## 2020-08-11 ENCOUNTER — Ambulatory Visit: Payer: Medicare PPO | Admitting: Urology

## 2020-08-11 ENCOUNTER — Other Ambulatory Visit: Payer: Self-pay | Admitting: Urology

## 2020-08-11 ENCOUNTER — Other Ambulatory Visit: Payer: Self-pay

## 2020-08-11 VITALS — BP 145/83 | HR 78 | Ht 66.0 in | Wt 214.0 lb

## 2020-08-11 DIAGNOSIS — R350 Frequency of micturition: Secondary | ICD-10-CM

## 2020-08-11 DIAGNOSIS — N3946 Mixed incontinence: Secondary | ICD-10-CM

## 2020-08-11 MED ORDER — MIRABEGRON ER 50 MG PO TB24: 50.0000 mg | ORAL_TABLET | Freq: Every day | ORAL | 11 refills | Status: DC

## 2020-08-11 MED ORDER — FLUCONAZOLE 100 MG PO TABS: 100.0000 mg | ORAL_TABLET | Freq: Every day | ORAL | 0 refills | Status: DC

## 2020-08-11 NOTE — Progress Notes (Signed)
08/11/2020 1:11 PM   Tracey Russo [birth year] [id number]  Referring provider: [name], MD [address] [place],  [place] [address]  Chief Complaint  Patient presents with  . Cysto    HPI: I was consulted to assess the patient urinary incontinence.  She leaks with coughing sneezing bending lifting.  She has urge incontinence.  She has high-volume bedwetting.  She can soak 5 or 6 pads a day.  Urgency component more severe  She voids every 2 hours and gets up once a night.  Her flow was good  She said kidney stones no surgery.  She has had a hysterectomy.  She failed Myrbetriq.  She is an insulin-dependent diabetic.  She had a stroke.  She had neck surgery  Grade 1 cystocele.  Mild hypermobility the bladder neck and negative cough test with a mild to moderate cough  Patient has mixed incontinence and high-volume bedwetting.  She soaks many pads a day.  She has frequency mild nocturia.  Role of urodynamics and future cystoscopy discussed.  She has neurogenic bladder risk factors likely not a neurogenic bladder  Today Frequency stable.  Incontinence stable. On urodynamics patient had not voided and was catheterized for 100 mL.  Maximum bladder capacity was 530 mL.  Bladder was stable.  Patient had no stress incontinence with a Valsalva pressure of 80 cm of water.  During voluntary voiding patient voided 9 mL.  Maximum voiding pressure 11 cm of water.  Flow rate less than 3 mils per second.  Residual 130 mL.  Nonvideo study.  No stress incontinence with vaginal pack.  The details of the urodynamics are signed dictated      PMH: Past Medical History:  Diagnosis Date  . Allergy   . Depression   . Diabetes mellitus without complication ([place])   . GERD (gastroesophageal reflux disease)   . Hyperlipidemia   . Hypertension   . Neuropathy   . OSA (obstructive sleep apnea)   . Polycythemia     Surgical History: Past Surgical History:  Procedure Laterality  Date  . CERVICAL FUSION  12/07/2000  . COLONOSCOPY  2006   Dr [name]  . COLONOSCOPY WITH PROPOFOL N/A 07/30/2020   Procedure: COLONOSCOPY WITH PROPOFOL;  Surgeon: [name], [name], MD;  Location: [facility] ENDOSCOPY;  Service: Gastroenterology;  Laterality: N/A;  IDDM  . CYSTOSTOMY  09/1984  . SHOULDER SURGERY Right 11/2006   tumor removed  . SUBDURAL HEMATOMA EVACUATION VIA CRANIOTOMY  04/16/2007  . TONSILECTOMY, ADENOIDECTOMY, BILATERAL MYRINGOTOMY AND TUBES  03/2001  . VAGINAL HYSTERECTOMY  02/23/2000    Home Medications:  Allergies as of 08/11/2020      Reactions   Ace Inhibitors Cough   Corticosteroids    Other reaction(s): OTHER   Nsaids    Other reaction(s): ANAPHYLAXIS      Medication List       Accurate as of Aug 11, 2020  1:11 PM. If you have any questions, ask your nurse or doctor.        Accu-Chek Aviva Plus test strip Generic drug: glucose blood USE ONE STRIP TO CHECK GLUCOSE THREE TIMES DAILY AS  DIRECTED   amLODipine 10 MG tablet Commonly known as: NORVASC Take 1 tablet by mouth once daily   calcium-vitamin D 500-200 MG-UNIT tablet Commonly known as: OSCAL WITH D Take 1 tablet by mouth.   cetirizine 10 MG tablet Commonly known as: ZYRTEC Take 1 tablet by mouth once daily   citalopram 40 MG  tablet Commonly known as: CELEXA Take 40 mg by mouth daily. Dr [name]   clopidogrel 75 MG tablet Commonly known as: PLAVIX Take 1 tablet by mouth once daily   Cyanocobalamin 1000 MCG Tbcr Take 1 tablet by mouth daily.   empagliflozin 25 MG Tabs tablet Commonly known as: JARDIANCE Take 1 tablet by mouth daily.   esomeprazole 40 MG capsule Commonly known as: NEXIUM Take 1 capsule by mouth once daily   gabapentin 300 MG capsule Commonly known as: NEURONTIN Take 1 capsule by mouth every 6 (six) hours as needed. endo   glipiZIDE 10 MG 24 hr tablet Commonly known as: GLUCOTROL XL Take 2.5 tablets by mouth daily. 22m daily/ [place]   hydrochlorothiazide  12.5 MG capsule Commonly known as: MICROZIDE Take 1 capsule by mouth once daily   insulin aspart 100 UNIT/ML injection Commonly known as: novoLOG USE UP TO 60 UNITS PER DAY IN VGO PUMP AS DIRECTED   isosorbide mononitrate 30 MG 24 hr tablet Commonly known as: IMDUR Take 1 tablet by mouth daily.   liraglutide 18 MG/3ML Sopn Commonly known as: VICTOZA Inject 1.8 mg into the skin daily. [name]  losartan 25 MG tablet Commonly known as: COZAAR Take 1 tablet by mouth daily.   melatonin 5 MG Tabs Take 5 mg by mouth.   metFORMIN 1000 MG tablet Commonly known as: GLUCOPHAGE Take 1 tablet by mouth 2 (two) times daily. [name]  metoprolol succinate 50 MG 24 hr tablet Commonly known as: TOPROL-XL TAKE 1 TABLET BY MOUTH ONCE DAILY TAKE WITH OR IMMEDIATELY FOLLOWING A MEAL   rosuvastatin 20 MG tablet Commonly known as: CRESTOR Take 1 tablet by mouth once daily   V-Go 40 Kit       Allergies:  Allergies  Allergen Reactions  . Ace Inhibitors Cough  . Corticosteroids     Other reaction(s): OTHER  . Nsaids     Other reaction(s): ANAPHYLAXIS    Family History: Family History  Problem Relation Age of Onset  . Leukemia Paternal Grandfather   . Diabetes Mother   . Leukemia Father   . Goiter Sister     Social History:  reports that she has quit smoking. Her smoking use included cigarettes. She has a 0.75 pack-year smoking history. She has never used smokeless tobacco. She reports previous alcohol use. She reports that she does not use drugs.  ROS:                                        Physical Exam: There were no vitals taken for this visit.  Constitutional:  Alert and oriented, No acute distress.   Laboratory Data: Lab Results  Component Value Date   WBC 6.0 10/03/2017   HGB 13.6 10/03/2017   HCT 41.1 10/03/2017   MCV 93.4 10/03/2017   PLT 169 10/03/2017    Lab Results  Component Value Date   CREATININE 0.92 05/29/2019    No  results found for: PSA  No results found for: TESTOSTERONE  Lab Results  Component Value Date   HGBA1C 9.8 04/08/2020    Urinalysis    Component Value Date/Time   APPEARANCEUR Cloudy (A) 06/16/2020 1353   GLUCOSEU 3+ (A) 06/16/2020 1353   BILIRUBINUR Negative 06/16/2020 1353   PROTEINUR Negative 06/16/2020 1353   UROBILINOGEN 0.2 05/06/2020 1623   NITRITE Positive (A) 06/16/2020 1353   LEUKOCYTESUR Negative 06/16/2020  1353    Pertinent Imaging:   Assessment & Plan:  Based upon the urodynamics and presentation at least 80% of problem is overactive bladder.  She has mild stress incontinence.  Likely of third line therapy would be in her best interest.  Her bedwetting and high-volume urge incontinence is the primary complaints  Will perform cystoscopy next visit.  She had yeast in the urine.  I gave her 3 days of Diflucan.  I did not want a false positive today  1. Urinary frequency  - Urinalysis, Complete   No follow-ups on file.  Reece Packer, MD  [place] [address] [place], [place] [address] [phone]

## 2020-08-12 LAB — URINALYSIS, COMPLETE
Bilirubin, UA: NEGATIVE
Ketones, UA: NEGATIVE
Leukocytes,UA: NEGATIVE
Nitrite, UA: NEGATIVE
Protein,UA: NEGATIVE
Specific Gravity, UA: 1.015 (ref 1.005–1.030)
Urobilinogen, Ur: 0.2 mg/dL (ref 0.2–1.0)
pH, UA: 5 (ref 5.0–7.5)

## 2020-08-12 LAB — MICROSCOPIC EXAMINATION: Bacteria, UA: NONE SEEN

## 2020-08-14 LAB — CULTURE, URINE COMPREHENSIVE

## 2020-08-21 ENCOUNTER — Telehealth: Payer: Self-pay

## 2020-08-21 NOTE — Telephone Encounter (Signed)
Copied from [place] [phone]. Topic: General - Other >> Aug 21, 2020  3:09 PM Tessa Lerner A wrote: Reason for CRM: The patient has been directed, by their Psychiatrist, to inquire about an active prescription   Patient's Psychiatrist Dr. [name] will be retiring in August and would like to know if Dr. Ronnald Ramp would be able to continue writing patient's prescription for citalopram (CELEXA)  Please contact to further advise

## 2020-08-22 NOTE — Telephone Encounter (Signed)
Called and spoke with patient. Informed she needs to call Dr Leonette Most office and see who is taking over his patients and continue to see them for the medication and treatment per Dr Ronnald Ramp.   She verbalized understanding.

## 2020-09-24 DIAGNOSIS — F330 Major depressive disorder, recurrent, mild: Secondary | ICD-10-CM | POA: Diagnosis not present

## 2020-09-24 DIAGNOSIS — F410 Panic disorder [episodic paroxysmal anxiety] without agoraphobia: Secondary | ICD-10-CM | POA: Diagnosis not present

## 2020-09-30 DIAGNOSIS — K219 Gastro-esophageal reflux disease without esophagitis: Secondary | ICD-10-CM | POA: Diagnosis not present

## 2020-09-30 DIAGNOSIS — R194 Change in bowel habit: Secondary | ICD-10-CM | POA: Diagnosis not present

## 2020-09-30 DIAGNOSIS — R197 Diarrhea, unspecified: Secondary | ICD-10-CM | POA: Diagnosis not present

## 2020-10-06 ENCOUNTER — Other Ambulatory Visit: Payer: Self-pay

## 2020-10-06 ENCOUNTER — Ambulatory Visit: Payer: Medicare PPO | Admitting: Urology

## 2020-10-06 ENCOUNTER — Telehealth: Payer: Self-pay

## 2020-10-06 VITALS — BP 125/75 | HR 75 | Wt 214.0 lb

## 2020-10-06 DIAGNOSIS — N3946 Mixed incontinence: Secondary | ICD-10-CM

## 2020-10-06 LAB — MICROSCOPIC EXAMINATION: RBC, Urine: NONE SEEN /hpf (ref 0–2)

## 2020-10-06 LAB — URINALYSIS, COMPLETE
Bilirubin, UA: NEGATIVE
Ketones, UA: NEGATIVE
Leukocytes,UA: NEGATIVE
Nitrite, UA: NEGATIVE
RBC, UA: NEGATIVE
Specific Gravity, UA: 1.015 (ref 1.005–1.030)
Urobilinogen, Ur: 0.2 mg/dL (ref 0.2–1.0)
pH, UA: 5 (ref 5.0–7.5)

## 2020-10-06 MED ORDER — FLUCONAZOLE 100 MG PO TABS: 100.0000 mg | ORAL_TABLET | Freq: Every day | ORAL | 0 refills | Status: DC

## 2020-10-06 NOTE — Telephone Encounter (Signed)
Incoming call from pharmacist at [facility] to clarify script as it states take for 7 days but only 3 tablets were prescribed. Advised pharmacist to change RX to dispense 7 tablets.

## 2020-10-06 NOTE — Progress Notes (Signed)
10/06/2020 1:08 PM   Tracey Russo [birth year] [id number]  Referring provider: [name], MD [address] [place],  [place] [address]  No chief complaint on file.   HPI: I was consulted to assess the patient urinary incontinence.  She leaks with coughing sneezing bending lifting.  She has urge incontinence.  She has high-volume bedwetting.  She can soak 5 or 6 pads a day.  Urgency component more severe   She voids every 2 hours and gets up once a night.  Her flow was good  She has had a hysterectomy.  She failed Myrbetriq.   She is an insulin-dependent diabetic.  She had a stroke.  She had neck surgery   Grade 1 cystocele.  Mild hypermobility the bladder neck and negative cough test with a mild to moderate cough   Patient has mixed incontinence and high-volume bedwetting.  She soaks many pads a day.  She has frequency mild nocturia. She has neurogenic bladder risk factors likely not a neurogenic bladder  On urodynamics patient had not voided and was catheterized for 100 mL.  Maximum bladder capacity was 530 mL.  Bladder was stable.  Patient had no stress incontinence with a Valsalva pressure of 80 cm of water.  During voluntary voiding patient voided 9 mL.  Maximum voiding pressure 11 cm of water.  Flow rate less than 3 mils per second.  Residual 130 mL.    Based upon the urodynamics and presentation at least 80% of problem is overactive bladder.  She has mild stress incontinence.  Likely of third line OAB therapy would be in her best interest.  Her bedwetting and high-volume urge incontinence is the primary complaints   Will perform cystoscopy next visit.  She had yeast in the urine.  I gave her 3 days of Diflucan.  I did not want a false positive today  Today Urge incontinence 80% better.  Almost completely dry and very happy on Myrbetriq.  Clinically not infected.  Still had some yeast in the urine asymptomatic.   PMH: Past Medical History:  Diagnosis Date    Allergy    Depression    Diabetes mellitus without complication (HCC)    GERD (gastroesophageal reflux disease)    Hyperlipidemia    Hypertension    Neuropathy    OSA (obstructive sleep apnea)    Polycythemia     Surgical History: Past Surgical History:  Procedure Laterality Date   CERVICAL FUSION  12/07/2000   COLONOSCOPY  2006   Dr [name]   COLONOSCOPY WITH PROPOFOL N/A 07/30/2020   Procedure: COLONOSCOPY WITH PROPOFOL;  Surgeon: [name], [name], MD;  Location: [facility] ENDOSCOPY;  Service: Gastroenterology;  Laterality: N/A;  IDDM   CYSTOSTOMY  09/1984   SHOULDER SURGERY Right 11/2006   tumor removed   SUBDURAL HEMATOMA EVACUATION VIA CRANIOTOMY  04/16/2007   TONSILECTOMY, ADENOIDECTOMY, BILATERAL MYRINGOTOMY AND TUBES  03/2001   VAGINAL HYSTERECTOMY  02/23/2000    Home Medications:  Allergies as of 10/06/2020       Reactions   Ace Inhibitors Cough   Corticosteroids    Other reaction(s): OTHER   Nsaids    Other reaction(s): ANAPHYLAXIS        Medication List        Accurate as of October 06, 2020  1:08 PM. If you have any questions, ask your nurse or doctor.          Accu-Chek Aviva Plus test strip Generic drug: glucose blood USE ONE  STRIP TO CHECK GLUCOSE THREE TIMES DAILY AS  DIRECTED   amLODipine 10 MG tablet Commonly known as: NORVASC Take 1 tablet by mouth once daily   calcium-vitamin D 500-200 MG-UNIT tablet Commonly known as: OSCAL WITH D Take 1 tablet by mouth.   cetirizine 10 MG tablet Commonly known as: ZYRTEC Take 1 tablet by mouth once daily   citalopram 40 MG tablet Commonly known as: CELEXA Take 40 mg by mouth daily. Dr [name]   clopidogrel 75 MG tablet Commonly known as: PLAVIX Take 1 tablet by mouth once daily   Cyanocobalamin 1000 MCG Tbcr Take 1 tablet by mouth daily.   dicyclomine 10 MG capsule Commonly known as: BENTYL Take 10 mg by mouth 2 (two) times daily as needed.   empagliflozin 25 MG Tabs tablet Commonly known  as: JARDIANCE Take 1 tablet by mouth daily.   esomeprazole 40 MG capsule Commonly known as: NEXIUM Take 1 capsule by mouth once daily   fluconazole 100 MG tablet Commonly known as: DIFLUCAN Take 1 tablet (100 mg total) by mouth daily. X 7 days   gabapentin 300 MG capsule Commonly known as: NEURONTIN Take 1 capsule by mouth every 6 (six) hours as needed. endo   glipiZIDE 10 MG 24 hr tablet Commonly known as: GLUCOTROL XL Take 2.5 tablets by mouth daily. 48m daily/ [facility]   hydrochlorothiazide 12.5 MG capsule Commonly known as: MICROZIDE Take 1 capsule by mouth once daily   insulin aspart 100 UNIT/ML injection Commonly known as: novoLOG USE UP TO 60 UNITS PER DAY IN VGO PUMP AS DIRECTED   isosorbide mononitrate 30 MG 24 hr tablet Commonly known as: IMDUR Take 1 tablet by mouth daily.   liraglutide 18 MG/3ML Sopn Commonly known as: VICTOZA Inject 1.8 mg into the skin daily. [name]  losartan 25 MG tablet Commonly known as: COZAAR Take 1 tablet by mouth daily.   melatonin 5 MG Tabs Take 5 mg by mouth.   metFORMIN 1000 MG tablet Commonly known as: GLUCOPHAGE Take 1 tablet by mouth 2 (two) times daily. [name]  metFORMIN 500 MG tablet Commonly known as: GLUCOPHAGE Take 2 tablets by mouth 2 (two) times daily.   metoprolol succinate 50 MG 24 hr tablet Commonly known as: TOPROL-XL TAKE 1 TABLET BY MOUTH ONCE DAILY TAKE WITH OR IMMEDIATELY FOLLOWING A MEAL   mirabegron ER 50 MG Tb24 tablet Commonly known as: MYRBETRIQ Take 1 tablet (50 mg total) by mouth daily.   rosuvastatin 20 MG tablet Commonly known as: CRESTOR Take 1 tablet by mouth once daily   V-Go 40 Kit        Allergies:  Allergies  Allergen Reactions   Ace Inhibitors Cough   Corticosteroids     Other reaction(s): OTHER   Nsaids     Other reaction(s): ANAPHYLAXIS    Family History: Family History  Problem Relation Age of Onset   Leukemia Paternal Grandfather    Diabetes Mother     Leukemia Father    Goiter Sister     Social History:  reports that she has quit smoking. Her smoking use included cigarettes. She has a 0.75 pack-year smoking history. She has never used smokeless tobacco. She reports previous alcohol use. She reports that she does not use drugs.  ROS:  Physical Exam: There were no vitals taken for this visit.  Constitutional:  Alert and oriented, No acute distress. HEENT: [place] AT, moist mucus membranes.  Trachea midline, no masses.   Laboratory Data: Lab Results  Component Value Date   WBC 6.0 10/03/2017   HGB 13.6 10/03/2017   HCT 41.1 10/03/2017   MCV 93.4 10/03/2017   PLT 169 10/03/2017    Lab Results  Component Value Date   CREATININE 0.92 05/29/2019    No results found for: PSA  No results found for: TESTOSTERONE  Lab Results  Component Value Date   HGBA1C 9.8 04/08/2020    Urinalysis    Component Value Date/Time   APPEARANCEUR Hazy (A) 08/11/2020 1310   GLUCOSEU 2+ (A) 08/11/2020 1310   BILIRUBINUR Negative 08/11/2020 1310   PROTEINUR Negative 08/11/2020 1310   UROBILINOGEN 0.2 05/06/2020 1623   NITRITE Negative 08/11/2020 1310   LEUKOCYTESUR Negative 08/11/2020 1310    Pertinent Imaging:   Assessment & Plan: 3 days of Diflucan sent 1 last time for asymptomatic yeast in urine.  Partner is not symptomatic.  Reassess in 4 months on Myrbetriq 50 mg samples and prescription.  No need for cystoscopy at this stage  There are no diagnoses linked to this encounter.  No follow-ups on file.  Reece Packer, MD  [place] [address] [place], [place] [address] [phone]

## 2020-10-14 DIAGNOSIS — E669 Obesity, unspecified: Secondary | ICD-10-CM | POA: Diagnosis not present

## 2020-10-14 DIAGNOSIS — E1142 Type 2 diabetes mellitus with diabetic polyneuropathy: Secondary | ICD-10-CM | POA: Diagnosis not present

## 2020-10-14 DIAGNOSIS — Z794 Long term (current) use of insulin: Secondary | ICD-10-CM | POA: Diagnosis not present

## 2020-10-14 DIAGNOSIS — R509 Fever, unspecified: Secondary | ICD-10-CM | POA: Diagnosis not present

## 2020-10-14 DIAGNOSIS — Z03818 Encounter for observation for suspected exposure to other biological agents ruled out: Secondary | ICD-10-CM | POA: Diagnosis not present

## 2020-10-14 DIAGNOSIS — E559 Vitamin D deficiency, unspecified: Secondary | ICD-10-CM | POA: Diagnosis not present

## 2020-10-14 DIAGNOSIS — E785 Hyperlipidemia, unspecified: Secondary | ICD-10-CM | POA: Diagnosis not present

## 2020-10-14 DIAGNOSIS — E1165 Type 2 diabetes mellitus with hyperglycemia: Secondary | ICD-10-CM | POA: Diagnosis not present

## 2020-10-14 DIAGNOSIS — E1159 Type 2 diabetes mellitus with other circulatory complications: Secondary | ICD-10-CM | POA: Diagnosis not present

## 2020-10-14 LAB — HEMOGLOBIN A1C: Hemoglobin A1C: 9.7

## 2020-11-18 ENCOUNTER — Other Ambulatory Visit: Payer: Self-pay | Admitting: Family Medicine

## 2020-11-18 DIAGNOSIS — K219 Gastro-esophageal reflux disease without esophagitis: Secondary | ICD-10-CM

## 2020-11-20 ENCOUNTER — Other Ambulatory Visit: Payer: Self-pay | Admitting: Family Medicine

## 2020-12-01 ENCOUNTER — Telehealth: Payer: Self-pay

## 2020-12-01 ENCOUNTER — Other Ambulatory Visit: Payer: Self-pay

## 2020-12-01 NOTE — Telephone Encounter (Signed)
Spoke to [facility] at Dr Leonette Most office- [phone]. She is going to call Tina/ psychosocial in case she has a recurrent depression. Pt has been "under control with the '40mg'$  dosing of celexa."

## 2020-12-01 NOTE — Telephone Encounter (Signed)
Copied from [place] [phone]. Topic: General - Other >> Dec 01, 2020 10:14 AM Celene Kras wrote: Reason for CRM: Quita Skye, from Dr[facility], calling on behalf of pt to speak with Baxter Flattery. She states that the pt is having her last visit at their office today. She states that they have tried multiple times to contact [facility] behavioral health and they have not received a response. She states that the pt is in need of having her Celexa refilled due to Dr. [name] retiring at the beginning of November. She states that this will be patients last visit at 4:30 today and is requesting to have a response before then. Please advise .

## 2020-12-09 ENCOUNTER — Ambulatory Visit (INDEPENDENT_AMBULATORY_CARE_PROVIDER_SITE_OTHER): Payer: Medicare PPO

## 2020-12-09 DIAGNOSIS — Z23 Encounter for immunization: Secondary | ICD-10-CM

## 2020-12-13 DIAGNOSIS — G4733 Obstructive sleep apnea (adult) (pediatric): Secondary | ICD-10-CM | POA: Diagnosis not present

## 2020-12-15 ENCOUNTER — Other Ambulatory Visit: Payer: Self-pay

## 2020-12-15 ENCOUNTER — Ambulatory Visit: Payer: Medicare PPO | Admitting: Family Medicine

## 2020-12-15 ENCOUNTER — Ambulatory Visit
Admission: RE | Admit: 2020-12-15 | Discharge: 2020-12-15 | Disposition: A | Discharge status: Home / Self Care | Payer: Medicare PPO | Source: Ambulatory Visit | Attending: Family Medicine | Admitting: Family Medicine

## 2020-12-15 ENCOUNTER — Encounter: Payer: Self-pay | Admitting: Family Medicine

## 2020-12-15 ENCOUNTER — Ambulatory Visit
Admission: RE | Admit: 2020-12-15 | Discharge: 2020-12-15 | Disposition: A | Discharge status: Home / Self Care | Payer: Medicare PPO | Attending: Family Medicine | Admitting: Family Medicine

## 2020-12-15 VITALS — BP 138/80 | HR 64 | Ht 66.0 in | Wt 211.0 lb

## 2020-12-15 DIAGNOSIS — Z981 Arthrodesis status: Secondary | ICD-10-CM | POA: Diagnosis not present

## 2020-12-15 DIAGNOSIS — G8929 Other chronic pain: Secondary | ICD-10-CM | POA: Insufficient documentation

## 2020-12-15 DIAGNOSIS — M25511 Pain in right shoulder: Secondary | ICD-10-CM

## 2020-12-15 DIAGNOSIS — J929 Pleural plaque without asbestos: Secondary | ICD-10-CM | POA: Diagnosis not present

## 2020-12-15 DIAGNOSIS — M19011 Primary osteoarthritis, right shoulder: Secondary | ICD-10-CM | POA: Diagnosis not present

## 2020-12-15 DIAGNOSIS — M85811 Other specified disorders of bone density and structure, right shoulder: Secondary | ICD-10-CM | POA: Diagnosis not present

## 2020-12-15 MED ORDER — PREDNISONE 10 MG PO TABS: 10.0000 mg | ORAL_TABLET | Freq: Every day | ORAL | 0 refills | Status: DC

## 2020-12-15 NOTE — Progress Notes (Signed)
Date:  12/15/2020   Name:  Tracey Russo   DOB:  [birth year]   MRN:  [id number]   Chief Complaint: Shoulder Pain (Started about a year ago- has got worse. Can't reach up)  Shoulder Pain  The pain is present in the right shoulder. This is a new problem. The current episode started more than 1 year ago. There has been no history of extremity trauma. The problem occurs constantly. The problem has been gradually worsening. The quality of the pain is described as sharp. The pain is at a severity of 8/10. The pain is moderate. Associated symptoms include an inability to bear weight and a limited range of motion. Pertinent negatives include no fever, itching, joint locking, joint swelling, numbness, stiffness or tingling. The symptoms are aggravated by activity and lying down. She has tried NSAIDS for the symptoms. The treatment provided mild relief. Her past medical history is significant for diabetes.   Lab Results  Component Value Date   CREATININE 0.92 05/29/2019   BUN 13 05/29/2019   NA 137 05/29/2019   K 4.1 05/29/2019   CL 99 05/29/2019   CO2 22 05/29/2019   Lab Results  Component Value Date   CHOL 254 (A) 07/01/2020   HDL 39 07/01/2020   LDLCALC 181 07/01/2020   TRIG 166 (A) 07/01/2020   CHOLHDL 2.5 06/02/2017   No results found for: TSH Lab Results  Component Value Date   HGBA1C 9.7 10/14/2020   Lab Results  Component Value Date   WBC 6.0 10/03/2017   HGB 13.6 10/03/2017   HCT 41.1 10/03/2017   MCV 93.4 10/03/2017   PLT 169 10/03/2017   Lab Results  Component Value Date   ALT 16 05/29/2019   AST 12 05/29/2019   ALKPHOS 110 05/29/2019   BILITOT 0.4 05/29/2019     Review of Systems  Constitutional:  Negative for chills and fever.  HENT:  Negative for drooling, ear discharge, ear pain and sore throat.   Respiratory:  Negative for cough, shortness of breath and wheezing.   Cardiovascular:  Negative for chest pain, palpitations and leg swelling.   Gastrointestinal:  Negative for abdominal pain, blood in stool, constipation, diarrhea and nausea.  Endocrine: Negative for polydipsia.  Genitourinary:  Negative for dysuria, frequency, hematuria and urgency.  Musculoskeletal:  Negative for back pain, myalgias, neck pain and stiffness.  Skin:  Negative for itching and rash.  Allergic/Immunologic: Negative for environmental allergies.  Neurological:  Negative for dizziness, tingling, numbness and headaches.  Hematological:  Does not bruise/bleed easily.  Psychiatric/Behavioral:  Negative for suicidal ideas. The patient is not nervous/anxious.    Patient Active Problem List   Diagnosis Date Noted   LVH (left ventricular hypertrophy) due to hypertensive disease, without heart failure 08/15/2018   Obesity (BMI 30-39.9) 05/02/2018   Vitamin D deficiency 02/03/2018   Stable angina pectoris ([place]) 12/13/2017   Bilateral carotid artery stenosis 12/13/2017   Recurrent major depressive disorder ([place]) 10/17/2017   Mixed hyperlipidemia 12/22/2015   Acute seasonal allergic rhinitis 12/22/2015   Acute maxillary sinusitis 12/22/2015   Incomplete bladder emptying 02/05/2014   Dyslipidemia 08/09/2013   Gastroesophageal reflux disease 08/09/2013   Essential hypertension 08/09/2013   Diabetes mellitus, type 2 ([place]) 08/09/2013   Renal cyst, acquired, left 09/11/2012   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 09/11/2012   Urge incontinence 09/11/2012   Frank hematuria 05/08/2012   Mixed incontinence 64/15/8309   Renal colic 40/76/8088    Allergies  Allergen Reactions  Ace Inhibitors Cough   Corticosteroids     Other reaction(s): OTHER   Nsaids     Other reaction(s): ANAPHYLAXIS    Past Surgical History:  Procedure Laterality Date   CERVICAL FUSION  12/07/2000   COLONOSCOPY  2006   Dr [name]   COLONOSCOPY WITH PROPOFOL N/A 07/30/2020   Procedure: COLONOSCOPY WITH PROPOFOL;  Surgeon: [name], [name], MD;  Location: [facility] ENDOSCOPY;  Service:  Gastroenterology;  Laterality: N/A;  IDDM   CYSTOSTOMY  09/1984   SHOULDER SURGERY Right 11/2006   tumor removed   SUBDURAL HEMATOMA EVACUATION VIA CRANIOTOMY  04/16/2007   TONSILECTOMY, ADENOIDECTOMY, BILATERAL MYRINGOTOMY AND TUBES  03/2001   VAGINAL HYSTERECTOMY  02/23/2000    Social History   Tobacco Use   Smoking status: Former    Packs/day: 0.25    Years: 3.00    Pack years: 0.75    Types: Cigarettes   Smokeless tobacco: Never  Vaping Use   Vaping Use: Never used  Substance Use Topics   Alcohol use: Not Currently    Alcohol/week: 0.0 standard drinks   Drug use: No     Medication list has been reviewed and updated.  Current Meds  Medication Sig   amLODipine (NORVASC) 10 MG tablet Take 1 tablet by mouth once daily   calcium-vitamin D (OSCAL WITH D) 500-200 MG-UNIT tablet Take 1 tablet by mouth.   cetirizine (ZYRTEC) 10 MG tablet Take 1 tablet by mouth once daily   citalopram (CELEXA) 40 MG tablet Take 40 mg by mouth daily. Dr [name]   clopidogrel (PLAVIX) 75 MG tablet Take 1 tablet by mouth once daily   Cyanocobalamin 1000 MCG TBCR Take 1 tablet by mouth daily.   dicyclomine (BENTYL) 10 MG capsule Take 10 mg by mouth 2 (two) times daily as needed.   empagliflozin (JARDIANCE) 25 MG TABS tablet Take 1 tablet by mouth daily.   esomeprazole (NEXIUM) 40 MG capsule Take 1 capsule by mouth once daily   gabapentin (NEURONTIN) 300 MG capsule Take 1 capsule by mouth every 6 (six) hours as needed. endo   glipiZIDE (GLUCOTROL XL) 10 MG 24 hr tablet Take 2.5 tablets by mouth daily. 40m daily/ [facility]   glucose blood (ACCU-CHEK AVIVA PLUS) test strip USE ONE STRIP TO CHECK GLUCOSE THREE TIMES DAILY AS  DIRECTED   hydrochlorothiazide (MICROZIDE) 12.5 MG capsule Take 1 capsule by mouth once daily   insulin aspart (NOVOLOG) 100 UNIT/ML injection USE UP TO 60 UNITS PER DAY IN VGO PUMP AS DIRECTED   Insulin Disposable Pump (V-GO 40) KIT    isosorbide mononitrate (IMDUR) 30 MG 24 hr  tablet Take 1 tablet by mouth daily.   liraglutide (VICTOZA) 18 MG/3ML SOPN Inject 1.8 mg into the skin daily. [name]  melatonin 5 MG TABS Take 5 mg by mouth.   metFORMIN (GLUCOPHAGE) 1000 MG tablet Take 1 tablet by mouth 2 (two) times daily. [name]  metoprolol succinate (TOPROL-XL) 50 MG 24 hr tablet TAKE 1 TABLET BY MOUTH ONCE DAILY TAKE WITH OR IMMEDIATELY FOLLOWING A MEAL   mirabegron ER (MYRBETRIQ) 50 MG TB24 tablet Take 1 tablet (50 mg total) by mouth daily.   rosuvastatin (CRESTOR) 20 MG tablet Take 1 tablet by mouth once daily    PHQ 2/9 Scores 05/26/2020 05/06/2020 05/29/2019 05/23/2019  PHQ - 2 Score 2 3 0 0  PHQ- 9 Score 8 10 0 -    GAD 7 : Generalized Anxiety Score 05/06/2020 05/29/2019  Nervous, Anxious, on  Edge 1 0  Control/stop worrying 1 0  Worry too much - different things 2 0  Trouble relaxing 1 0  Restless 0 0  Easily annoyed or irritable 1 0  Afraid - awful might happen 0 0  Total GAD 7 Score 6 0  Anxiety Difficulty Not difficult at all -    BP Readings from Last 3 Encounters:  10/06/20 125/75  08/11/20 (!) 145/83  07/30/20 119/70    Physical Exam Vitals and nursing note reviewed.  Constitutional:      Appearance: She is well-developed.  HENT:     Head: Normocephalic.     Right Ear: Tympanic membrane, ear canal and external ear normal. There is no impacted cerumen.     Left Ear: Tympanic membrane, ear canal and external ear normal. There is no impacted cerumen.     Nose: Nose normal. No congestion or rhinorrhea.  Eyes:     General: Lids are everted, no foreign bodies appreciated. No scleral icterus.       Left eye: No foreign body or hordeolum.     Conjunctiva/sclera: Conjunctivae normal.     Right eye: Right conjunctiva is not injected.     Left eye: Left conjunctiva is not injected.     Pupils: Pupils are equal, round, and reactive to light.  Neck:     Thyroid: No thyromegaly.     Vascular: No JVD.     Trachea: No tracheal deviation.   Cardiovascular:     Rate and Rhythm: Normal rate and regular rhythm.     Heart sounds: Normal heart sounds. No murmur heard.   No friction rub. No gallop.  Pulmonary:     Effort: Pulmonary effort is normal. No respiratory distress.     Breath sounds: Normal breath sounds. No wheezing, rhonchi or rales.  Abdominal:     General: Bowel sounds are normal.     Palpations: Abdomen is soft. There is no mass.     Tenderness: There is no abdominal tenderness. There is no guarding or rebound.  Musculoskeletal:     Right shoulder: Tenderness present. Decreased range of motion.     Cervical back: Normal range of motion and neck supple.     Comments: Tender anterior/lateral/posterior  Lymphadenopathy:     Cervical: No cervical adenopathy.  Skin:    General: Skin is warm.     Findings: No rash.  Neurological:     Mental Status: She is alert and oriented to person, place, and time.     Cranial Nerves: No cranial nerve deficit.     Deep Tendon Reflexes: Reflexes normal.  Psychiatric:        Mood and Affect: Mood is not anxious or depressed.    Wt Readings from Last 3 Encounters:  12/15/20 211 lb (95.7 kg)  10/06/20 214 lb (97.1 kg)  08/11/20 214 lb (97.1 kg)    Ht '5\' 6"'  (1.676 m)   Wt 211 lb (95.7 kg)   BMI 34.06 kg/m   Assessment and Plan:  1. 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  For over a year.  Right shoulder pain without radiation without neck pain.  Almost no range of motion.  Tenderness in all aspects anterior lateral and posterior.  This is either a frozen shoulder with multiple areas involved, or rotator cuff tear, or impingement with biceps tendinitis suprascapular tendinitis.  Patient is unable to take NSAIDs but she does take Aleve.  Patient says that she cannot take prednisone but she has had  injections of steroids in the past.  I am electing to go diagnostic with x-rays of the shoulder and referral to Dr. Zigmund Daniel for evaluation and/or administration of medication if  unresolved on prednisone - DG Shoulder Right; Future - predniSONE (DELTASONE) 10 MG tablet; Take 1 tablet (10 mg total) by mouth daily with breakfast.  Dispense: 30 tablet; Refill: 0

## 2020-12-23 ENCOUNTER — Encounter: Payer: Self-pay | Admitting: Family Medicine

## 2020-12-23 ENCOUNTER — Inpatient Hospital Stay (INDEPENDENT_AMBULATORY_CARE_PROVIDER_SITE_OTHER): Payer: Medicare PPO | Admitting: Radiology

## 2020-12-23 ENCOUNTER — Ambulatory Visit: Payer: Medicare PPO | Admitting: Family Medicine

## 2020-12-23 ENCOUNTER — Other Ambulatory Visit: Payer: Self-pay

## 2020-12-23 VITALS — BP 118/64 | HR 75 | Ht 66.0 in | Wt 212.0 lb

## 2020-12-23 DIAGNOSIS — M12811 Other specific arthropathies, not elsewhere classified, right shoulder: Secondary | ICD-10-CM

## 2020-12-23 MED ORDER — TRIAMCINOLONE ACETONIDE 40 MG/ML IJ SUSP
80.0000 mg | Freq: Once | INTRAMUSCULAR | Status: AC
Start: 2020-12-23 — End: 2020-12-23
  Administered 2020-12-23: 80 mg

## 2020-12-23 NOTE — Patient Instructions (Signed)
You have just been given a cortisone injection to reduce pain and inflammation. After the injection you may notice immediate relief of pain as a result of the Lidocaine. It is important to rest the area of the injection for 24 to 48 hours after the injection. There is a possibility of some temporary increased discomfort and swelling for up to 72 hours until the cortisone begins to work. If you do have pain, simply rest the joint and use ice. If you can tolerate over the counter medications, you can try Tylenol for added relief per package instructions. - Dose prednisone for an additional 2 days - Afterwards, can transition to over-the-counter Tylenol on an as-needed basis - Can start home exercises if symptoms allow, focus on slow and steady advance - Return for follow-up in 3 weeks, contact us for questions between now and then

## 2020-12-23 NOTE — Progress Notes (Signed)
Primary Care / Sports Medicine Office Visit  Patient Information:  Patient ID: Tracey Russo, female DOB: 08-22-1950 Age: 70 y.o. MRN: [id number]   Tracey Russo is a pleasant 70 y.o. female presenting with the following:  Chief Complaint  Patient presents with   New Patient (Initial Visit)   Shoulder Pain    Right; x1 year; X-Ray 12/15/20; limited ROM, constant, throbbing, radiates down arm and numbness/tingling in hand and fingers; relieved some by prednisone and Tylenol; history cervical spine surgery 2002; 8/10 pain    Review of Systems pertinent details above   Patient Active Problem List   Diagnosis Date Noted   Rotator cuff arthropathy, right 12/23/2020   LVH (left ventricular hypertrophy) due to hypertensive disease, without heart failure 08/15/2018   Obesity (BMI 30-39.9) 05/02/2018   Vitamin D deficiency 02/03/2018   Stable angina pectoris ([place]) 12/13/2017   Bilateral carotid artery stenosis 12/13/2017   Recurrent major depressive disorder ([place]) 10/17/2017   Mixed hyperlipidemia 12/22/2015   Acute seasonal allergic rhinitis 12/22/2015   Acute maxillary sinusitis 12/22/2015   Incomplete bladder emptying 02/05/2014   Dyslipidemia 08/09/2013   Gastroesophageal reflux disease 08/09/2013   Essential hypertension 08/09/2013   Diabetes mellitus, type 2 ([place]) 08/09/2013   Renal cyst, acquired, left 09/11/2012   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 09/11/2012   Urge incontinence 09/11/2012   Pilar Plate hematuria 05/08/2012   Mixed incontinence 70/17/7939   Renal colic 03/00/9233   Past Medical History:  Diagnosis Date   Allergy    Depression    Diabetes mellitus without complication (HCC)    GERD (gastroesophageal reflux disease)    Hyperlipidemia    Hypertension    Neuropathy    OSA (obstructive sleep apnea)    Polycythemia    Outpatient Encounter Medications as of 12/23/2020  Medication Sig   amLODipine (NORVASC) 10 MG tablet Take 1 tablet by mouth once daily    calcium-vitamin D (OSCAL WITH D) 500-200 MG-UNIT tablet Take 1 tablet by mouth daily.   cetirizine (ZYRTEC) 10 MG tablet Take 1 tablet by mouth once daily   citalopram (CELEXA) 40 MG tablet Take 40 mg by mouth daily. Dr [name]   clopidogrel (PLAVIX) 75 MG tablet Take 1 tablet by mouth once daily   Cyanocobalamin 1000 MCG TBCR Take 1 tablet by mouth daily.   dicyclomine (BENTYL) 10 MG capsule Take 10 mg by mouth 2 (two) times daily as needed.   empagliflozin (JARDIANCE) 25 MG TABS tablet Take 1 tablet by mouth daily.   esomeprazole (NEXIUM) 40 MG capsule Take 1 capsule by mouth once daily   gabapentin (NEURONTIN) 300 MG capsule Take 1 capsule by mouth every 6 (six) hours as needed. endo   glipiZIDE (GLUCOTROL XL) 10 MG 24 hr tablet Take 25 mg by mouth daily.   glucose blood (ACCU-CHEK AVIVA PLUS) test strip USE ONE STRIP TO CHECK GLUCOSE THREE TIMES DAILY AS  DIRECTED   hydrochlorothiazide (MICROZIDE) 12.5 MG capsule Take 1 capsule by mouth once daily   insulin aspart (NOVOLOG) 100 UNIT/ML injection USE UP TO 60 UNITS PER DAY IN VGO PUMP AS DIRECTED   Insulin Disposable Pump (V-GO 40) KIT    isosorbide mononitrate (IMDUR) 30 MG 24 hr tablet Take 1 tablet by mouth daily.   liraglutide (VICTOZA) 18 MG/3ML SOPN Inject 1.8 mg into the skin daily.   losartan (COZAAR) 25 MG tablet Take 1 tablet by mouth daily.   melatonin 5 MG TABS Take 5 mg by mouth at  bedtime.   metFORMIN (GLUCOPHAGE) 1000 MG tablet Take 1 tablet by mouth 2 (two) times daily. Dr Eddie Dibbles   metoprolol succinate (TOPROL-XL) 50 MG 24 hr tablet TAKE 1 TABLET BY MOUTH ONCE DAILY TAKE WITH OR IMMEDIATELY FOLLOWING A MEAL   mirabegron ER (MYRBETRIQ) 50 MG TB24 tablet Take 1 tablet (50 mg total) by mouth daily.   predniSONE (DELTASONE) 10 MG tablet Take 1 tablet (10 mg total) by mouth daily with breakfast.   rosuvastatin (CRESTOR) 20 MG tablet Take 1 tablet by mouth once daily   [EXPIRED] triamcinolone acetonide (KENALOG-40) injection 80 mg     No facility-administered encounter medications on file as of 12/23/2020.   Past Surgical History:  Procedure Laterality Date   CERVICAL FUSION  12/07/2000   COLONOSCOPY  2006   Dr [name]   COLONOSCOPY WITH PROPOFOL N/A 07/30/2020   Procedure: COLONOSCOPY WITH PROPOFOL;  Surgeon: [name], [name], MD;  Location: [facility] ENDOSCOPY;  Service: Gastroenterology;  Laterality: N/A;  IDDM   CYSTOSTOMY  09/1984   SHOULDER SURGERY Right 11/2006   tumor removed   SUBDURAL HEMATOMA EVACUATION VIA CRANIOTOMY  04/16/2007   TONSILECTOMY, ADENOIDECTOMY, BILATERAL MYRINGOTOMY AND TUBES  03/2001   VAGINAL HYSTERECTOMY  02/23/2000    Vitals:   12/23/20 1356  BP: 118/64  Pulse: 75  SpO2: 94%   Vitals:   12/23/20 1356  Weight: 212 lb (96.2 kg)  Height: '5\' 6"'  (1.676 m)   Body mass index is 34.22 kg/m.  DG Shoulder Right  Result Date: 12/17/2020 CLINICAL DATA:  Shoulder pain and tenderness. EXAM: RIGHT SHOULDER - 2+ VIEW COMPARISON:  CT 11/16/2006. FINDINGS: Diffuse osteopenia. Acromioclavicular glenohumeral degenerative change. No evidence of fracture, dislocation, or separation. Degenerative changes cervicothoracic spine. Prior cervical fusion. Right-sided pleural thickening most consistent with scarring. IMPRESSION: Diffuse osteopenia. Acromioclavicular and glenohumeral degenerative change. No acute abnormality identified. Electronically Signed   By: Marcello Moores  Register M.D.   On: 12/17/2020 07:18     Independent interpretation of notes and tests performed by another provider:   Independent interpretation of right shoulder x-rays dated 12/15/2020 reveal significant inferior glenohumeral osteoarthritis with cortical spurring at the humeral head and inferior glenoid, there is cortical roughening at the greater tubercle, superior humeral head sclerosis, AC joint arthrosis, no acute osseous process identified  Procedures performed:   Procedure:  Injection of right subacromial space under ultrasound  guidance. Ultrasound guidance utilized for needle placement, confirmation of injectate Samsung HS60 device utilized with permanent recording / reporting. Consent obtained and verified. Skin prepped in a sterile fashion. Ethyl chloride spray for topical local analgesia.  Completed without difficulty and tolerated well. Medication: triamcinolone acetonide 40 mg/mL suspension for injection 2 mL total and 3.5 mL lidocaine 1% without epinephrine utilized for needle placement anesthetic Advised to contact for fevers/chills, erythema, induration, drainage, or persistent bleeding.   Pertinent History, Exam, Impression, and Recommendations:   Rotator cuff arthropathy, right Patient with greater than 1 year history of right posterolateral shoulder pain, atraumatic in onset, does describe baseline level of regarding activity.  Denies any sudden onset of symptoms, has been gradually progressive most notably over this past year but has been going on longer.  Does have a history of cervical spine fusion.  Pain is limited range of motion, primarily overhead, does note paresthesias into the right upper extremity to the fingertips, denies any overt neck pain.  Physical examination reveals significantly decreased range of motion with abduction, forward flexion, and external rotation.  She does have 5/5 strength with  external rotation on the right, internal rotation 5 -/5 strength, mildly painful, supraspinatus examination limited due to severity of symptoms but she is able to raise this against gravity alone.  Acutely positive impingement findings, attempted glenohumeral grind with minimal symptomatology, tenderness to the posterior lateral shoulder and at the subacromial space.  Spurling's elicits pain to the base of the right cervical spine, no paresthesias.  Given her clinical history and findings today, we did discuss diagnostic and therapeutic measures moving forward, she did elect to proceed with  ultrasound-guided subacromial corticosteroid injection.  Post care reviewed.  We will coordinate a follow-up in 3 weeks for close reassessment and possible additional injections pending findings at time to the glenohumeral articulation versus trigger points at the paracervical and peri-shoulder musculature.  Once appropriate, she would benefit from formal physical therapy.  I will write for home exercises that she can attempt if symptoms respond to an adequate degree to today's injection.    Orders & Medications Meds ordered this encounter  Medications   triamcinolone acetonide (KENALOG-40) injection 80 mg   Orders Placed This Encounter  Procedures   [place] LIMITED JOINT SPACE STRUCTURES UP RIGHT     Return in about 3 weeks (around 01/13/2021).     Montel Culver, MD   Primary Care Sports Medicine [facility]

## 2020-12-23 NOTE — Assessment & Plan Note (Addendum)
Patient with greater than 1 year history of right posterolateral shoulder pain, atraumatic in onset, does describe baseline level of regarding activity.  Denies any sudden onset of symptoms, has been gradually progressive most notably over this past year but has been going on longer.  Does have a history of cervical spine fusion.  Pain is limited range of motion, primarily overhead, does note paresthesias into the right upper extremity to the fingertips, denies any overt neck pain.  Physical examination reveals significantly decreased range of motion with abduction, forward flexion, and external rotation.  She does have 5/5 strength with external rotation on the right, internal rotation 5 -/5 strength, mildly painful, supraspinatus examination limited due to severity of symptoms but she is able to raise this against gravity alone.  Acutely positive impingement findings, attempted glenohumeral grind with minimal symptomatology, tenderness to the posterior lateral shoulder and at the subacromial space.  Spurling's elicits pain to the base of the right cervical spine, no paresthesias.  Given her clinical history and findings today, we did discuss diagnostic and therapeutic measures moving forward, she did elect to proceed with ultrasound-guided subacromial corticosteroid injection.  Post care reviewed.  We will coordinate a follow-up in 3 weeks for close reassessment and possible additional injections pending findings at time to the glenohumeral articulation versus trigger points at the paracervical and peri-shoulder musculature.  Once appropriate, she would benefit from formal physical therapy.  I will write for home exercises that she can attempt if symptoms respond to an adequate degree to today's injection.

## 2021-01-02 ENCOUNTER — Ambulatory Visit: Payer: Medicare PPO | Admitting: Family Medicine

## 2021-01-12 DIAGNOSIS — E1169 Type 2 diabetes mellitus with other specified complication: Secondary | ICD-10-CM | POA: Diagnosis not present

## 2021-01-12 DIAGNOSIS — E1159 Type 2 diabetes mellitus with other circulatory complications: Secondary | ICD-10-CM | POA: Diagnosis not present

## 2021-01-12 DIAGNOSIS — I152 Hypertension secondary to endocrine disorders: Secondary | ICD-10-CM | POA: Diagnosis not present

## 2021-01-12 DIAGNOSIS — E559 Vitamin D deficiency, unspecified: Secondary | ICD-10-CM | POA: Diagnosis not present

## 2021-01-12 DIAGNOSIS — Z794 Long term (current) use of insulin: Secondary | ICD-10-CM | POA: Diagnosis not present

## 2021-01-12 DIAGNOSIS — E1142 Type 2 diabetes mellitus with diabetic polyneuropathy: Secondary | ICD-10-CM | POA: Diagnosis not present

## 2021-01-12 DIAGNOSIS — E785 Hyperlipidemia, unspecified: Secondary | ICD-10-CM | POA: Diagnosis not present

## 2021-01-13 ENCOUNTER — Ambulatory Visit: Payer: Medicare PPO | Admitting: Family Medicine

## 2021-01-13 ENCOUNTER — Encounter: Payer: Self-pay | Admitting: Family Medicine

## 2021-01-13 ENCOUNTER — Other Ambulatory Visit: Payer: Self-pay

## 2021-01-13 VITALS — BP 118/82 | HR 72 | Ht 66.0 in | Wt 210.0 lb

## 2021-01-13 DIAGNOSIS — M12811 Other specific arthropathies, not elsewhere classified, right shoulder: Secondary | ICD-10-CM | POA: Diagnosis not present

## 2021-01-13 DIAGNOSIS — M19011 Primary osteoarthritis, right shoulder: Secondary | ICD-10-CM | POA: Diagnosis not present

## 2021-01-13 NOTE — Assessment & Plan Note (Signed)
Chronic issue that is now stable, patient states that she has noted significant improvement in her symptomatology, roughly 90%, following subacromial corticosteroid injection at the last visit.  She was able to be compliant with home-based exercises and has noted improvement in her activities of daily living, overall range of motion.  She does have mild posterior shoulder pain that does not significantly limit her ability to participate in home exercises.  Physical examination reveals painless range of motion with forward flexion to roughly 120 degrees, abduction to 90 degrees, can move an additional 10 degrees with abduction though with pain, external rotation and internal rotation appear near full and unrestricted, there is tenderness with deep palpation of the posterior joint line.  Patient is demonstrated excellent interval progress I did offer adjunct glenohumeral injection if she were to find that pain prevents her from performing home exercises.  We will hold from this at this time given her reassuring clinical picture.  We will coordinate a follow-up in 6 weeks time for reevaluation.  She was advised to contact us over the interim for any plateau in progress or worsening symptoms.

## 2021-01-13 NOTE — Progress Notes (Signed)
Primary Care / Sports Medicine Office Visit  Patient Information:  Patient ID: Tracey Russo, female DOB: [birth year] Age: 70 y.o. MRN: [id number]   Tracey Russo is a pleasant 70 y.o. female presenting with the following:  Chief Complaint  Patient presents with   Rotator cuff arthropathy, right    No pain in office today, only stiffness; doing home exercises and taking gabapentin with relief; cortisone injection 12/23/20 effective    Review of Systems pertinent details above   Patient Active Problem List   Diagnosis Date Noted   Glenohumeral arthritis, right 01/13/2021   Rotator cuff arthropathy, right 12/23/2020   LVH (left ventricular hypertrophy) due to hypertensive disease, without heart failure 08/15/2018   Obesity (BMI 30-39.9) 05/02/2018   Vitamin D deficiency 02/03/2018   Stable angina pectoris ([place]) 12/13/2017   Bilateral carotid artery stenosis 12/13/2017   Recurrent major depressive disorder ([place]) 10/17/2017   Mixed hyperlipidemia 12/22/2015   Acute seasonal allergic rhinitis 12/22/2015   Acute maxillary sinusitis 12/22/2015   Incomplete bladder emptying 02/05/2014   Dyslipidemia 08/09/2013   Gastroesophageal reflux disease 08/09/2013   Essential hypertension 08/09/2013   Diabetes mellitus, type 2 ([place]) 08/09/2013   Renal cyst, acquired, left 09/11/2012   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 09/11/2012   Urge incontinence 09/11/2012   Pilar Plate hematuria 05/08/2012   Mixed incontinence 75/64/3329   Renal colic 51/88/4166   Past Medical History:  Diagnosis Date   Allergy    Depression    Diabetes mellitus without complication (HCC)    GERD (gastroesophageal reflux disease)    Hyperlipidemia    Hypertension    Neuropathy    OSA (obstructive sleep apnea)    Polycythemia    Outpatient Encounter Medications as of 01/13/2021  Medication Sig   amLODipine (NORVASC) 10 MG tablet Take 1 tablet by mouth once daily   calcium-vitamin D (OSCAL WITH D) 500-200  MG-UNIT tablet Take 1 tablet by mouth daily.   cetirizine (ZYRTEC) 10 MG tablet Take 1 tablet by mouth once daily   citalopram (CELEXA) 40 MG tablet Take 40 mg by mouth daily. Dr [name]   clopidogrel (PLAVIX) 75 MG tablet Take 1 tablet by mouth once daily   Cyanocobalamin 1000 MCG TBCR Take 1 tablet by mouth daily.   dicyclomine (BENTYL) 10 MG capsule Take 10 mg by mouth 2 (two) times daily as needed.   empagliflozin (JARDIANCE) 25 MG TABS tablet Take 1 tablet by mouth daily.   esomeprazole (NEXIUM) 40 MG capsule Take 1 capsule by mouth once daily   gabapentin (NEURONTIN) 300 MG capsule Take 1 capsule by mouth every 6 (six) hours as needed. endo   glipiZIDE (GLUCOTROL XL) 10 MG 24 hr tablet Take 25 mg by mouth daily.   glucose blood (ACCU-CHEK AVIVA PLUS) test strip USE ONE STRIP TO CHECK GLUCOSE THREE TIMES DAILY AS  DIRECTED   hydrochlorothiazide (MICROZIDE) 12.5 MG capsule Take 1 capsule by mouth once daily   insulin aspart (NOVOLOG) 100 UNIT/ML injection USE UP TO 60 UNITS PER DAY IN VGO PUMP AS DIRECTED   Insulin Disposable Pump (V-GO 40) KIT    isosorbide mononitrate (IMDUR) 30 MG 24 hr tablet Take 1 tablet by mouth daily.   liraglutide (VICTOZA) 18 MG/3ML SOPN Inject 1.8 mg into the skin daily.   losartan (COZAAR) 25 MG tablet Take 1 tablet by mouth daily.   melatonin 5 MG TABS Take 5 mg by mouth at bedtime.   metFORMIN (GLUCOPHAGE) 1000 MG tablet Take 1  tablet by mouth 2 (two) times daily. Dr Eddie Dibbles   metoprolol succinate (TOPROL-XL) 50 MG 24 hr tablet TAKE 1 TABLET BY MOUTH ONCE DAILY TAKE WITH OR IMMEDIATELY FOLLOWING A MEAL   mirabegron ER (MYRBETRIQ) 50 MG TB24 tablet Take 1 tablet (50 mg total) by mouth daily.   rosuvastatin (CRESTOR) 20 MG tablet Take 1 tablet by mouth once daily   [DISCONTINUED] predniSONE (DELTASONE) 10 MG tablet Take 1 tablet (10 mg total) by mouth daily with breakfast.   No facility-administered encounter medications on file as of 01/13/2021.   Past Surgical  History:  Procedure Laterality Date   CERVICAL FUSION  12/07/2000   COLONOSCOPY  2006   Dr [name]   COLONOSCOPY WITH PROPOFOL N/A 07/30/2020   Procedure: COLONOSCOPY WITH PROPOFOL;  Surgeon: [name], [name], MD;  Location: [facility] ENDOSCOPY;  Service: Gastroenterology;  Laterality: N/A;  IDDM   CYSTOSTOMY  09/1984   SHOULDER SURGERY Right 11/2006   tumor removed   SUBDURAL HEMATOMA EVACUATION VIA CRANIOTOMY  04/16/2007   TONSILECTOMY, ADENOIDECTOMY, BILATERAL MYRINGOTOMY AND TUBES  03/2001   VAGINAL HYSTERECTOMY  02/23/2000    Vitals:   01/13/21 1347  BP: 118/82  Pulse: 72  SpO2: 96%   Vitals:   01/13/21 1347  Weight: 210 lb (95.3 kg)  Height: 5' 6" (1.676 m)   Body mass index is 33.89 kg/m.  DG Shoulder Right  Result Date: 12/17/2020 CLINICAL DATA:  Shoulder pain and tenderness. EXAM: RIGHT SHOULDER - 2+ VIEW COMPARISON:  CT 11/16/2006. FINDINGS: Diffuse osteopenia. Acromioclavicular glenohumeral degenerative change. No evidence of fracture, dislocation, or separation. Degenerative changes cervicothoracic spine. Prior cervical fusion. Right-sided pleural thickening most consistent with scarring. IMPRESSION: Diffuse osteopenia. Acromioclavicular and glenohumeral degenerative change. No acute abnormality identified. Electronically Signed   By: Marcello Moores  Register M.D.   On: 12/17/2020 07:18   [place] LIMITED JOINT SPACE STRUCTURES UP RIGHT  Result Date: 12/23/2020 Procedure:  Injection of right subacromial space under ultrasound guidance. Ultrasound guidance utilized for needle placement, confirmation of injectate Samsung HS60 device utilized with permanent recording / reporting. Consent obtained and verified. Skin prepped in a sterile fashion. Ethyl chloride spray for topical local analgesia. Completed without difficulty and tolerated well. Medication: triamcinolone acetonide 40 mg/mL suspension for injection 2 mL total and 3.5 mL lidocaine 1% without epinephrine utilized for needle  placement anesthetic Advised to contact for fevers/chills, erythema, induration, drainage, or persistent bleeding.    Independent interpretation of notes and tests performed by another provider:   None  Procedures performed:   None  Pertinent History, Exam, Impression, and Recommendations:   Rotator cuff arthropathy, right Chronic issue that is now stable, patient states that she has noted significant improvement in her symptomatology, roughly 90%, following subacromial corticosteroid injection at the last visit.  She was able to be compliant with home-based exercises and has noted improvement in her activities of daily living, overall range of motion.  She does have mild posterior shoulder pain that does not significantly limit her ability to participate in home exercises.  Physical examination reveals painless range of motion with forward flexion to roughly 120 degrees, abduction to 90 degrees, can move an additional 10 degrees with abduction though with pain, external rotation and internal rotation appear near full and unrestricted, there is tenderness with deep palpation of the posterior joint line.  Patient is demonstrated excellent interval progress I did offer adjunct glenohumeral injection if she were to find that pain prevents her from performing home exercises.  We will hold  from this at this time given her reassuring clinical picture.  We will coordinate a follow-up in 6 weeks time for reevaluation.  She was advised to contact us over the interim for any plateau in progress or worsening symptoms.  Glenohumeral arthritis, right See additional assessment(s) for plan details.   Orders & Medications No orders of the defined types were placed in this encounter.  No orders of the defined types were placed in this encounter.    Return in about 6 weeks (around 02/24/2021).     Montel Culver, MD   Primary Care Sports Medicine [place]

## 2021-01-13 NOTE — Assessment & Plan Note (Signed)
See additional assessment(s) for plan details. 

## 2021-01-13 NOTE — Patient Instructions (Signed)
-   Continue with gentle gradual progression with home exercises - Return for follow-up in 2-1/2 months - Contact us if any shoulder pain interferes with your progress or for any questions between now and follow-up

## 2021-02-09 ENCOUNTER — Other Ambulatory Visit: Payer: Self-pay

## 2021-02-09 ENCOUNTER — Encounter: Payer: Self-pay | Admitting: Urology

## 2021-02-09 ENCOUNTER — Ambulatory Visit: Payer: Medicare PPO | Admitting: Urology

## 2021-02-09 VITALS — BP 125/80 | HR 79 | Ht 66.0 in | Wt 210.0 lb

## 2021-02-09 DIAGNOSIS — N3946 Mixed incontinence: Secondary | ICD-10-CM | POA: Diagnosis not present

## 2021-02-09 MED ORDER — MIRABEGRON ER 50 MG PO TB24: 50.0000 mg | ORAL_TABLET | Freq: Every day | ORAL | 3 refills | Status: AC

## 2021-02-09 MED ORDER — MIRABEGRON ER 50 MG PO TB24: 50.0000 mg | ORAL_TABLET | Freq: Every day | ORAL | 3 refills | Status: DC

## 2021-02-09 NOTE — Addendum Note (Signed)
Addended by: Alvera Novel on: 02/09/2021 02:15 PM   Modules accepted: Orders

## 2021-02-09 NOTE — Progress Notes (Signed)
 02/09/2021 2:06 PM   Tracey Russo 05/25/1950 [id number]  Referring provider: Jones, Deanna C, MD [address] [place],  [place] [address]  Chief Complaint  Patient presents with   Urinary Incontinence    HPI: I was consulted to assess the patient urinary incontinence.  She leaks with coughing sneezing bending lifting.  She has urge incontinence.  She has high-volume bedwetting.  She can soak 5 or 6 pads a day.  Urgency component more severe   She voids every 2 hours and gets up once a night.  Her flow was good   She has had a hysterectomy.  She failed Myrbetriq.   She is an insulin-dependent diabetic.  She had a stroke.  She had neck surgery   Grade 1 cystocele.  Mild hypermobility the bladder neck and negative cough test with a mild to moderate cough   Patient has mixed incontinence and high-volume bedwetting.  She soaks many pads a day.  She has frequency mild nocturia. She has neurogenic bladder risk factors likely not a neurogenic bladder   On urodynamics patient had not voided and was catheterized for 100 mL.  Maximum bladder capacity was 530 mL.  Bladder was stable.  Patient had no stress incontinence with a Valsalva pressure of 80 cm of water.  During voluntary voiding patient voided 9 mL.  Maximum voiding pressure 11 cm of water.  Flow rate less than 3 mils per second.  Residual 130 mL.     Based upon the urodynamics and presentation at least 80% of problem is overactive bladder.  She has mild stress incontinence.  Likely of third line OAB therapy would be in her best interest.  Her bedwetting and high-volume urge incontinence is the primary complaints   Will perform cystoscopy next visit.  She had yeast in the urine.  I gave her 3 days of Diflucan.  I did not want a false positive today   Today Urge incontinence 80% better.  Almost completely dry and very happy on Myrbetriq.  Clinically not infected.  Still had some yeast in the urine asymptomatic.    3  days of Diflucan sent 1 last time for asymptomatic yeast in urine.  Partner is not symptomatic.  Reassess in 4 months on Myrbetriq 50 mg samples and prescription.  No need for cystoscopy at this stage  Today Urgency incontinence dramatically better.  Continent.  Frequency able.  No infections.   PMH: Past Medical History:  Diagnosis Date   Allergy    Depression    Diabetes mellitus without complication (HCC)    GERD (gastroesophageal reflux disease)    Hyperlipidemia    Hypertension    Neuropathy    OSA (obstructive sleep apnea)    Polycythemia     Surgical History: Past Surgical History:  Procedure Laterality Date   CERVICAL FUSION  12/07/2000   COLONOSCOPY  2006   Dr Elliott   COLONOSCOPY WITH PROPOFOL N/A 07/30/2020   Procedure: COLONOSCOPY WITH PROPOFOL;  Surgeon: [name], Teodoro K, MD;  Location: [facility] ENDOSCOPY;  Service: Gastroenterology;  Laterality: N/A;  IDDM   CYSTOSTOMY  09/1984   SHOULDER SURGERY Right 11/2006   tumor removed   SUBDURAL HEMATOMA EVACUATION VIA CRANIOTOMY  04/16/2007   TONSILECTOMY, ADENOIDECTOMY, BILATERAL MYRINGOTOMY AND TUBES  03/2001   VAGINAL HYSTERECTOMY  02/23/2000    Home Medications:  Allergies as of 02/09/2021       Reactions   Ace Inhibitors Cough   Corticosteroids    Other reaction(s):   OTHER   Nsaids    Other reaction(s): ANAPHYLAXIS        Medication List        Accurate as of February 09, 2021  2:06 PM. If you have any questions, ask your nurse or doctor.          STOP taking these medications    empagliflozin 25 MG Tabs tablet Commonly known as: JARDIANCE Stopped by: Scott A Macdiarmid, MD   glipiZIDE 10 MG 24 hr tablet Commonly known as: GLUCOTROL XL Stopped by: Scott A Macdiarmid, MD   metFORMIN 1000 MG tablet Commonly known as: GLUCOPHAGE Stopped by: Scott A Macdiarmid, MD       TAKE these medications    Accu-Chek Aviva Plus test strip Generic drug: glucose blood USE ONE STRIP TO CHECK GLUCOSE  THREE TIMES DAILY AS  DIRECTED   amLODipine 10 MG tablet Commonly known as: NORVASC Take 1 tablet by mouth once daily   calcium-vitamin D 500-200 MG-UNIT tablet Commonly known as: OSCAL WITH D Take 1 tablet by mouth daily.   cetirizine 10 MG tablet Commonly known as: ZYRTEC Take 1 tablet by mouth once daily   citalopram 40 MG tablet Commonly known as: CELEXA Take 40 mg by mouth daily. Dr Ryan   clopidogrel 75 MG tablet Commonly known as: PLAVIX Take 1 tablet by mouth once daily   Cyanocobalamin 1000 MCG Tbcr Take 1 tablet by mouth daily.   dicyclomine 10 MG capsule Commonly known as: BENTYL Take 10 mg by mouth 2 (two) times daily as needed.   esomeprazole 40 MG capsule Commonly known as: NEXIUM Take 1 capsule by mouth once daily   gabapentin 300 MG capsule Commonly known as: NEURONTIN Take 1 capsule by mouth every 6 (six) hours as needed. endo   hydrochlorothiazide 12.5 MG capsule Commonly known as: MICROZIDE Take 1 capsule by mouth once daily   insulin aspart 100 UNIT/ML injection Commonly known as: novoLOG USE UP TO 60 UNITS PER DAY IN VGO PUMP AS DIRECTED   isosorbide mononitrate 30 MG 24 hr tablet Commonly known as: IMDUR Take 1 tablet by mouth daily.   liraglutide 18 MG/3ML Sopn Commonly known as: VICTOZA Inject 1.8 mg into the skin daily.   losartan 25 MG tablet Commonly known as: COZAAR Take 1 tablet by mouth daily.   melatonin 5 MG Tabs Take 5 mg by mouth at bedtime.   metoprolol succinate 50 MG 24 hr tablet Commonly known as: TOPROL-XL TAKE 1 TABLET BY MOUTH ONCE DAILY TAKE WITH OR IMMEDIATELY FOLLOWING A MEAL   mirabegron ER 50 MG Tb24 tablet Commonly known as: MYRBETRIQ Take 1 tablet (50 mg total) by mouth daily.   rosuvastatin 20 MG tablet Commonly known as: CRESTOR Take 1 tablet by mouth once daily   V-Go 40 Kit        Allergies:  Allergies  Allergen Reactions   Ace Inhibitors Cough   Corticosteroids     Other  reaction(s): OTHER   Nsaids     Other reaction(s): ANAPHYLAXIS    Family History: Family History  Problem Relation Age of Onset   Leukemia Paternal Grandfather    Diabetes Mother    Leukemia Father    Goiter Sister     Social History:  reports that she has quit smoking. Her smoking use included cigarettes. She has a 0.75 pack-year smoking history. She has never used smokeless tobacco. She reports that she does not currently use alcohol. She reports that she does not use drugs.    ROS:                                        Physical Exam: BP 125/80   Pulse 79   Ht 5' 6" (1.676 m)   Wt 95.3 kg   BMI 33.89 kg/m   Constitutional:  Alert and oriented, No acute distress. HEENT: [place] AT, moist mucus membranes.  Trachea midline, no masses.   Laboratory Data: Lab Results  Component Value Date   WBC 6.0 10/03/2017   HGB 13.6 10/03/2017   HCT 41.1 10/03/2017   MCV 93.4 10/03/2017   PLT 169 10/03/2017    Lab Results  Component Value Date   CREATININE 0.92 05/29/2019    No results found for: PSA  No results found for: TESTOSTERONE  Lab Results  Component Value Date   HGBA1C 9.7 10/14/2020    Urinalysis    Component Value Date/Time   APPEARANCEUR Clear 10/06/2020 1311   GLUCOSEU 3+ (A) 10/06/2020 1311   BILIRUBINUR Negative 10/06/2020 1311   PROTEINUR 1+ (A) 10/06/2020 1311   UROBILINOGEN 0.2 05/06/2020 1623   NITRITE Negative 10/06/2020 1311   LEUKOCYTESUR Negative 10/06/2020 1311    Pertinent Imaging:   Assessment & Plan: 90x3 sent to pharmacy and I will see near  There are no diagnoses linked to this encounter.  No follow-ups on file.  Scott A Macdiarmid, MD  [facility] [address], [address] [place], [place] [address] [phone]   

## 2021-02-09 NOTE — Addendum Note (Signed)
Addended by: Alvera Novel on: 02/09/2021 02:38 PM   Modules accepted: Orders

## 2021-02-24 ENCOUNTER — Encounter: Payer: Self-pay | Admitting: Family Medicine

## 2021-02-24 ENCOUNTER — Other Ambulatory Visit: Payer: Self-pay

## 2021-02-24 ENCOUNTER — Ambulatory Visit (INDEPENDENT_AMBULATORY_CARE_PROVIDER_SITE_OTHER): Payer: Medicare PPO | Admitting: Family Medicine

## 2021-02-26 NOTE — Progress Notes (Signed)
Wrong physician

## 2021-02-27 DIAGNOSIS — E119 Type 2 diabetes mellitus without complications: Secondary | ICD-10-CM | POA: Diagnosis not present

## 2021-02-27 DIAGNOSIS — Z0100 Encounter for examination of eyes and vision without abnormal findings: Secondary | ICD-10-CM | POA: Diagnosis not present

## 2021-02-27 LAB — HM DIABETES EYE EXAM

## 2021-03-12 ENCOUNTER — Inpatient Hospital Stay (INDEPENDENT_AMBULATORY_CARE_PROVIDER_SITE_OTHER): Payer: Medicare PPO | Admitting: Radiology

## 2021-03-12 ENCOUNTER — Ambulatory Visit: Payer: Medicare PPO | Admitting: Family Medicine

## 2021-03-12 ENCOUNTER — Other Ambulatory Visit: Payer: Self-pay

## 2021-03-12 ENCOUNTER — Encounter: Payer: Self-pay | Admitting: Family Medicine

## 2021-03-12 VITALS — BP 108/78 | HR 74 | Ht 66.0 in | Wt 214.0 lb

## 2021-03-12 DIAGNOSIS — M12811 Other specific arthropathies, not elsewhere classified, right shoulder: Secondary | ICD-10-CM

## 2021-03-12 MED ORDER — TRAMADOL HCL 50 MG PO TABS: 50.0000 mg | ORAL_TABLET | Freq: Three times a day (TID) | ORAL | 0 refills | Status: AC | PRN

## 2021-03-12 MED ORDER — TRIAMCINOLONE ACETONIDE 40 MG/ML IJ SUSP
40.0000 mg | Freq: Once | INTRAMUSCULAR | Status: AC
  Administered 2021-03-12: 40 mg

## 2021-03-12 NOTE — Assessment & Plan Note (Signed)
Right-hand-dominant 70 year old patient presenting with recurrence of right anterolateral shoulder pain in the setting of known rotator cuff related arthropathy.  This is a chronic issue with exacerbation.  She reported excellent total symptom resolution following previous injection roughly 2 months prior.  She is requesting the same today.  Given her age, goal to maintain independence, risk first benefits reviewed and patient did elect to proceed with ultrasound-guided subacromial corticosteroid injection.  Given her high level of discomfort in the preceding days, I have advised adjunct as needed tramadol that she can use sparingly until symptoms respond to cortisone.  She can follow-up on an as-needed basis for this issue.

## 2021-03-12 NOTE — Progress Notes (Signed)
Primary Care / Sports Medicine Office Visit  Patient Information:  Patient ID: Tracey Russo, female DOB: February 25, 1951 Age: 70 y.o. MRN: [id number]   Tracey Russo is a pleasant 70 y.o. female presenting with the following:  Chief Complaint  Patient presents with   Shoulder Pain    Right shoulder      Patient Active Problem List   Diagnosis Date Noted   Glenohumeral arthritis, right 01/13/2021   Rotator cuff arthropathy, right 12/23/2020   LVH (left ventricular hypertrophy) due to hypertensive disease, without heart failure 08/15/2018   Obesity (BMI 30-39.9) 05/02/2018   Vitamin D deficiency 02/03/2018   Stable angina pectoris ([place]) 12/13/2017   Bilateral carotid artery stenosis 12/13/2017   Recurrent major depressive disorder ([place]) 10/17/2017   Mixed hyperlipidemia 12/22/2015   Acute seasonal allergic rhinitis 12/22/2015   Acute maxillary sinusitis 12/22/2015   Incomplete bladder emptying 02/05/2014   Dyslipidemia 08/09/2013   Gastroesophageal reflux disease 08/09/2013   Essential hypertension 08/09/2013   Diabetes mellitus, type 2 ([place]) 08/09/2013   Renal cyst, acquired, left 09/11/2012   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 09/11/2012   Urge incontinence 09/11/2012   Frank hematuria 05/08/2012   Mixed incontinence 15/17/6160   Renal colic 73/71/0626    Vitals:   03/12/21 1414  BP: 108/78  Pulse: 74  SpO2: 97%   Vitals:   03/12/21 1414  Weight: 214 lb (97.1 kg)  Height: 5\' 6"  (1.676 m)   Body mass index is 34.54 kg/m.  [place] LIMITED JOINT SPACE STRUCTURES UP RIGHT  Result Date: 03/12/2021 Procedure:  Injection of right subacromial shoulder under ultrasound guidance. Ultrasound guidance utilized for in plane approach to right subacromial space of the right shoulder, soft tissue shadow noted, deltoid and supraspinatus visualized, needle placement confirmed at the subdeltoid region, hypoechoic response from injectate noted Samsung HS60 device utilized with  permanent recording / reporting. Consent obtained and verified. Skin prepped in a sterile fashion. Ethyl chloride spray for topical local analgesia. Completed without difficulty and tolerated well. Medication: triamcinolone acetonide 40 mg/mL suspension for injection 1 mL total and 2 mL lidocaine 1% without epinephrine utilized for needle placement anesthetic Advised to contact for fevers/chills, erythema, induration, drainage, or persistent bleeding.    Independent interpretation of notes and tests performed by another provider:   None  Procedures performed:   Procedure:  Injection of right subacromial shoulder under ultrasound guidance. Ultrasound guidance utilized for in plane approach to right subacromial space of the right shoulder, soft tissue shadow noted, deltoid and supraspinatus visualized, needle placement confirmed at the subdeltoid region, hypoechoic response from injectate noted Samsung HS60 device utilized with permanent recording / reporting. Consent obtained and verified. Skin prepped in a sterile fashion. Ethyl chloride spray for topical local analgesia.  Completed without difficulty and tolerated well. Medication: triamcinolone acetonide 40 mg/mL suspension for injection 1 mL total and 2 mL lidocaine 1% without epinephrine utilized for needle placement anesthetic Advised to contact for fevers/chills, erythema, induration, drainage, or persistent bleeding.   Pertinent History, Exam, Impression, and Recommendations:   Rotator cuff arthropathy, right Right-hand-dominant 70 year old patient presenting with recurrence of right anterolateral shoulder pain in the setting of known rotator cuff related arthropathy.  This is a chronic issue with exacerbation.  She reported excellent total symptom resolution following previous injection roughly 2 months prior.  She is requesting the same today.  Given her age, goal to maintain independence, risk first benefits reviewed and patient did  elect to proceed with ultrasound-guided subacromial  corticosteroid injection.  Given her high level of discomfort in the preceding days, I have advised adjunct as needed tramadol that she can use sparingly until symptoms respond to cortisone.  She can follow-up on an as-needed basis for this issue.    Orders & Medications Meds ordered this encounter  Medications   traMADol (ULTRAM) 50 MG tablet    Sig: Take 1 tablet (50 mg total) by mouth every 8 (eight) hours as needed for up to 5 days.    Dispense:  10 tablet    Refill:  0   triamcinolone acetonide (KENALOG-40) injection 40 mg   Orders Placed This Encounter  Procedures   [place] LIMITED JOINT SPACE STRUCTURES UP RIGHT     Return if symptoms worsen or fail to improve.     Montel Culver, MD   Primary Care Sports Medicine [place]

## 2021-03-12 NOTE — Patient Instructions (Addendum)
You have just been given a cortisone injection to reduce pain and inflammation. After the injection you may notice immediate relief of pain as a result of the Lidocaine. It is important to rest the area of the injection for 24 to 48 hours after the injection. There is a possibility of some temporary increased discomfort and swelling for up to 72 hours until the cortisone begins to work. If you do have pain, simply rest the joint and use ice. If you can tolerate over the counter medications, you can try Tylenol, Aleve, or Advil for added relief per package instructions. - Can dose tramadol on an as-needed basis for additional pain control until you know response from cortisone - Contact us for any questions and follow-up as needed

## 2021-03-30 ENCOUNTER — Other Ambulatory Visit: Payer: Self-pay | Admitting: Family Medicine

## 2021-03-30 NOTE — Telephone Encounter (Signed)
Requested Prescriptions  Pending Prescriptions Disp Refills   cetirizine (ZYRTEC) 10 MG tablet [Pharmacy Med Name: Cetirizine HCl 10 MG Oral Tablet] 90 tablet 0    Sig: Take 1 tablet by mouth once daily     Ear, Nose, and Throat:  Antihistamines Passed - 03/30/2021 10:58 AM      Passed - Valid encounter within last 12 months    Recent Outpatient Visits          2 weeks ago Rotator cuff arthropathy, right   [facility] Montel Culver, MD   1 month ago Erroneous encounter - disregard   [facility] [name], MD   2 months ago Rotator cuff arthropathy, right   [facility] Montel Culver, MD   3 months ago Rotator cuff arthropathy, right   [facility] Montel Culver, MD   3 months ago Chronic pain in right shoulder   [facility] [name], MD      Future Appointments            In 108 months MacDiarmid, Nicki Reaper, [place]

## 2021-04-24 ENCOUNTER — Other Ambulatory Visit: Payer: Self-pay | Admitting: Family Medicine

## 2021-04-24 DIAGNOSIS — E785 Hyperlipidemia, unspecified: Secondary | ICD-10-CM

## 2021-04-24 NOTE — Telephone Encounter (Signed)
Requested medication (s) are due for refill today: yes  Requested medication (s) are on the active medication list: yes  Last refill:  12/26/19  Future visit scheduled: no  Notes to clinic:  Unable to refill per protocol due to failed labs, no updated results.      Requested Prescriptions  Pending Prescriptions Disp Refills   rosuvastatin (CRESTOR) 20 MG tablet [Pharmacy Med Name: Rosuvastatin Calcium 20 MG Oral Tablet] 90 tablet 0    Sig: Take 1 tablet by mouth once daily     Cardiovascular:  Antilipid - Statins 2 Failed - 04/24/2021  2:28 PM      Failed - Cr in normal range and within 360 days    Creatinine  Date Value Ref Range Status  05/15/2012 0.89 0.60 - 1.30 mg/dL Final   Creatinine, Ser  Date Value Ref Range Status  05/29/2019 0.92 0.57 - 1.00 mg/dL Final          Failed - Lipid Panel in normal range within the last 12 months    Cholesterol, Total  Date Value Ref Range Status  05/29/2019 265 (H) 100 - 199 mg/dL Final   Cholesterol  Date Value Ref Range Status  07/01/2020 254 (A) 0 - 200 Final   LDL Chol Calc (NIH)  Date Value Ref Range Status  05/29/2019 190 (H) 0 - 99 mg/dL Final   LDL Cholesterol  Date Value Ref Range Status  07/01/2020 181  Final   HDL  Date Value Ref Range Status  07/01/2020 39 35 - 70 Final  05/29/2019 38 (L) >39 mg/dL Final   Triglycerides  Date Value Ref Range Status  07/01/2020 166 (A) 40 - 160 Final         Passed - Patient is not pregnant      Passed - Valid encounter within last 12 months    Recent Outpatient Visits           1 month ago Rotator cuff arthropathy, right   [facility] Montel Culver, MD   1 month ago Erroneous encounter - disregard   [facility] [name], MD   3 months ago Rotator cuff arthropathy, right   [facility] Montel Culver, MD   4 months ago Rotator cuff arthropathy, right   [facility] Montel Culver, MD   4 months ago Chronic  pain in right shoulder   [facility] [name], MD       Future Appointments             In 9 months MacDiarmid, Nicki Reaper, [facility]

## 2021-04-27 NOTE — Telephone Encounter (Signed)
Pt called needing to add to her refills  Amlodipine 10 mg  Nexium 40 mg  Hydrochlorothizide 12.5  Losartin 25 mg  Metoprolol xl 50 mg  [facility] [place]   CB# [phone]

## 2021-04-27 NOTE — Telephone Encounter (Signed)
Pt called saying she needed to add to the prescriptions  A

## 2021-05-03 ENCOUNTER — Other Ambulatory Visit: Payer: Self-pay | Admitting: Family Medicine

## 2021-05-03 DIAGNOSIS — I10 Essential (primary) hypertension: Secondary | ICD-10-CM

## 2021-05-27 ENCOUNTER — Other Ambulatory Visit: Payer: Self-pay

## 2021-05-27 ENCOUNTER — Ambulatory Visit (INDEPENDENT_AMBULATORY_CARE_PROVIDER_SITE_OTHER): Payer: Medicare PPO

## 2021-05-27 VITALS — BP 112/72 | HR 75 | Temp 98.3°F | Resp 16 | Ht 66.0 in | Wt 215.2 lb

## 2021-05-27 DIAGNOSIS — Z0000 Encounter for general adult medical examination without abnormal findings: Secondary | ICD-10-CM | POA: Diagnosis not present

## 2021-05-27 DIAGNOSIS — Z780 Asymptomatic menopausal state: Secondary | ICD-10-CM

## 2021-05-27 DIAGNOSIS — Z1231 Encounter for screening mammogram for malignant neoplasm of breast: Secondary | ICD-10-CM | POA: Diagnosis not present

## 2021-05-27 NOTE — Progress Notes (Signed)
Subjective:   Tracey Russo is a 71 y.o. female who presents for Medicare Annual (Subsequent) preventive examination.  Review of Systems     Cardiac Risk Factors include: advanced age (>32mn, >>19women);diabetes mellitus;dyslipidemia;hypertension;obesity (BMI >30kg/m2)     Objective:    Today's Vitals   05/27/21 1515 05/27/21 1518  BP: 112/72   Pulse: 75   Resp: 16   Temp: 98.3 F (36.8 C)   TempSrc: Oral   SpO2: 96%   Weight: 215 lb 3.2 oz (97.6 kg)   Height: '5\' 6"'  (1.676 m)   PainSc:  4    Body mass index is 34.73 kg/m.  Advanced Directives 05/27/2021 07/30/2020 05/26/2020 10/17/2017 10/03/2017  Does Patient Have a Medical Advance Directive? No No No No No  Would patient like information on creating a medical advance directive? Yes (MAU/Ambulatory/Procedural Areas - Information given) - Yes (MAU/Ambulatory/Procedural Areas - Information given) No - Patient declined No - Patient declined    Current Medications (verified) Outpatient Encounter Medications as of 05/27/2021  Medication Sig   amLODipine (NORVASC) 10 MG tablet Take 1 tablet by mouth once daily   calcium-vitamin D (OSCAL WITH D) 500-200 MG-UNIT tablet Take 1 tablet by mouth daily.   cetirizine (ZYRTEC) 10 MG tablet Take 1 tablet by mouth once daily   citalopram (CELEXA) 20 MG tablet Take 2 tablets by mouth daily.   clopidogrel (PLAVIX) 75 MG tablet Take 1 tablet by mouth once daily   Cyanocobalamin 1000 MCG TBCR Take 1 tablet by mouth daily.   dicyclomine (BENTYL) 10 MG capsule Take 10 mg by mouth 2 (two) times daily as needed.   esomeprazole (NEXIUM) 40 MG capsule Take 1 capsule by mouth once daily   gabapentin (NEURONTIN) 300 MG capsule Take 1 capsule by mouth every 6 (six) hours as needed. endo   glucose blood (ACCU-CHEK AVIVA PLUS) test strip USE ONE STRIP TO CHECK GLUCOSE THREE TIMES DAILY AS  DIRECTED   hydrochlorothiazide (MICROZIDE) 12.5 MG capsule Take 1 capsule by mouth once daily   insulin aspart  (NOVOLOG) 100 UNIT/ML injection USE UP TO 60 UNITS PER DAY IN VGO PUMP AS DIRECTED   Insulin Disposable Pump (V-GO 40) KIT    isosorbide mononitrate (IMDUR) 30 MG 24 hr tablet Take 1 tablet by mouth daily.   liraglutide (VICTOZA) 18 MG/3ML SOPN Inject 1.8 mg into the skin daily.   melatonin 5 MG TABS Take 5 mg by mouth at bedtime.   metoprolol succinate (TOPROL-XL) 50 MG 24 hr tablet TAKE 1 TABLET BY MOUTH ONCE DAILY TAKE WITH OR IMMEDIATELY FOLLOWING A MEAL   mirabegron ER (MYRBETRIQ) 50 MG TB24 tablet Take 1 tablet (50 mg total) by mouth daily.   naproxen (NAPROSYN) 500 MG tablet Take 500 mg by mouth 2 (two) times daily with a meal.   OZEMPIC, 0.25 OR 0.5 MG/DOSE, 2 MG/1.5ML SOPN Inject into the skin. Weekly   polyethylene glycol powder (MIRALAX) 17 GM/SCOOP powder Take 1 Container by mouth once.   Probiotic Product (PROBIOTIC-10 PO) Take by mouth.   rosuvastatin (CRESTOR) 20 MG tablet Take 1 tablet by mouth once daily   SYNJARDY XR 12.07-998 MG TB24 Take 1 tablet by mouth daily.   TRESIBA FLEXTOUCH 100 UNIT/ML FlexTouch Pen Inject 20 Units into the skin daily.   Vitamin D, Ergocalciferol, (DRISDOL) 1.25 MG (50000 UNIT) CAPS capsule Take 1 capsule by mouth once a week.   losartan (COZAAR) 25 MG tablet Take 1 tablet by mouth daily.   [DISCONTINUED]  citalopram (CELEXA) 40 MG tablet Take 40 mg by mouth daily. Dr [name]   No facility-administered encounter medications on file as of 05/27/2021.    Allergies (verified) Ace inhibitors, Corticosteroids, and Nsaids   History: Past Medical History:  Diagnosis Date   Allergy    Depression    Diabetes mellitus without complication (HCC)    GERD (gastroesophageal reflux disease)    Hyperlipidemia    Hypertension    Neuropathy    OSA (obstructive sleep apnea)    Polycythemia    Past Surgical History:  Procedure Laterality Date   CERVICAL FUSION  12/07/2000   COLONOSCOPY  2006   Dr [name]   COLONOSCOPY WITH PROPOFOL N/A 07/30/2020    Procedure: COLONOSCOPY WITH PROPOFOL;  Surgeon: [name], [name], MD;  Location: [facility] ENDOSCOPY;  Service: Gastroenterology;  Laterality: N/A;  IDDM   CYSTOSTOMY  09/1984   SHOULDER SURGERY Right 11/2006   tumor removed   SUBDURAL HEMATOMA EVACUATION VIA CRANIOTOMY  04/16/2007   TONSILECTOMY, ADENOIDECTOMY, BILATERAL MYRINGOTOMY AND TUBES  03/2001   VAGINAL HYSTERECTOMY  02/23/2000   Family History  Problem Relation Age of Onset   Leukemia Paternal Grandfather    Diabetes Mother    Leukemia Father    Goiter Sister    Social History   Socioeconomic History   Marital status: Married    Spouse name: Carlean Crowl   Number of children: Not on file   Years of education: Not on file   Highest education level: Not on file  Occupational History    Comment: retired  Tobacco Use   Smoking status: Former    Packs/day: 0.25    Years: 3.00    Pack years: 0.75    Types: Cigarettes   Smokeless tobacco: Never  Vaping Use   Vaping Use: Never used  Substance and Sexual Activity   Alcohol use: Not Currently    Alcohol/week: 0.0 standard drinks   Drug use: Never   Sexual activity: Yes    Partners: Male  Other Topics Concern   Not on file  Social History Narrative   Not on file   Social Determinants of Health   Financial Resource Strain: Low Risk    Difficulty of Paying Living Expenses: Not hard at all  Food Insecurity: No Food Insecurity   Worried About Charity fundraiser in the Last Year: Never true   [place] in the Last Year: Never true  Transportation Needs: No Transportation Needs   Lack of Transportation (Medical): No   Lack of Transportation (Non-Medical): No  Physical Activity: Insufficiently Active   Days of Exercise per Week: 3 days   Minutes of Exercise per Session: 30 min  Stress: No Stress Concern Present   Feeling of Stress : Not at all  Social Connections: Moderately Isolated   Frequency of Communication with Friends and Family: More than three  times a week   Frequency of Social Gatherings with Friends and Family: Three times a week   Attends Religious Services: Never   Active Member of Clubs or Organizations: No   Attends Archivist Meetings: Never   Marital Status: Married    Tobacco Counseling Counseling given: Not Answered   Clinical Intake:  Pre-visit preparation completed: Yes  Pain : 0-10 Pain Score: 4  Pain Type: Acute pain Pain Location: Knee Pain Orientation: Left Pain Descriptors / Indicators: Aching, Sore Pain Onset: In the past 7 days Pain Frequency: Constant     BMI - recorded: 34.73  Nutritional Status: BMI > 30  Obese Nutritional Risks: Nausea/ vomitting/ diarrhea (diarrhea usually once a week) Diabetes: Yes CBG done?: No Did pt. bring in CBG monitor from home?: No  How often do you need to have someone help you when you read instructions, pamphlets, or other written materials from your doctor or pharmacy?: 1 - Never    Interpreter Needed?: No  Information entered by :: Clemetine Marker LPN   Activities of Daily Living In your present state of health, do you have any difficulty performing the following activities: 05/27/2021 03/12/2021  Hearing? N N  Vision? N Y  Difficulty concentrating or making decisions? N N  Walking or climbing stairs? N Y  Dressing or bathing? N N  Doing errands, shopping? N N  Preparing Food and eating ? N -  Using the Toilet? N -  In the past six months, have you accidently leaked urine? N -  Do you have problems with loss of bowel control? N -  Managing your Medications? N -  Managing your Finances? N -  Housekeeping or managing your Housekeeping? N -  Some recent data might be hidden    Patient Care Team: [name], MD as PCP - General (Family Medicine) Warnell Forester, NP (Inactive) as Nurse Practitioner (Endocrinology)  Indicate any recent Medical Services you may have received from other than [facility] providers in the past year (date may be  approximate).     Assessment:   This is a routine wellness examination for [place].  Hearing/Vision screen Hearing Screening - Comments:: Pt denies hearing difficulty Vision Screening - Comments:: Annual vision screenings done at [facility] and exercise activities discussed: Current Exercise Habits: Home exercise routine, Type of exercise: walking, Time (Minutes): 30, Frequency (Times/Week): 3, Weekly Exercise (Minutes/Week): 90, Intensity: Mild, Exercise limited by: orthopedic condition(s)   Goals Addressed             This Visit's Progress    Increase physical activity   On track    Pt would like to increase physical activity to at least 3 days per week       Depression Screen PHQ 2/9 Scores 05/27/2021 03/12/2021 01/13/2021 12/23/2020 05/26/2020 05/06/2020 05/29/2019  PHQ - 2 Score 0 0 0 0 2 3 0  PHQ- 9 Score - '5 2 2 8 10 ' 0    Fall Risk Fall Risk  05/27/2021 03/12/2021 01/13/2021 12/23/2020 05/26/2020  Falls in the past year? 0 0 0 0 0  Number falls in past yr: 0 0 0 0 0  Injury with Fall? 0 0 0 0 0  Risk for fall due to : No Fall Risks No Fall Risks Orthopedic patient Orthopedic patient Impaired balance/gait  Risk for fall due to: Comment - - - - -  Follow up Falls prevention discussed Falls evaluation completed Falls evaluation completed Falls evaluation completed Falls prevention discussed    FALL RISK PREVENTION PERTAINING TO THE HOME:  Any stairs in or around the home? No  If so, are there any without handrails? No  Home free of loose throw rugs in walkways, pet beds, electrical cords, etc? Yes  Adequate lighting in your home to reduce risk of falls? Yes   ASSISTIVE DEVICES UTILIZED TO PREVENT FALLS:  Life alert? No  Use of a cane, walker or w/c? No  Grab bars in the bathroom? Yes  Shower chair or bench in shower? Yes  Elevated toilet seat or a handicapped toilet? Yes   TIMED  UP AND GO:  Was the test performed? Yes .  Length of time to  ambulate 10 feet: 5 sec.   Gait steady and fast without use of assistive device  Cognitive Function:     6CIT Screen 05/23/2019  What Year? 0 points  What month? 0 points  What time? 0 points  Count back from 20 0 points  Months in reverse 0 points  Repeat phrase 0 points  Total Score 0    Immunizations Immunization History  Administered Date(s) Administered   Fluad Quad(high Dose 65+) 12/07/2018, 01/02/2020, 12/09/2020   Influenza, High Dose Seasonal PF 12/06/2017   Influenza,inj,Quad PF,6+ Mos 05/09/2015, 12/22/2015, 06/02/2017   PFIZER(Purple Top)SARS-COV-2 Vaccination 05/07/2019, 06/05/2019, 02/28/2020   Pneumococcal Conjugate-13 06/02/2017   Pneumococcal Polysaccharide-23 12/07/2018   Tdap 07/03/2013    TDAP status: Up to date  Flu Vaccine status: Up to date  Pneumococcal vaccine status: Up to date  Covid-19 vaccine status: Completed vaccines  Qualifies for Shingles Vaccine? Yes   Zostavax completed No   Shingrix Completed?: No.    Education has been provided regarding the importance of this vaccine. Patient has been advised to call insurance company to determine out of pocket expense if they have not yet received this vaccine. Advised may also receive vaccine at local pharmacy or Health Dept. Verbalized acceptance and understanding.  Screening Tests Health Maintenance  Topic Date Due   Zoster Vaccines- Shingrix (1 of 2) Never done   COVID-19 Vaccine (4 - Booster for Pfizer series) 04/24/2020   HEMOGLOBIN A1C  04/16/2021   URINE MICROALBUMIN  07/01/2021   MAMMOGRAM  06/26/2021 (Originally 01/02/2010)   DEXA SCAN  06/26/2021 (Originally 07/09/2013)   Hepatitis C Screening  06/26/2021 (Originally 07/10/1968)   FOOT EXAM  06/26/2021   OPHTHALMOLOGY EXAM  02/27/2022   TETANUS/TDAP  07/04/2023   COLONOSCOPY (Pts 45-43yr Insurance coverage will need to be confirmed)  07/31/2030   Pneumonia Vaccine 71 Years old  Completed   INFLUENZA VACCINE  Completed   HPV  [place]  Topic Date Due   Zoster Vaccines- Shingrix (1 of 2) Never done   COVID-19 Vaccine (4 - Booster for Pfizer series) 04/24/2020   HEMOGLOBIN A1C  04/16/2021   URINE MICROALBUMIN  07/01/2021    Colorectal cancer screening: No longer required.   Mammogram status: Ordered today. Pt provided with contact info and advised to call to schedule appt.   Bone Density status: Ordered today. Pt provided with contact info and advised to call to schedule appt.  Lung Cancer Screening: (Low Dose CT Chest recommended if Age 73-80years, 30 pack-year currently smoking OR have quit w/in 15years.) does not qualify.   Additional Screening:  Hepatitis C Screening: does qualify; postponed  Vision Screening: Recommended annual ophthalmology exams for early detection of glaucoma and other disorders of the eye. Is the patient up to date with their annual eye exam?  Yes  Who is the provider or what is the name of the office in which the patient attends annual eye exams? [place]: Recommended annual dental exams for proper oral hygiene  Community Resource Referral / Chronic Care Management: CRR required this visit?  No   CCM required this visit?  No      Plan:     I have personally reviewed and noted the following in the patients chart:   Medical and social history Use of alcohol, tobacco or  illicit drugs  Current medications and supplements including opioid prescriptions.  Functional ability and status Nutritional status Physical activity Advanced directives List of other physicians Hospitalizations, surgeries, and ER visits in previous 12 months Vitals Screenings to include cognitive, depression, and falls Referrals and appointments  In addition, I have reviewed and discussed with patient certain preventive protocols, quality metrics, and best practice recommendations. A written personalized  care plan for preventive services as well as general preventive health recommendations were provided to patient.     Clemetine Marker, LPN   03/28/3843   Nurse Notes: pt states she twisted her knee working in the yard a few days ago; plans to follow up if needed. Scheduled for follow up with endocrinology in April to check A1c

## 2021-05-27 NOTE — Patient Instructions (Signed)
Tracey Russo , Thank you for taking time to come for your Medicare Wellness Visit. I appreciate your ongoing commitment to your health goals. Please review the following plan we discussed and let me know if I can assist you in the future.   Screening recommendations/referrals: Colonoscopy: no longer required Mammogram: due. Please call [phone] to schedule your mammogram and bone density screening.  Bone Density: due Recommended yearly ophthalmology/optometry visit for glaucoma screening and checkup Recommended yearly dental visit for hygiene and checkup  Vaccinations: Influenza vaccine: done 12/09/20 Pneumococcal vaccine: done 12/07/18 Tdap vaccine: done 07/03/13 Shingles vaccine: Shingrix discussed. Please contact your pharmacy for coverage information.  Covid-19:done 05/07/19, 06/05/19 & 02/28/20  Advanced directives: Advance directive discussed with you today. I have provided a copy for you to complete at home and have notarized. Once this is complete please bring a copy in to our office so we can scan it into your chart.   Conditions/risks identified: Keep up the great work!  Next appointment: Follow up in one year for your annual wellness visit    Preventive Care 65 Years and Older, Female Preventive care refers to lifestyle choices and visits with your health care provider that can promote health and wellness. What does preventive care include? A yearly physical exam. This is also called an annual well check. Dental exams once or twice a year. Routine eye exams. Ask your health care provider how often you should have your eyes checked. Personal lifestyle choices, including: Daily care of your teeth and gums. Regular physical activity. Eating a healthy diet. Avoiding tobacco and drug use. Limiting alcohol use. Practicing safe sex. Taking low-dose aspirin every day. Taking vitamin and mineral supplements as recommended by your health care provider. What happens during an  annual well check? The services and screenings done by your health care provider during your annual well check will depend on your age, overall health, lifestyle risk factors, and family history of disease. Counseling  Your health care provider may ask you questions about your: Alcohol use. Tobacco use. Drug use. Emotional well-being. Home and relationship well-being. Sexual activity. Eating habits. History of falls. Memory and ability to understand (cognition). Work and work Statistician. Reproductive health. Screening  You may have the following tests or measurements: Height, weight, and BMI. Blood pressure. Lipid and cholesterol levels. These may be checked every 5 years, or more frequently if you are over 66 years old. Skin check. Lung cancer screening. You may have this screening every year starting at age 8 if you have a 30-pack-year history of smoking and currently smoke or have quit within the past 15 years. Fecal occult blood test (FOBT) of the stool. You may have this test every year starting at age 28. Flexible sigmoidoscopy or colonoscopy. You may have a sigmoidoscopy every 5 years or a colonoscopy every 10 years starting at age 63. Hepatitis C blood test. Hepatitis B blood test. Sexually transmitted disease (STD) testing. Diabetes screening. This is done by checking your blood sugar (glucose) after you have not eaten for a while (fasting). You may have this done every 1-3 years. Bone density scan. This is done to screen for osteoporosis. You may have this done starting at age 21. Mammogram. This may be done every 1-2 years. Talk to your health care provider about how often you should have regular mammograms. Talk with your health care provider about your test results, treatment options, and if necessary, the need for more tests. Vaccines  Your health care provider may recommend  certain vaccines, such as: Influenza vaccine. This is recommended every year. Tetanus,  diphtheria, and acellular pertussis (Tdap, Td) vaccine. You may need a Td booster every 10 years. Zoster vaccine. You may need this after age 59. Pneumococcal 13-valent conjugate (PCV13) vaccine. One dose is recommended after age 25. Pneumococcal polysaccharide (PPSV23) vaccine. One dose is recommended after age 82. Talk to your health care provider about which screenings and vaccines you need and how often you need them. This information is not intended to replace advice given to you by your health care provider. Make sure you discuss any questions you have with your health care provider. Document Released: 04/04/2015 Document Revised: 11/26/2015 Document Reviewed: 01/07/2015 Elsevier Interactive Patient Education  2017 [employer] Prevention in the Home Falls can cause injuries. They can happen to people of all ages. There are many things you can do to make your home safe and to help prevent falls. What can I do on the outside of my home? Regularly fix the edges of walkways and driveways and fix any cracks. Remove anything that might make you trip as you walk through a door, such as a raised step or threshold. Trim any bushes or trees on the path to your home. Use bright outdoor lighting. Clear any walking paths of anything that might make someone trip, such as rocks or tools. Regularly check to see if handrails are loose or broken. Make sure that both sides of any steps have handrails. Any raised decks and porches should have guardrails on the edges. Have any leaves, snow, or ice cleared regularly. Use sand or salt on walking paths during winter. Clean up any spills in your garage right away. This includes oil or grease spills. What can I do in the bathroom? Use night lights. Install grab bars by the toilet and in the tub and shower. Do not use towel bars as grab bars. Use non-skid mats or decals in the tub or shower. If you need to sit down in the shower, use a plastic,  non-slip stool. Keep the floor dry. Clean up any water that spills on the floor as soon as it happens. Remove soap buildup in the tub or shower regularly. Attach bath mats securely with double-sided non-slip rug tape. Do not have throw rugs and other things on the floor that can make you trip. What can I do in the bedroom? Use night lights. Make sure that you have a light by your bed that is easy to reach. Do not use any sheets or blankets that are too big for your bed. They should not hang down onto the floor. Have a firm chair that has side arms. You can use this for support while you get dressed. Do not have throw rugs and other things on the floor that can make you trip. What can I do in the kitchen? Clean up any spills right away. Avoid walking on wet floors. Keep items that you use a lot in easy-to-reach places. If you need to reach something above you, use a strong step stool that has a grab bar. Keep electrical cords out of the way. Do not use floor polish or wax that makes floors slippery. If you must use wax, use non-skid floor wax. Do not have throw rugs and other things on the floor that can make you trip. What can I do with my stairs? Do not leave any items on the stairs. Make sure that there are handrails on both sides of the  stairs and use them. Fix handrails that are broken or loose. Make sure that handrails are as long as the stairways. Check any carpeting to make sure that it is firmly attached to the stairs. Fix any carpet that is loose or worn. Avoid having throw rugs at the top or bottom of the stairs. If you do have throw rugs, attach them to the floor with carpet tape. Make sure that you have a light switch at the top of the stairs and the bottom of the stairs. If you do not have them, ask someone to add them for you. What else can I do to help prevent falls? Wear shoes that: Do not have high heels. Have rubber bottoms. Are comfortable and fit you well. Are closed  at the toe. Do not wear sandals. If you use a stepladder: Make sure that it is fully opened. Do not climb a closed stepladder. Make sure that both sides of the stepladder are locked into place. Ask someone to hold it for you, if possible. Clearly mark and make sure that you can see: Any grab bars or handrails. First and last steps. Where the edge of each step is. Use tools that help you move around (mobility aids) if they are needed. These include: Canes. Walkers. Scooters. Crutches. Turn on the lights when you go into a dark area. Replace any light bulbs as soon as they burn out. Set up your furniture so you have a clear path. Avoid moving your furniture around. If any of your floors are uneven, fix them. If there are any pets around you, be aware of where they are. Review your medicines with your doctor. Some medicines can make you feel dizzy. This can increase your chance of falling. Ask your doctor what other things that you can do to help prevent falls. This information is not intended to replace advice given to you by your health care provider. Make sure you discuss any questions you have with your health care provider. Document Released: 01/02/2009 Document Revised: 08/14/2015 Document Reviewed: 04/12/2014 Elsevier Interactive Patient Education  2017 [employer].

## 2021-06-03 ENCOUNTER — Telehealth: Payer: Self-pay

## 2021-06-03 NOTE — Telephone Encounter (Signed)
Called pt 07/21/21 @ 1:00 in [place] for mammo and bone density ?

## 2021-06-08 DIAGNOSIS — E1142 Type 2 diabetes mellitus with diabetic polyneuropathy: Secondary | ICD-10-CM | POA: Diagnosis not present

## 2021-06-08 DIAGNOSIS — I152 Hypertension secondary to endocrine disorders: Secondary | ICD-10-CM | POA: Diagnosis not present

## 2021-06-08 DIAGNOSIS — Z794 Long term (current) use of insulin: Secondary | ICD-10-CM | POA: Diagnosis not present

## 2021-06-08 DIAGNOSIS — E785 Hyperlipidemia, unspecified: Secondary | ICD-10-CM | POA: Diagnosis not present

## 2021-06-08 DIAGNOSIS — E1169 Type 2 diabetes mellitus with other specified complication: Secondary | ICD-10-CM | POA: Diagnosis not present

## 2021-06-08 DIAGNOSIS — E1159 Type 2 diabetes mellitus with other circulatory complications: Secondary | ICD-10-CM | POA: Diagnosis not present

## 2021-06-08 DIAGNOSIS — E559 Vitamin D deficiency, unspecified: Secondary | ICD-10-CM | POA: Diagnosis not present

## 2021-06-08 LAB — MICROALBUMIN / CREATININE URINE RATIO: Microalb Creat Ratio: 37.6

## 2021-06-12 ENCOUNTER — Ambulatory Visit: Payer: Medicare PPO | Admitting: Family Medicine

## 2021-06-15 DIAGNOSIS — E785 Hyperlipidemia, unspecified: Secondary | ICD-10-CM | POA: Diagnosis not present

## 2021-06-15 DIAGNOSIS — E1169 Type 2 diabetes mellitus with other specified complication: Secondary | ICD-10-CM | POA: Diagnosis not present

## 2021-06-15 DIAGNOSIS — I152 Hypertension secondary to endocrine disorders: Secondary | ICD-10-CM | POA: Diagnosis not present

## 2021-06-15 DIAGNOSIS — Z794 Long term (current) use of insulin: Secondary | ICD-10-CM | POA: Diagnosis not present

## 2021-06-15 DIAGNOSIS — E1142 Type 2 diabetes mellitus with diabetic polyneuropathy: Secondary | ICD-10-CM | POA: Diagnosis not present

## 2021-06-15 DIAGNOSIS — E1159 Type 2 diabetes mellitus with other circulatory complications: Secondary | ICD-10-CM | POA: Diagnosis not present

## 2021-06-24 DIAGNOSIS — E1142 Type 2 diabetes mellitus with diabetic polyneuropathy: Secondary | ICD-10-CM | POA: Diagnosis not present

## 2021-06-25 DIAGNOSIS — E1142 Type 2 diabetes mellitus with diabetic polyneuropathy: Secondary | ICD-10-CM | POA: Diagnosis not present

## 2021-07-01 DIAGNOSIS — H1013 Acute atopic conjunctivitis, bilateral: Secondary | ICD-10-CM | POA: Diagnosis not present

## 2021-07-21 ENCOUNTER — Inpatient Hospital Stay: Admission: RE | Admit: 2021-07-21 | Payer: Medicare PPO | Source: Ambulatory Visit

## 2021-07-21 ENCOUNTER — Other Ambulatory Visit: Payer: Medicare PPO

## 2021-07-24 DIAGNOSIS — E1142 Type 2 diabetes mellitus with diabetic polyneuropathy: Secondary | ICD-10-CM | POA: Diagnosis not present

## 2021-08-05 ENCOUNTER — Other Ambulatory Visit: Payer: Self-pay | Admitting: Family Medicine

## 2021-08-05 NOTE — Telephone Encounter (Signed)
Requested medications are due for refill today.  yes ? ?Requested medications are on the active medications list.  yes ? ?Last refill. 03/30/2021 #90 0 refills ? ?Future visit scheduled.   no ? ?Notes to clinic.  Medication refill failed protocol D/t expired labs. ? ? ? ?Requested Prescriptions  ?Pending Prescriptions Disp Refills  ? cetirizine (ZYRTEC) 10 MG tablet [Pharmacy Med Name: Cetirizine HCl 10 MG Oral Tablet] 90 tablet 0  ?  Sig: Take 1 tablet by mouth once daily  ?  ? Ear, Nose, and Throat:  Antihistamines 2 Failed - 08/05/2021 11:58 AM  ?  ?  Failed - Cr in normal range and within 360 days  ?  Creatinine  ?Date Value Ref Range Status  ?05/15/2012 0.89 0.60 - 1.30 mg/dL Final  ? ?Creatinine, Ser  ?Date Value Ref Range Status  ?05/29/2019 0.92 0.57 - 1.00 mg/dL Final  ?   ?  ?  Passed - Valid encounter within last 12 months  ?  Recent Outpatient Visits   ? ?      ? 4 months ago Rotator cuff arthropathy, right  ? [facility] Montel [place], MD  ? 5 months ago Erroneous encounter - disregard  ? [facility] [name], MD  ? 6 months ago Rotator cuff arthropathy, right  ? [place], [place]  ? 7 months ago Rotator cuff arthropathy, right  ? [facility] Montel [place], MD  ? 7 months ago Chronic pain in right shoulder  ? [facility] [name], MD  ? ?  ?  ?Future Appointments   ? ?        ? In 6 months MacDiarmid, Nicki Reaper, MD [place]  ? ?  ? ? ?  ?  ?  ?  ?

## 2021-08-24 DIAGNOSIS — E1142 Type 2 diabetes mellitus with diabetic polyneuropathy: Secondary | ICD-10-CM | POA: Diagnosis not present

## 2021-08-31 ENCOUNTER — Other Ambulatory Visit: Payer: Self-pay | Admitting: Family Medicine

## 2021-08-31 DIAGNOSIS — E785 Hyperlipidemia, unspecified: Secondary | ICD-10-CM

## 2021-09-01 ENCOUNTER — Telehealth: Payer: Self-pay | Admitting: Family Medicine

## 2021-09-01 ENCOUNTER — Other Ambulatory Visit: Payer: Self-pay | Admitting: Family Medicine

## 2021-09-01 NOTE — Telephone Encounter (Signed)
-----   Message from Fredderick Severance sent at 09/01/2021  7:53 AM EDT ----- Needs med refill with Ronnald Ramp

## 2021-09-01 NOTE — Telephone Encounter (Signed)
If patient calls back please let her know that we set up a refill medication appointment with Dr Ronnald Ramp 07/13.

## 2021-09-02 ENCOUNTER — Ambulatory Visit
Admission: RE | Admit: 2021-09-02 | Discharge: 2021-09-02 | Disposition: A | Discharge status: Home / Self Care | Payer: Medicare PPO | Source: Ambulatory Visit | Attending: Family Medicine | Admitting: Family Medicine

## 2021-09-02 DIAGNOSIS — Z1231 Encounter for screening mammogram for malignant neoplasm of breast: Secondary | ICD-10-CM | POA: Insufficient documentation

## 2021-09-02 DIAGNOSIS — Z780 Asymptomatic menopausal state: Secondary | ICD-10-CM | POA: Diagnosis not present

## 2021-09-02 DIAGNOSIS — M810 Age-related osteoporosis without current pathological fracture: Secondary | ICD-10-CM | POA: Diagnosis not present

## 2021-09-03 ENCOUNTER — Other Ambulatory Visit: Payer: Self-pay

## 2021-09-03 DIAGNOSIS — M810 Age-related osteoporosis without current pathological fracture: Secondary | ICD-10-CM

## 2021-09-03 MED ORDER — ALENDRONATE SODIUM 70 MG PO TABS: 70.0000 mg | ORAL_TABLET | ORAL | 5 refills | Status: DC

## 2021-09-19 ENCOUNTER — Other Ambulatory Visit: Payer: Self-pay | Admitting: Family Medicine

## 2021-09-19 DIAGNOSIS — K219 Gastro-esophageal reflux disease without esophagitis: Secondary | ICD-10-CM

## 2021-09-23 DIAGNOSIS — E1142 Type 2 diabetes mellitus with diabetic polyneuropathy: Secondary | ICD-10-CM | POA: Diagnosis not present

## 2021-09-27 ENCOUNTER — Other Ambulatory Visit: Payer: Self-pay | Admitting: Family Medicine

## 2021-09-27 DIAGNOSIS — I10 Essential (primary) hypertension: Secondary | ICD-10-CM

## 2021-10-01 ENCOUNTER — Ambulatory Visit: Payer: Medicare PPO | Admitting: Family Medicine

## 2021-10-13 ENCOUNTER — Ambulatory Visit: Payer: Medicare PPO | Admitting: Family Medicine

## 2021-10-14 ENCOUNTER — Other Ambulatory Visit: Payer: Self-pay | Admitting: Family Medicine

## 2021-10-19 DIAGNOSIS — I152 Hypertension secondary to endocrine disorders: Secondary | ICD-10-CM | POA: Diagnosis not present

## 2021-10-19 DIAGNOSIS — Z794 Long term (current) use of insulin: Secondary | ICD-10-CM | POA: Diagnosis not present

## 2021-10-19 DIAGNOSIS — E1159 Type 2 diabetes mellitus with other circulatory complications: Secondary | ICD-10-CM | POA: Diagnosis not present

## 2021-10-19 DIAGNOSIS — E1142 Type 2 diabetes mellitus with diabetic polyneuropathy: Secondary | ICD-10-CM | POA: Diagnosis not present

## 2021-10-19 DIAGNOSIS — E1169 Type 2 diabetes mellitus with other specified complication: Secondary | ICD-10-CM | POA: Diagnosis not present

## 2021-10-19 DIAGNOSIS — E785 Hyperlipidemia, unspecified: Secondary | ICD-10-CM | POA: Diagnosis not present

## 2021-10-24 DIAGNOSIS — E1142 Type 2 diabetes mellitus with diabetic polyneuropathy: Secondary | ICD-10-CM | POA: Diagnosis not present

## 2021-11-24 DIAGNOSIS — E1142 Type 2 diabetes mellitus with diabetic polyneuropathy: Secondary | ICD-10-CM | POA: Diagnosis not present

## 2021-12-24 DIAGNOSIS — E1142 Type 2 diabetes mellitus with diabetic polyneuropathy: Secondary | ICD-10-CM | POA: Diagnosis not present

## 2022-01-24 DIAGNOSIS — E1142 Type 2 diabetes mellitus with diabetic polyneuropathy: Secondary | ICD-10-CM | POA: Diagnosis not present

## 2022-02-15 ENCOUNTER — Encounter: Payer: Self-pay | Admitting: Urology

## 2022-02-15 ENCOUNTER — Ambulatory Visit: Payer: Medicare PPO | Admitting: Urology

## 2022-02-22 DIAGNOSIS — Z794 Long term (current) use of insulin: Secondary | ICD-10-CM | POA: Diagnosis not present

## 2022-02-22 DIAGNOSIS — E1142 Type 2 diabetes mellitus with diabetic polyneuropathy: Secondary | ICD-10-CM | POA: Diagnosis not present

## 2022-02-22 DIAGNOSIS — E785 Hyperlipidemia, unspecified: Secondary | ICD-10-CM | POA: Diagnosis not present

## 2022-02-22 DIAGNOSIS — E1159 Type 2 diabetes mellitus with other circulatory complications: Secondary | ICD-10-CM | POA: Diagnosis not present

## 2022-02-22 DIAGNOSIS — I152 Hypertension secondary to endocrine disorders: Secondary | ICD-10-CM | POA: Diagnosis not present

## 2022-02-22 DIAGNOSIS — E1169 Type 2 diabetes mellitus with other specified complication: Secondary | ICD-10-CM | POA: Diagnosis not present

## 2022-02-22 DIAGNOSIS — E559 Vitamin D deficiency, unspecified: Secondary | ICD-10-CM | POA: Diagnosis not present

## 2022-02-22 LAB — HM DIABETES FOOT EXAM: HM Diabetic Foot Exam: NORMAL

## 2022-02-22 LAB — HEMOGLOBIN A1C: Hemoglobin A1C: 7.5

## 2022-02-23 DIAGNOSIS — E1142 Type 2 diabetes mellitus with diabetic polyneuropathy: Secondary | ICD-10-CM | POA: Diagnosis not present

## 2022-02-25 ENCOUNTER — Other Ambulatory Visit: Payer: Self-pay | Admitting: Family Medicine

## 2022-02-25 DIAGNOSIS — I10 Essential (primary) hypertension: Secondary | ICD-10-CM

## 2022-02-25 NOTE — Telephone Encounter (Signed)
Called pt and made appt for tomorrow.

## 2022-02-25 NOTE — Telephone Encounter (Signed)
Requested medications are due for refill today.  yes  Requested medications are on the active medications list.  yes  Last refill. 09/2021 for all 4   Future visit scheduled.   Yes Tomorrow  Notes to clinic.  Overdue for OV. Labs are expired.    Requested Prescriptions  Pending Prescriptions Disp Refills   cetirizine (ZYRTEC) 10 MG tablet [Pharmacy Med Name: Cetirizine HCl 10 MG Oral Tablet] 30 tablet 0    Sig: Take 1 tablet by mouth once daily     Ear, Nose, and Throat:  Antihistamines 2 Failed - 02/25/2022  1:56 PM      Failed - Cr in normal range and within 360 days    Creatinine  Date Value Ref Range Status  05/15/2012 0.89 0.60 - 1.30 mg/dL Final   Creatinine, Ser  Date Value Ref Range Status  05/29/2019 0.92 0.57 - 1.00 mg/dL Final         Passed - Valid encounter within last 12 months    Recent Outpatient Visits           11 months ago Rotator cuff arthropathy, right   [place] [facility] [place], Earley [place], MD   1 year ago Erroneous encounter - disregard   [place] [facility] at [place], Deanna C, MD   1 year ago Rotator cuff arthropathy, right   [place] [facility] at [facility], Earley [place]ide, MD   1 year ago Rotator cuff arthropathy, right   [place] [facility] at [facility], Earley Abide, MD   1 year ago Chronic pain in right shoulder   [facility] at [place], [place], MD       Future Appointments             Tomorrow [name], MD [facility] at [place], [place]             hydrochlorothiazide (MICROZIDE) 12.5 MG capsule [Pharmacy Med Name: hydroCHLOROthiazide 12.5 MG Oral Capsule] 30 capsule 0    Sig: Take 1 capsule by mouth once daily     Cardiovascular: Diuretics - Thiazide Failed - 02/25/2022  1:56 PM       Failed - Cr in normal range and within 180 days    Creatinine  Date Value Ref Range Status  05/15/2012 0.89 0.60 - 1.30 mg/dL Final   Creatinine, Ser  Date Value Ref Range Status  05/29/2019 0.92 0.57 - 1.00 mg/dL Final         Failed - K in normal range and within 180 days    Potassium  Date Value Ref Range Status  05/29/2019 4.1 3.5 - 5.2 mmol/L Final         Failed - Na in normal range and within 180 days    Sodium  Date Value Ref Range Status  05/29/2019 137 134 - 144 mmol/L Final         Failed - Valid encounter within last 6 months    Recent Outpatient Visits           11 months ago Rotator cuff arthropathy, right    [facility] at [place], Earley Abide, MD   1 year ago Erroneous encounter - disregard   [facility] at [facility]  [name], MD   1 year ago Rotator cuff arthropathy, right   [place] [facility] at [place], Earley Abide, MD   1 year ago Rotator cuff arthropathy, right   [place] [facility] at [place], Earley Abide, MD   1 year ago Chronic pain in right shoulder   [place] [facility] at [place], [place], MD       Future Appointments             Tomorrow [name], MD [facility] and [facility] Medicine at [facility], [place] BP in normal range    BP Readings from Last 1 Encounters:  05/27/21 112/72          amLODipine (NORVASC) 10 MG tablet [Pharmacy Med Name: amLODIPine Besylate 10 MG Oral Tablet] 30 tablet 0    Sig: Take 1 tablet by mouth once daily     Cardiovascular: Calcium Channel Blockers 2 Failed - 02/25/2022  1:56 PM      Failed - Valid encounter within last 6 months    Recent Outpatient Visits           11 months ago Rotator cuff arthropathy, right   [place] [facility] at [place], Earley Abide, MD   1 year ago Erroneous encounter - disregard   [facility] at [place], Deanna C, MD   1 year ago Rotator cuff arthropathy, right   [place] and [facility] at [place], Earley Abide, MD   1 year ago Rotator cuff arthropathy, right   [place] [facility] [place], Earley Abide, MD   1 year ago Chronic pain in right shoulder   [place] [facility] at [place], [place], MD       Future Appointments             Tomorrow [name], MD [facility] at [facility], [place] BP in normal range    BP Readings from Last 1 Encounters:  05/27/21 112/72         Passed - Last Heart Rate in normal range    Pulse Readings from Last 1 Encounters:  05/27/21 75          metoprolol succinate (TOPROL-XL) 50 MG 24 hr tablet [Pharmacy Med Name: Metoprolol Succinate ER 50 MG Oral Tablet Extended Release 24 Hour] 30 tablet 0    Sig: TAKE 1 TABLET BY MOUTH ONCE DAILY - TAKE WITH OR IMMEDIATLY FOLLOWING A MEAL     Cardiovascular:  Beta Blockers Failed - 02/25/2022  1:56 PM      Failed - Valid encounter within last 6 months    Recent Outpatient Visits           11 months ago Rotator cuff arthropathy, right   [place] [facility] at [place], Earley Abide, MD   1 year ago Erroneous encounter - disregard   [facility] and [facility] at [place], Deanna C, MD   1 year ago Rotator cuff arthropathy, right  [place] [facility] at [facility], Earley Abide, MD   1 year ago Rotator cuff arthropathy, right   [facility] and [facility] Medicine at [place], Earley Abide, MD   1 year ago Chronic pain in  right shoulder   [place] [facility] at [place], [place], MD       Future Appointments             Tomorrow [name], MD [facility] Primary Care and [facility] Medicine at [facility], [place] BP in normal range    BP Readings from Last 1 Encounters:  05/27/21 112/72         Passed - Last Heart Rate in normal range    Pulse Readings from Last 1 Encounters:  05/27/21 75

## 2022-02-26 ENCOUNTER — Ambulatory Visit: Payer: Medicare PPO | Admitting: Family Medicine

## 2022-02-26 ENCOUNTER — Encounter: Payer: Self-pay | Admitting: Family Medicine

## 2022-02-26 VITALS — BP 124/78 | HR 74 | Ht 66.0 in | Wt 216.0 lb

## 2022-02-26 DIAGNOSIS — I679 Cerebrovascular disease, unspecified: Secondary | ICD-10-CM

## 2022-02-26 DIAGNOSIS — Z23 Encounter for immunization: Secondary | ICD-10-CM | POA: Diagnosis not present

## 2022-02-26 DIAGNOSIS — E785 Hyperlipidemia, unspecified: Secondary | ICD-10-CM

## 2022-02-26 DIAGNOSIS — K219 Gastro-esophageal reflux disease without esophagitis: Secondary | ICD-10-CM

## 2022-02-26 DIAGNOSIS — F339 Major depressive disorder, recurrent, unspecified: Secondary | ICD-10-CM | POA: Diagnosis not present

## 2022-02-26 DIAGNOSIS — I10 Essential (primary) hypertension: Secondary | ICD-10-CM | POA: Diagnosis not present

## 2022-02-26 DIAGNOSIS — M810 Age-related osteoporosis without current pathological fracture: Secondary | ICD-10-CM

## 2022-02-26 DIAGNOSIS — J301 Allergic rhinitis due to pollen: Secondary | ICD-10-CM

## 2022-02-26 MED ORDER — CETIRIZINE HCL 10 MG PO TABS: 10.0000 mg | ORAL_TABLET | Freq: Every day | ORAL | 1 refills | Status: DC

## 2022-02-26 MED ORDER — ESOMEPRAZOLE MAGNESIUM 40 MG PO CPDR: 40.0000 mg | DELAYED_RELEASE_CAPSULE | Freq: Every day | ORAL | 1 refills | Status: DC

## 2022-02-26 MED ORDER — HYDROCHLOROTHIAZIDE 12.5 MG PO CAPS: 12.5000 mg | ORAL_CAPSULE | Freq: Every day | ORAL | 1 refills | Status: DC

## 2022-02-26 MED ORDER — ROSUVASTATIN CALCIUM 20 MG PO TABS: 20.0000 mg | ORAL_TABLET | Freq: Every day | ORAL | 1 refills | Status: DC

## 2022-02-26 MED ORDER — METOPROLOL SUCCINATE ER 50 MG PO TB24: ORAL_TABLET | ORAL | 1 refills | Status: DC

## 2022-02-26 MED ORDER — ALENDRONATE SODIUM 70 MG PO TABS: 70.0000 mg | ORAL_TABLET | ORAL | 1 refills | Status: DC

## 2022-02-26 MED ORDER — CITALOPRAM HYDROBROMIDE 20 MG PO TABS: 40.0000 mg | ORAL_TABLET | Freq: Every day | ORAL | 1 refills | Status: DC

## 2022-02-26 MED ORDER — CLOPIDOGREL BISULFATE 75 MG PO TABS: 75.0000 mg | ORAL_TABLET | Freq: Every day | ORAL | 1 refills | Status: DC

## 2022-02-26 MED ORDER — AMLODIPINE BESYLATE 10 MG PO TABS: 10.0000 mg | ORAL_TABLET | Freq: Every day | ORAL | 1 refills | Status: DC

## 2022-02-26 NOTE — Progress Notes (Signed)
Date:  02/26/2022   Name:  Tracey Russo   DOB:  [birth year]   MRN:  [id number]   Chief Complaint: Flu Vaccine, Covid Vaccine , Hyperlipidemia, Hypertension, Gastroesophageal Reflux, Allergic Rhinitis , Osteoporosis, and Depression  Hyperlipidemia This is a chronic problem. The current episode started more than 1 year ago. The problem is controlled. Recent lipid tests were reviewed and are normal. She has no history of chronic renal disease, diabetes, hypothyroidism, liver disease, obesity or nephrotic syndrome. There are no known factors aggravating her hyperlipidemia. Pertinent negatives include no chest pain, focal sensory loss, focal weakness, leg pain, myalgias or shortness of breath. Current antihyperlipidemic treatment includes statins. The current treatment provides moderate improvement of lipids. There are no compliance problems.  Risk factors for coronary artery disease include dyslipidemia and hypertension.  Hypertension This is a chronic problem. The problem has been gradually improving since onset. The problem is controlled. Pertinent negatives include no chest pain, orthopnea, palpitations, PND or shortness of breath. Past treatments include beta blockers, calcium channel blockers and diuretics. The current treatment provides moderate improvement. There are no compliance problems.  There is no history of angina, kidney disease, CAD/MI, CVA, heart failure, left ventricular hypertrophy, PVD or retinopathy. There is no history of chronic renal disease, a hypertension causing med or renovascular disease.  Gastroesophageal Reflux She reports no abdominal pain, no belching, no chest pain, no coughing, no dysphagia, no early satiety, no globus sensation, no heartburn, no nausea, no sore throat, no stridor or no wheezing. This is a chronic problem. The current episode started more than 1 year ago. The problem has been waxing and waning. Pertinent negatives include no fatigue. She has tried a  PPI for the symptoms. The treatment provided moderate relief. Past procedures do not include an abdominal ultrasound, an EGD, esophageal manometry, esophageal pH monitoring, H. pylori antibody titer or a UGI.  Depression        This is a chronic problem.  The current episode started more than 1 year ago.   The onset quality is gradual.   The problem occurs intermittently.  Associated symptoms include no fatigue, no helplessness, no hopelessness, no restlessness, no decreased interest, no body aches, no myalgias and no indigestion.  Compliance with treatment is variable.  Previous treatment provided mild relief.   Pertinent negatives include no hypothyroidism.   Lab Results  Component Value Date   NA 137 05/29/2019   K 4.1 05/29/2019   CO2 22 05/29/2019   GLUCOSE 268 (H) 05/29/2019   BUN 13 05/29/2019   CREATININE 0.92 05/29/2019   CALCIUM 9.6 05/29/2019   GFRNONAA 63 05/29/2019   Lab Results  Component Value Date   CHOL 254 (A) 07/01/2020   HDL 39 07/01/2020   LDLCALC 181 07/01/2020   TRIG 166 (A) 07/01/2020   CHOLHDL 2.5 06/02/2017   No results found for: "TSH" Lab Results  Component Value Date   HGBA1C 7.5 02/22/2022   Lab Results  Component Value Date   WBC 6.0 10/03/2017   HGB 13.6 10/03/2017   HCT 41.1 10/03/2017   MCV 93.4 10/03/2017   PLT 169 10/03/2017   Lab Results  Component Value Date   ALT 16 05/29/2019   AST 12 05/29/2019   ALKPHOS 110 05/29/2019   BILITOT 0.4 05/29/2019   No results found for: "25OHVITD2", "25OHVITD3", "VD25OH"   Review of Systems  Constitutional:  Negative for fatigue and fever.  HENT:  Negative for sore throat.   Respiratory:  Negative  for cough, shortness of breath and wheezing.   Cardiovascular:  Negative for chest pain, palpitations, orthopnea and PND.  Gastrointestinal:  Negative for abdominal pain, blood in stool, dysphagia, heartburn and nausea.  Endocrine: Negative for polydipsia and polyuria.  Musculoskeletal:  Negative for  arthralgias and myalgias.  Neurological:  Negative for focal weakness.  Psychiatric/Behavioral:  Positive for depression.     Patient Active Problem List   Diagnosis Date Noted   Glenohumeral arthritis, right 01/13/2021   Rotator cuff arthropathy, right 12/23/2020   LVH (left ventricular hypertrophy) due to hypertensive disease, without heart failure 08/15/2018   Obesity (BMI 30-39.9) 05/02/2018   Vitamin D deficiency 02/03/2018   Stable angina pectoris 12/13/2017   Bilateral carotid artery stenosis 12/13/2017   Recurrent major depressive disorder ([place]) 10/17/2017   Mixed hyperlipidemia 12/22/2015   Acute seasonal allergic rhinitis 12/22/2015   Acute maxillary sinusitis 12/22/2015   Incomplete bladder emptying 02/05/2014   Dyslipidemia 08/09/2013   Gastroesophageal reflux disease 08/09/2013   Essential hypertension 08/09/2013   Diabetes mellitus, type 2 ([place]) 08/09/2013   Renal cyst, acquired, left 09/11/2012   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 09/11/2012   Urge incontinence 09/11/2012   Frank hematuria 05/08/2012   Mixed incontinence 47/42/5956   Renal colic 38/75/6433    Allergies  Allergen Reactions   Ace Inhibitors Cough   Corticosteroids     Other reaction(s): OTHER   Nsaids     Other reaction(s): ANAPHYLAXIS    Past Surgical History:  Procedure Laterality Date   CERVICAL FUSION  12/07/2000   COLONOSCOPY  2006   Dr [name]   COLONOSCOPY WITH PROPOFOL N/A 07/30/2020   Procedure: COLONOSCOPY WITH PROPOFOL;  Surgeon: [name], [name], MD;  Location: [facility] ENDOSCOPY;  Service: Gastroenterology;  Laterality: N/A;  IDDM   CYSTOSTOMY  09/1984   SHOULDER SURGERY Right 11/2006   tumor removed   SUBDURAL HEMATOMA EVACUATION VIA CRANIOTOMY  04/16/2007   TONSILECTOMY, ADENOIDECTOMY, BILATERAL MYRINGOTOMY AND TUBES  03/2001   VAGINAL HYSTERECTOMY  02/23/2000    Social History   Tobacco Use   Smoking status: Former    Packs/day: 0.25    Years: 3.00    Total pack  years: 0.75    Types: Cigarettes   Smokeless tobacco: Never  Vaping Use   Vaping Use: Never used  Substance Use Topics   Alcohol use: Not Currently    Alcohol/week: 0.0 standard drinks of alcohol   Drug use: Never     Medication list has been reviewed and updated.  Current Meds  Medication Sig   alendronate (FOSAMAX) 70 MG tablet Take 1 tablet (70 mg total) by mouth every 7 (seven) days. Take with a full glass of water on an empty stomach.   amLODipine (NORVASC) 10 MG tablet Take 1 tablet by mouth once daily   calcium-vitamin D (OSCAL WITH D) 500-200 MG-UNIT tablet Take 1 tablet by mouth daily.   cetirizine (ZYRTEC) 10 MG tablet Take 1 tablet by mouth once daily   citalopram (CELEXA) 20 MG tablet Take 2 tablets by mouth daily.   clopidogrel (PLAVIX) 75 MG tablet Take 1 tablet by mouth once daily   Cyanocobalamin 1000 MCG TBCR Take 1 tablet by mouth daily.   dicyclomine (BENTYL) 10 MG capsule Take 10 mg by mouth 2 (two) times daily as needed.   esomeprazole (NEXIUM) 40 MG capsule Take 1 capsule by mouth once daily   gabapentin (NEURONTIN) 300 MG capsule Take 1 capsule by mouth every 6 (six) hours as needed. endo  glucose blood (ACCU-CHEK AVIVA PLUS) test strip USE ONE STRIP TO CHECK GLUCOSE THREE TIMES DAILY AS  DIRECTED   hydrochlorothiazide (MICROZIDE) 12.5 MG capsule Take 1 capsule by mouth once daily   insulin aspart (NOVOLOG) 100 UNIT/ML injection USE UP TO 60 UNITS PER DAY IN VGO PUMP AS DIRECTED   Insulin Disposable Pump (V-GO 40) KIT    isosorbide mononitrate (IMDUR) 30 MG 24 hr tablet Take 1 tablet by mouth daily.   liraglutide (VICTOZA) 18 MG/3ML SOPN Inject 1.8 mg into the skin daily.   losartan (COZAAR) 25 MG tablet Take 1 tablet by mouth daily.   melatonin 5 MG TABS Take 5 mg by mouth at bedtime.   metoprolol succinate (TOPROL-XL) 50 MG 24 hr tablet TAKE 1 TABLET BY MOUTH ONCE DAILY TAKE  WITH  OR  IMMEDIATLY  FOLLOWING  A  MEAL   mirabegron ER (MYRBETRIQ) 50 MG TB24  tablet Take 1 tablet (50 mg total) by mouth daily.   naproxen (NAPROSYN) 500 MG tablet Take 500 mg by mouth 2 (two) times daily with a meal.   OZEMPIC, 0.25 OR 0.5 MG/DOSE, 2 MG/1.5ML SOPN Inject into the skin. Weekly   polyethylene glycol powder (MIRALAX) 17 GM/SCOOP powder Take 1 Container by mouth once.   Probiotic Product (PROBIOTIC-10 PO) Take by mouth.   rosuvastatin (CRESTOR) 20 MG tablet Take 1 tablet by mouth once daily   SYNJARDY XR 12.07-998 MG TB24 Take 1 tablet by mouth daily.   TRESIBA FLEXTOUCH 100 UNIT/ML FlexTouch Pen Inject 27 Units into the skin daily.   Vitamin D, Ergocalciferol, (DRISDOL) 1.25 MG (50000 UNIT) CAPS capsule Take 1 capsule by mouth once a week.       02/26/2022    1:46 PM 03/12/2021    2:18 PM 01/13/2021    2:08 PM 12/23/2020    2:09 PM  GAD 7 : Generalized Anxiety Score  Nervous, Anxious, on Edge 0 0 0 0  Control/stop worrying 0 2 0 0  Worry too much - different things 0 2 0 0  Trouble relaxing 0 0 0 0  Restless 0 0 0 0  Easily annoyed or irritable 0 0 0 0  Afraid - awful might happen 0 0 0 0  Total GAD 7 Score 0 4 0 0  Anxiety Difficulty Not difficult at all Not difficult at all Not difficult at all Not difficult at all       02/26/2022    1:45 PM 05/27/2021    3:26 PM 03/12/2021    2:17 PM  Depression screen PHQ 2/9  Decreased Interest 0 0 0  Down, Depressed, Hopeless 1 0 0  PHQ - 2 Score 1 0 0  Altered sleeping 0  1  Tired, decreased energy 0  3  Change in appetite 0  1  Feeling bad or failure about yourself  0  0  Trouble concentrating 0  0  Moving slowly or fidgety/restless 0  0  Suicidal thoughts 0  0  PHQ-9 Score 1  5  Difficult doing work/chores Not difficult at all  Not difficult at all    BP Readings from Last 3 Encounters:  02/26/22 124/78  05/27/21 112/72  03/12/21 108/78    Physical Exam Vitals and nursing note reviewed. Exam conducted with a chaperone present.  Constitutional:      General: She is not in acute  distress.    Appearance: She is not diaphoretic.  HENT:     Head: Normocephalic and atraumatic.  Right Ear: Tympanic membrane and external ear normal.     Left Ear: Tympanic membrane and external ear normal.     Nose: Nose normal. No congestion or rhinorrhea.     Mouth/Throat:     Mouth: Mucous membranes are moist.  Eyes:     General:        Right eye: No discharge.        Left eye: No discharge.     Conjunctiva/sclera: Conjunctivae normal.     Pupils: Pupils are equal, round, and reactive to light.  Neck:     Thyroid: No thyromegaly.     Vascular: No JVD.  Cardiovascular:     Rate and Rhythm: Normal rate and regular rhythm.     Heart sounds: Normal heart sounds. No murmur heard.    No friction rub. No gallop.  Pulmonary:     Effort: Pulmonary effort is normal.     Breath sounds: Normal breath sounds. No wheezing or rhonchi.  Abdominal:     General: Bowel sounds are normal.     Palpations: Abdomen is soft. There is no hepatomegaly, splenomegaly or mass.     Tenderness: There is no abdominal tenderness. There is no guarding.  Musculoskeletal:        General: Normal range of motion.     Cervical back: Normal range of motion and neck supple.  Lymphadenopathy:     Cervical: No cervical adenopathy.  Skin:    General: Skin is warm and dry.  Neurological:     Mental Status: She is alert.     Wt Readings from Last 3 Encounters:  02/26/22 216 lb (98 kg)  05/27/21 215 lb 3.2 oz (97.6 kg)  03/12/21 214 lb (97.1 kg)    BP 124/78   Pulse 74   Ht _0  (1.676 m)   Wt 216 lb (98 kg)   SpO2 94%   BMI 34.86 kg/m   Assessment and Plan: 1. Essential hypertension Chronic.  Controlled.  Stable.  Blood pressure 124/78.  Continue amlodipine 10 mg once a day, hydrochlorothiazide 12.5 mg once a day, metoprolol XL 50 once a day.  Will check renal function panel for electrolytes and GFR.  Will recheck patient in 6 months. - amLODipine (NORVASC) 10 MG tablet; Take 1 tablet (10 mg  total) by mouth daily.  Dispense: 90 tablet; Refill: 1 - hydrochlorothiazide (MICROZIDE) 12.5 MG capsule; Take 1 capsule (12.5 mg total) by mouth daily.  Dispense: 90 capsule; Refill: 1 - metoprolol succinate (TOPROL-XL) 50 MG 24 hr tablet; TAKE 1 TABLET BY MOUTH ONCE DAILY TAKE  WITH  OR  IMMEDIATLY  FOLLOWING  A  MEAL  Dispense: 90 tablet; Refill: 1 - Renal Function Panel  2. Dyslipidemia .  Controlled.  Stable.  Continue rosuvastatin 20 mg once a day.  Review of lipid panel is acceptable to recheck in 6 months. - rosuvastatin (CRESTOR) 20 MG tablet; Take 1 tablet (20 mg total) by mouth daily.  Dispense: 90 tablet; Refill: 1  3. Gastroesophageal reflux disease Chronic.  Controlled.  Stable.  Continue Nexium 40 mg once a day. - esomeprazole (NEXIUM) 40 MG capsule; Take 1 capsule (40 mg total) by mouth daily.  Dispense: 90 capsule; Refill: 1  4. Recurrent major depressive disorder, remission status unspecified (HCC) Chronic.  Controlled.  Stable.  Continue citalopram 20 mg 2 tablets once a day.  Will check and 6 months. - citalopram (CELEXA) 20 MG tablet; Take 2 tablets (40 mg total) by mouth daily.  Dispense: 180 tablet; Refill: 1  5. Cerebrovascular disease Chronic.  Stable.  Continue Plavix 75 mg once a day. - clopidogrel (PLAVIX) 75 MG tablet; Take 1 tablet (75 mg total) by mouth daily.  Dispense: 90 tablet; Refill: 1  6. Osteoporosis without current pathological fracture, unspecified osteoporosis type Chronic.  Controlled.  Stable.  Continue Fosamax 70 mg once a week. - alendronate (FOSAMAX) 70 MG tablet; Take 1 tablet (70 mg total) by mouth every 7 (seven) days. Take with a full glass of water on an empty stomach.  Dispense: 12 tablet; Refill: 1  7. Seasonal allergic rhinitis due to pollen Chronic.  Controlled.  Stable.  Continue Zyrtec 10 mg once a day.  Particular during pollen season. - cetirizine (ZYRTEC) 10 MG tablet; Take 1 tablet (10 mg total) by mouth daily.  Dispense: 90  tablet; Refill: 1  8. Need for immunization against influenza Discussed and administered - Flu Vaccine QUAD High Dose(Fluad)  9. Need for COVID-19 vaccine Discussed and administered [employer] Fall 2023 Covid-19 Vaccine 24yr and older     DOtilio Miu MD

## 2022-02-27 LAB — RENAL FUNCTION PANEL
Albumin: 4.7 g/dL (ref 3.8–4.8)
BUN/Creatinine Ratio: 14 (ref 12–28)
BUN: 13 mg/dL (ref 8–27)
CO2: 21 mmol/L (ref 20–29)
Calcium: 9.5 mg/dL (ref 8.7–10.3)
Chloride: 102 mmol/L (ref 96–106)
Creatinine, Ser: 0.94 mg/dL (ref 0.57–1.00)
Glucose: 150 mg/dL — ABNORMAL HIGH (ref 70–99)
Phosphorus: 3.4 mg/dL (ref 3.0–4.3)
Potassium: 4.6 mmol/L (ref 3.5–5.2)
Sodium: 140 mmol/L (ref 134–144)
eGFR: 64 mL/min/{1.73_m2} (ref >59)

## 2022-03-12 DIAGNOSIS — E119 Type 2 diabetes mellitus without complications: Secondary | ICD-10-CM | POA: Diagnosis not present

## 2022-03-12 DIAGNOSIS — Z0100 Encounter for examination of eyes and vision without abnormal findings: Secondary | ICD-10-CM | POA: Diagnosis not present

## 2022-03-12 LAB — HM DIABETES EYE EXAM

## 2022-03-26 DIAGNOSIS — E1142 Type 2 diabetes mellitus with diabetic polyneuropathy: Secondary | ICD-10-CM | POA: Diagnosis not present

## 2022-04-19 ENCOUNTER — Encounter: Payer: Self-pay | Admitting: Family Medicine

## 2022-04-19 ENCOUNTER — Ambulatory Visit: Payer: Medicare PPO | Admitting: Family Medicine

## 2022-04-19 VITALS — BP 120/70 | HR 76 | Ht 66.0 in | Wt 217.0 lb

## 2022-04-19 DIAGNOSIS — L918 Other hypertrophic disorders of the skin: Secondary | ICD-10-CM | POA: Diagnosis not present

## 2022-04-19 DIAGNOSIS — L821 Other seborrheic keratosis: Secondary | ICD-10-CM | POA: Diagnosis not present

## 2022-04-19 NOTE — Progress Notes (Signed)
Date:  04/19/2022   Name:  Tracey Russo   DOB:  25-Jun-1950   MRN:  [id number]   Chief Complaint: Nevus (2 moles that have changed in color and size on back- always been there, just getting bigger and darker)  Patient is a 72 year old female who presents for a two skin lesions back exam. The patient reports the following problems: skin lesions. Health maintenance has been reviewed up to date.      Lab Results  Component Value Date   NA 140 02/26/2022   K 4.6 02/26/2022   CO2 21 02/26/2022   GLUCOSE 150 (H) 02/26/2022   BUN 13 02/26/2022   CREATININE 0.94 02/26/2022   CALCIUM 9.5 02/26/2022   EGFR 64 02/26/2022   GFRNONAA 63 05/29/2019   Lab Results  Component Value Date   CHOL 254 (A) 07/01/2020   HDL 39 07/01/2020   LDLCALC 181 07/01/2020   TRIG 166 (A) 07/01/2020   CHOLHDL 2.5 06/02/2017   No results found for: "TSH" Lab Results  Component Value Date   HGBA1C 7.5 02/22/2022   Lab Results  Component Value Date   WBC 6.0 10/03/2017   HGB 13.6 10/03/2017   HCT 41.1 10/03/2017   MCV 93.4 10/03/2017   PLT 169 10/03/2017   Lab Results  Component Value Date   ALT 16 05/29/2019   AST 12 05/29/2019   ALKPHOS 110 05/29/2019   BILITOT 0.4 05/29/2019   No results found for: "25OHVITD2", "25OHVITD3", "VD25OH"   Review of Systems  Constitutional: Negative.  Negative for chills, fatigue, fever and unexpected weight change.  HENT:  Negative for congestion, ear discharge, ear pain, rhinorrhea, sinus pressure, sneezing and sore throat.   Respiratory:  Negative for cough, shortness of breath, wheezing and stridor.   Gastrointestinal:  Negative for abdominal pain, blood in stool, constipation, diarrhea and nausea.  Genitourinary:  Negative for dysuria, flank pain, frequency, hematuria, urgency and vaginal discharge.  Musculoskeletal:  Negative for arthralgias, back pain and myalgias.  Skin:  Negative for rash.  Neurological:  Negative for dizziness, weakness and  headaches.  Hematological:  Negative for adenopathy. Does not bruise/bleed easily.  Psychiatric/Behavioral:  Negative for dysphoric mood. The patient is not nervous/anxious.     Patient Active Problem List   Diagnosis Date Noted   Glenohumeral arthritis, right 01/13/2021   Rotator cuff arthropathy, right 12/23/2020   LVH (left ventricular hypertrophy) due to hypertensive disease, without heart failure 08/15/2018   Obesity (BMI 30-39.9) 05/02/2018   Vitamin D deficiency 02/03/2018   Stable angina pectoris 12/13/2017   Bilateral carotid artery stenosis 12/13/2017   Recurrent major depressive disorder ([place]) 10/17/2017   Mixed hyperlipidemia 12/22/2015   Acute seasonal allergic rhinitis 12/22/2015   Acute maxillary sinusitis 12/22/2015   Incomplete bladder emptying 02/05/2014   Dyslipidemia 08/09/2013   Gastroesophageal reflux disease 08/09/2013   Essential hypertension 08/09/2013   Diabetes mellitus, type 2 ([place]) 08/09/2013   Renal cyst, acquired, left 09/11/2012   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 09/11/2012   Urge incontinence 09/11/2012   Frank hematuria 05/08/2012   Mixed incontinence 31/54/0086   Renal colic 76/19/5093    Allergies  Allergen Reactions   Ace Inhibitors Cough   Corticosteroids     Other reaction(s): OTHER   Nsaids     Other reaction(s): ANAPHYLAXIS    Past Surgical History:  Procedure Laterality Date   CERVICAL FUSION  12/07/2000   COLONOSCOPY  2006   Dr [name]   COLONOSCOPY WITH PROPOFOL  N/A 07/30/2020   Procedure: COLONOSCOPY WITH PROPOFOL;  Surgeon: [name], [name], MD;  Location: [facility] ENDOSCOPY;  Service: Gastroenterology;  Laterality: N/A;  IDDM   CYSTOSTOMY  09/1984   SHOULDER SURGERY Right 11/2006   tumor removed   SUBDURAL HEMATOMA EVACUATION VIA CRANIOTOMY  04/16/2007   TONSILECTOMY, ADENOIDECTOMY, BILATERAL MYRINGOTOMY AND TUBES  03/2001   VAGINAL HYSTERECTOMY  02/23/2000    Social History   Tobacco Use   Smoking status: Former     Packs/day: 0.25    Years: 3.00    Total pack years: 0.75    Types: Cigarettes   Smokeless tobacco: Never  Vaping Use   Vaping Use: Never used  Substance Use Topics   Alcohol use: Not Currently    Alcohol/week: 0.0 standard drinks of alcohol   Drug use: Never     Medication list has been reviewed and updated.  Current Meds  Medication Sig   alendronate (FOSAMAX) 70 MG tablet Take 1 tablet (70 mg total) by mouth every 7 (seven) days. Take with a full glass of water on an empty stomach.   amLODipine (NORVASC) 10 MG tablet Take 1 tablet (10 mg total) by mouth daily.   calcium-vitamin D (OSCAL WITH D) 500-200 MG-UNIT tablet Take 1 tablet by mouth daily.   cetirizine (ZYRTEC) 10 MG tablet Take 1 tablet (10 mg total) by mouth daily.   citalopram (CELEXA) 20 MG tablet Take 2 tablets (40 mg total) by mouth daily.   clopidogrel (PLAVIX) 75 MG tablet Take 1 tablet (75 mg total) by mouth daily.   Cyanocobalamin 1000 MCG TBCR Take 1 tablet by mouth daily.   dicyclomine (BENTYL) 10 MG capsule Take 10 mg by mouth 2 (two) times daily as needed.   esomeprazole (NEXIUM) 40 MG capsule Take 1 capsule (40 mg total) by mouth daily.   gabapentin (NEURONTIN) 300 MG capsule Take 1 capsule by mouth every 6 (six) hours as needed. endo   glucose blood (ACCU-CHEK AVIVA PLUS) test strip USE ONE STRIP TO CHECK GLUCOSE THREE TIMES DAILY AS  DIRECTED   hydrochlorothiazide (MICROZIDE) 12.5 MG capsule Take 1 capsule (12.5 mg total) by mouth daily.   insulin aspart (NOVOLOG) 100 UNIT/ML injection USE UP TO 60 UNITS PER DAY IN VGO PUMP AS DIRECTED   Insulin Disposable Pump (V-GO 40) KIT    isosorbide mononitrate (IMDUR) 30 MG 24 hr tablet Take 1 tablet by mouth daily.   liraglutide (VICTOZA) 18 MG/3ML SOPN Inject 1.8 mg into the skin daily.   melatonin 5 MG TABS Take 5 mg by mouth at bedtime.   metoprolol succinate (TOPROL-XL) 50 MG 24 hr tablet TAKE 1 TABLET BY MOUTH ONCE DAILY TAKE  WITH  OR  IMMEDIATLY   FOLLOWING  A  MEAL   mirabegron ER (MYRBETRIQ) 50 MG TB24 tablet Take 1 tablet (50 mg total) by mouth daily.   naproxen (NAPROSYN) 500 MG tablet Take 500 mg by mouth 2 (two) times daily with a meal.   OZEMPIC, 0.25 OR 0.5 MG/DOSE, 2 MG/1.5ML SOPN Inject into the skin. Weekly   polyethylene glycol powder (MIRALAX) 17 GM/SCOOP powder Take 1 Container by mouth once.   Probiotic Product (PROBIOTIC-10 PO) Take by mouth.   rosuvastatin (CRESTOR) 20 MG tablet Take 1 tablet (20 mg total) by mouth daily.   SYNJARDY XR 12.07-998 MG TB24 Take 1 tablet by mouth daily.   TRESIBA FLEXTOUCH 100 UNIT/ML FlexTouch Pen Inject 27 Units into the skin daily.   Vitamin D, Ergocalciferol, (DRISDOL) 1.25  MG (50000 UNIT) CAPS capsule Take 1 capsule by mouth once a week.       04/19/2022    2:18 PM 02/26/2022    1:46 PM 03/12/2021    2:18 PM 01/13/2021    2:08 PM  GAD 7 : Generalized Anxiety Score  Nervous, Anxious, on Edge 0 0 0 0  Control/stop worrying 0 0 2 0  Worry too much - different things 0 0 2 0  Trouble relaxing 0 0 0 0  Restless 0 0 0 0  Easily annoyed or irritable 0 0 0 0  Afraid - awful might happen 0 0 0 0  Total GAD 7 Score 0 0 4 0  Anxiety Difficulty Not difficult at all Not difficult at all Not difficult at all Not difficult at all       04/19/2022    2:18 PM 02/26/2022    1:45 PM 05/27/2021    3:26 PM  Depression screen PHQ 2/9  Decreased Interest 0 0 0  Down, Depressed, Hopeless 0 1 0  PHQ - 2 Score 0 1 0  Altered sleeping 0 0   Tired, decreased energy 0 0   Change in appetite 0 0   Feeling bad or failure about yourself  0 0   Trouble concentrating 0 0   Moving slowly or fidgety/restless 0 0   Suicidal thoughts 0 0   PHQ-9 Score 0 1   Difficult doing work/chores Not difficult at all Not difficult at all     BP Readings from Last 3 Encounters:  04/19/22 120/70  02/26/22 124/78  05/27/21 112/72    Physical Exam Vitals and nursing note reviewed. Exam conducted with a  chaperone present.  Constitutional:      General: She is not in acute distress.    Appearance: She is not diaphoretic.  HENT:     Head: Normocephalic and atraumatic.  Eyes:     Pupils: Pupils are equal, round, and reactive to light.  Neck:     Thyroid: No thyromegaly.     Vascular: No JVD.  Cardiovascular:     Rate and Rhythm: Normal rate and regular rhythm.     Heart sounds: Normal heart sounds, S1 normal and S2 normal. No murmur heard.    No systolic murmur is present.     No diastolic murmur is present.     No friction rub. No gallop. No S3 or S4 sounds.  Pulmonary:     Breath sounds: Normal breath sounds. No wheezing or rhonchi.  Abdominal:     Palpations: Abdomen is soft. There is no mass.     Tenderness: There is no abdominal tenderness. There is no guarding or rebound.  Musculoskeletal:        General: Normal range of motion.     Cervical back: Neck supple.  Lymphadenopathy:     Cervical: No cervical adenopathy.  Skin:    General: Skin is warm and dry.  Neurological:     Mental Status: She is alert.     Deep Tendon Reflexes: Reflexes are normal and symmetric.     Wt Readings from Last 3 Encounters:  04/19/22 217 lb (98.4 kg)  02/26/22 216 lb (98 kg)  05/27/21 215 lb 3.2 oz (97.6 kg)    BP 120/70   Pulse 76   Ht '5\' 6"'$  (1.676 m)   Wt 217 lb (98.4 kg)   SpO2 94%   BMI 35.02 kg/m   Assessment and Plan:  1. Skin tag Probably  been there for some time but the patient has noticed that since she has had to wear bras in public more often.  This is an elevated area which is now being irritated by the bra and we will refer to dermatology for possible removal. - Ambulatory referral to Dermatology  2. Seborrheic keratoses Up on the right scapular area posterior is a scaly area consistent with a seborrheic keratoses which is also becoming irritating at times and patient would like to have this removal as well. - Ambulatory referral to Dermatology    [name],  MD

## 2022-04-26 DIAGNOSIS — E1142 Type 2 diabetes mellitus with diabetic polyneuropathy: Secondary | ICD-10-CM | POA: Diagnosis not present

## 2022-05-13 DIAGNOSIS — I781 Nevus, non-neoplastic: Secondary | ICD-10-CM | POA: Diagnosis not present

## 2022-05-13 DIAGNOSIS — L821 Other seborrheic keratosis: Secondary | ICD-10-CM | POA: Diagnosis not present

## 2022-05-14 ENCOUNTER — Telehealth: Payer: Self-pay | Admitting: Family Medicine

## 2022-05-14 NOTE — Telephone Encounter (Signed)
Called patient to reschedule Medicare Annual Wellness Visit (AWV) 05/31/2022. Spoke with spouse he stated she was asleep and to CB this afternoon to r/s appt.  Last date of AWV: 05/27/2021  Please schedule an appointment at any time with Kirke Shaggy, NHA  .    Thank you ,  Sherol Dade; [place] Direct Dial: [phone]

## 2022-05-14 NOTE — Telephone Encounter (Signed)
Contacted Barnabas Lister to schedule their annual wellness visit. Appointment made for 06/03/2022.  Sherol Dade; [facility] Direct Dial: [phone]

## 2022-05-25 DIAGNOSIS — E1142 Type 2 diabetes mellitus with diabetic polyneuropathy: Secondary | ICD-10-CM | POA: Diagnosis not present

## 2022-05-31 ENCOUNTER — Ambulatory Visit: Payer: Medicare PPO

## 2022-06-15 DIAGNOSIS — G4733 Obstructive sleep apnea (adult) (pediatric): Secondary | ICD-10-CM | POA: Diagnosis not present

## 2022-06-25 DIAGNOSIS — E1142 Type 2 diabetes mellitus with diabetic polyneuropathy: Secondary | ICD-10-CM | POA: Diagnosis not present

## 2022-07-26 DIAGNOSIS — E1159 Type 2 diabetes mellitus with other circulatory complications: Secondary | ICD-10-CM | POA: Diagnosis not present

## 2022-07-26 DIAGNOSIS — E785 Hyperlipidemia, unspecified: Secondary | ICD-10-CM | POA: Diagnosis not present

## 2022-07-26 DIAGNOSIS — Z79899 Other long term (current) drug therapy: Secondary | ICD-10-CM | POA: Diagnosis not present

## 2022-07-26 DIAGNOSIS — E559 Vitamin D deficiency, unspecified: Secondary | ICD-10-CM | POA: Diagnosis not present

## 2022-07-26 DIAGNOSIS — E1169 Type 2 diabetes mellitus with other specified complication: Secondary | ICD-10-CM | POA: Diagnosis not present

## 2022-07-26 DIAGNOSIS — I152 Hypertension secondary to endocrine disorders: Secondary | ICD-10-CM | POA: Diagnosis not present

## 2022-07-26 DIAGNOSIS — Z794 Long term (current) use of insulin: Secondary | ICD-10-CM | POA: Diagnosis not present

## 2022-07-26 DIAGNOSIS — E1142 Type 2 diabetes mellitus with diabetic polyneuropathy: Secondary | ICD-10-CM | POA: Diagnosis not present

## 2022-08-30 ENCOUNTER — Other Ambulatory Visit: Payer: Self-pay

## 2022-08-30 ENCOUNTER — Encounter: Payer: Self-pay | Admitting: Family Medicine

## 2022-08-30 ENCOUNTER — Ambulatory Visit: Payer: Medicare PPO | Admitting: Family Medicine

## 2022-08-30 VITALS — BP 124/76 | HR 82 | Ht 66.0 in | Wt 212.0 lb

## 2022-08-30 DIAGNOSIS — I679 Cerebrovascular disease, unspecified: Secondary | ICD-10-CM

## 2022-08-30 DIAGNOSIS — I10 Essential (primary) hypertension: Secondary | ICD-10-CM

## 2022-08-30 DIAGNOSIS — E785 Hyperlipidemia, unspecified: Secondary | ICD-10-CM | POA: Diagnosis not present

## 2022-08-30 DIAGNOSIS — F339 Major depressive disorder, recurrent, unspecified: Secondary | ICD-10-CM

## 2022-08-30 DIAGNOSIS — M810 Age-related osteoporosis without current pathological fracture: Secondary | ICD-10-CM

## 2022-08-30 DIAGNOSIS — M12811 Other specific arthropathies, not elsewhere classified, right shoulder: Secondary | ICD-10-CM

## 2022-08-30 MED ORDER — CITALOPRAM HYDROBROMIDE 20 MG PO TABS: 40.0000 mg | ORAL_TABLET | Freq: Every day | ORAL | 1 refills | Status: DC

## 2022-08-30 MED ORDER — AMLODIPINE BESYLATE 10 MG PO TABS: 10.0000 mg | ORAL_TABLET | Freq: Every day | ORAL | 1 refills | Status: DC

## 2022-08-30 MED ORDER — ROSUVASTATIN CALCIUM 20 MG PO TABS: 20.0000 mg | ORAL_TABLET | Freq: Every day | ORAL | 1 refills | Status: DC

## 2022-08-30 MED ORDER — METOPROLOL SUCCINATE ER 50 MG PO TB24
ORAL_TABLET | ORAL | 1 refills | Status: DC
Start: 2022-08-30 — End: 2023-03-01

## 2022-08-30 MED ORDER — ALENDRONATE SODIUM 70 MG PO TABS: 70.0000 mg | ORAL_TABLET | ORAL | 1 refills | Status: DC

## 2022-08-30 MED ORDER — HYDROCHLOROTHIAZIDE 12.5 MG PO CAPS: 12.5000 mg | ORAL_CAPSULE | Freq: Every day | ORAL | 1 refills | Status: DC

## 2022-08-30 MED ORDER — CLOPIDOGREL BISULFATE 75 MG PO TABS: 75.0000 mg | ORAL_TABLET | Freq: Every day | ORAL | 1 refills | Status: DC

## 2022-08-30 NOTE — Progress Notes (Signed)
Date:  08/30/2022   Name:  Tracey Russo   DOB:  April 05, 1950   MRN:  [id number]   Chief Complaint: Osteoporosis, Hypertension, Coronary Artery Disease, Hyperlipidemia, Depression (15 and 6), and Shoulder Pain (R) shoulder pain d/t torn rotator cuff- wants to go to ortho surgery)  Hypertension This is a chronic problem. The current episode started more than 1 year ago. The problem has been gradually improving since onset. The problem is controlled. Pertinent negatives include no blurred vision, chest pain, headaches, orthopnea, palpitations, PND or shortness of breath. There are no associated agents to hypertension. Risk factors for coronary artery disease include dyslipidemia. Past treatments include ACE inhibitors, diuretics and beta blockers. The current treatment provides moderate improvement. There are no compliance problems.  There is no history of angina, CAD/MI or PVD. There is no history of chronic renal disease, a hypertension causing med or renovascular disease.  Coronary Artery Disease Presents for follow-up visit. Pertinent negatives include no chest pain, dizziness, palpitations or shortness of breath. Risk factors include hyperlipidemia and hypertension.  Hyperlipidemia This is a chronic problem. The problem is controlled. Recent lipid tests were reviewed and are normal. She has no history of chronic renal disease. Pertinent negatives include no chest pain, myalgias or shortness of breath. Current antihyperlipidemic treatment includes statins. The current treatment provides moderate improvement of lipids. There are no compliance problems.   Depression        This is a chronic problem.  The current episode started more than 1 year ago.   The onset quality is gradual.   The problem has been gradually improving since onset.  Associated symptoms include no decreased concentration, no fatigue, no helplessness, no hopelessness, does not have insomnia, not irritable, no restlessness, no  decreased interest, no appetite change, no body aches, no myalgias, no headaches, no indigestion, not sad and no suicidal ideas.  Past treatments include SSRIs - Selective serotonin reuptake inhibitors.  Compliance with treatment is good. Shoulder Pain  Pertinent negatives include no fever.    Lab Results  Component Value Date   NA 140 02/26/2022   K 4.6 02/26/2022   CO2 21 02/26/2022   GLUCOSE 150 (H) 02/26/2022   BUN 13 02/26/2022   CREATININE 0.94 02/26/2022   CALCIUM 9.5 02/26/2022   EGFR 64 02/26/2022   GFRNONAA 63 05/29/2019   Lab Results  Component Value Date   CHOL 254 (A) 07/01/2020   HDL 39 07/01/2020   LDLCALC 181 07/01/2020   TRIG 166 (A) 07/01/2020   CHOLHDL 2.5 06/02/2017   No results found for: "TSH" Lab Results  Component Value Date   HGBA1C 7.5 02/22/2022   Lab Results  Component Value Date   WBC 6.0 10/03/2017   HGB 13.6 10/03/2017   HCT 41.1 10/03/2017   MCV 93.4 10/03/2017   PLT 169 10/03/2017   Lab Results  Component Value Date   ALT 16 05/29/2019   AST 12 05/29/2019   ALKPHOS 110 05/29/2019   BILITOT 0.4 05/29/2019   No results found for: "25OHVITD2", "25OHVITD3", "VD25OH"   Review of Systems  Constitutional: Negative.  Negative for appetite change, chills, fatigue, fever and unexpected weight change.  HENT:  Negative for congestion, ear discharge, ear pain, rhinorrhea, sinus pressure, sneezing and sore throat.   Eyes:  Negative for blurred vision.  Respiratory:  Negative for cough, shortness of breath, wheezing and stridor.   Cardiovascular:  Negative for chest pain, palpitations, orthopnea and PND.  Gastrointestinal:  Negative for abdominal  pain, blood in stool, constipation, diarrhea and nausea.  Genitourinary:  Negative for dysuria, flank pain, frequency, hematuria, urgency and vaginal discharge.  Musculoskeletal:  Positive for arthralgias. Negative for back pain and myalgias.  Skin:  Negative for rash.  Neurological:  Negative for  dizziness, weakness and headaches.  Hematological:  Negative for adenopathy. Does not bruise/bleed easily.  Psychiatric/Behavioral:  Positive for depression. Negative for decreased concentration, dysphoric mood and suicidal ideas. The patient is not nervous/anxious and does not have insomnia.     Patient Active Problem List   Diagnosis Date Noted   Glenohumeral arthritis, right 01/13/2021   Rotator cuff arthropathy, right 12/23/2020   LVH (left ventricular hypertrophy) due to hypertensive disease, without heart failure 08/15/2018   Obesity (BMI 30-39.9) 05/02/2018   Vitamin D deficiency 02/03/2018   Stable angina pectoris 12/13/2017   Bilateral carotid artery stenosis 12/13/2017   Recurrent major depressive disorder ([facility]) 10/17/2017   Mixed hyperlipidemia 12/22/2015   Acute seasonal allergic rhinitis 12/22/2015   Acute maxillary sinusitis 12/22/2015   Incomplete bladder emptying 02/05/2014   Dyslipidemia 08/09/2013   Gastroesophageal reflux disease 08/09/2013   Essential hypertension 08/09/2013   Diabetes mellitus, type 2 ([facility]) 08/09/2013   Renal cyst, acquired, left 09/11/2012   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 09/11/2012   Urge incontinence 09/11/2012   Frank hematuria 05/08/2012   Mixed incontinence 05/08/2012   Renal colic 05/08/2012    Allergies  Allergen Reactions   Ace Inhibitors Cough   Corticosteroids     Other reaction(s): OTHER   Nsaids     Other reaction(s): ANAPHYLAXIS    Past Surgical History:  Procedure Laterality Date   CERVICAL FUSION  12/07/2000   COLONOSCOPY  2006   Dr Mechele Collin   COLONOSCOPY WITH PROPOFOL N/A 07/30/2020   Procedure: COLONOSCOPY WITH PROPOFOL;  Surgeon: [name], Boykin Nearing, MD;  Location: [facility] ENDOSCOPY;  Service: Gastroenterology;  Laterality: N/A;  IDDM   CYSTOSTOMY  09/1984   SHOULDER SURGERY Right 11/2006   tumor removed   SUBDURAL HEMATOMA EVACUATION VIA CRANIOTOMY  04/16/2007   TONSILECTOMY, ADENOIDECTOMY, BILATERAL MYRINGOTOMY  AND TUBES  03/2001   VAGINAL HYSTERECTOMY  02/23/2000    Social History   Tobacco Use   Smoking status: Former    Packs/day: 0.25    Years: 3.00    Additional pack years: 0.00    Total pack years: 0.75    Types: Cigarettes   Smokeless tobacco: Never  Vaping Use   Vaping Use: Never used  Substance Use Topics   Alcohol use: Not Currently    Alcohol/week: 0.0 standard drinks of alcohol   Drug use: Never     Medication list has been reviewed and updated.  Current Meds  Medication Sig   alendronate (FOSAMAX) 70 MG tablet Take 1 tablet (70 mg total) by mouth every 7 (seven) days. Take with a full glass of water on an empty stomach.   amLODipine (NORVASC) 10 MG tablet Take 1 tablet (10 mg total) by mouth daily.   calcium-vitamin D (OSCAL WITH D) 500-200 MG-UNIT tablet Take 1 tablet by mouth daily.   cetirizine (ZYRTEC) 10 MG tablet Take 1 tablet (10 mg total) by mouth daily.   citalopram (CELEXA) 20 MG tablet Take 2 tablets (40 mg total) by mouth daily.   clopidogrel (PLAVIX) 75 MG tablet Take 1 tablet (75 mg total) by mouth daily.   Cyanocobalamin 1000 MCG TBCR Take 1 tablet by mouth daily.   dicyclomine (BENTYL) 10 MG capsule Take 10 mg by mouth  2 (two) times daily as needed.   esomeprazole (NEXIUM) 40 MG capsule Take 1 capsule (40 mg total) by mouth daily.   gabapentin (NEURONTIN) 300 MG capsule Take 1 capsule by mouth every 6 (six) hours as needed. endo   glucose blood (ACCU-CHEK AVIVA PLUS) test strip USE ONE STRIP TO CHECK GLUCOSE THREE TIMES DAILY AS  DIRECTED   hydrochlorothiazide (MICROZIDE) 12.5 MG capsule Take 1 capsule (12.5 mg total) by mouth daily.   insulin aspart (NOVOLOG) 100 UNIT/ML injection USE UP TO 60 UNITS PER DAY IN VGO PUMP AS DIRECTED   Insulin Disposable Pump (V-GO 40) KIT    isosorbide mononitrate (IMDUR) 30 MG 24 hr tablet Take 1 tablet by mouth daily.   liraglutide (VICTOZA) 18 MG/3ML SOPN Inject 1.8 mg into the skin daily.   losartan (COZAAR) 25 MG  tablet Take 1 tablet by mouth daily.   melatonin 5 MG TABS Take 5 mg by mouth at bedtime.   metoprolol succinate (TOPROL-XL) 50 MG 24 hr tablet TAKE 1 TABLET BY MOUTH ONCE DAILY TAKE  WITH  OR  IMMEDIATLY  FOLLOWING  A  MEAL   mirabegron ER (MYRBETRIQ) 50 MG TB24 tablet Take 1 tablet (50 mg total) by mouth daily.   naproxen (NAPROSYN) 500 MG tablet Take 500 mg by mouth 2 (two) times daily with a meal.   OZEMPIC, 0.25 OR 0.5 MG/DOSE, 2 MG/1.5ML SOPN Inject into the skin. Weekly   polyethylene glycol powder (MIRALAX) 17 GM/SCOOP powder Take 1 Container by mouth once.   Probiotic Product (PROBIOTIC-10 PO) Take by mouth.   rosuvastatin (CRESTOR) 20 MG tablet Take 1 tablet (20 mg total) by mouth daily.   SYNJARDY XR 12.07-998 MG TB24 Take 1 tablet by mouth daily.   TRESIBA FLEXTOUCH 100 UNIT/ML FlexTouch Pen Inject 27 Units into the skin daily.   Vitamin D, Ergocalciferol, (DRISDOL) 1.25 MG (50000 UNIT) CAPS capsule Take 1 capsule by mouth once a week.       08/30/2022    1:38 PM 04/19/2022    2:18 PM 02/26/2022    1:46 PM 03/12/2021    2:18 PM  GAD 7 : Generalized Anxiety Score  Nervous, Anxious, on Edge 1 0 0 0  Control/stop worrying 0 0 0 2  Worry too much - different things 0 0 0 2  Trouble relaxing 1 0 0 0  Restless 1 0 0 0  Easily annoyed or irritable 3 0 0 0  Afraid - awful might happen 0 0 0 0  Total GAD 7 Score 6 0 0 4  Anxiety Difficulty Very difficult Not difficult at all Not difficult at all Not difficult at all       08/30/2022    1:37 PM 04/19/2022    2:18 PM 02/26/2022    1:45 PM  Depression screen PHQ 2/9  Decreased Interest 3 0 0  Down, Depressed, Hopeless 1 0 1  PHQ - 2 Score 4 0 1  Altered sleeping 3 0 0  Tired, decreased energy 2 0 0  Change in appetite 2 0 0  Feeling bad or failure about yourself  1 0 0  Trouble concentrating 1 0 0  Moving slowly or fidgety/restless 1 0 0  Suicidal thoughts 1 0 0  PHQ-9 Score 15 0 1  Difficult doing work/chores Extremely  dIfficult Not difficult at all Not difficult at all    BP Readings from Last 3 Encounters:  08/30/22 124/76  04/19/22 120/70  02/26/22 124/78    Physical  Exam Vitals and nursing note reviewed. Exam conducted with a chaperone present.  Constitutional:      General: She is not irritable.She is not in acute distress.    Appearance: She is not diaphoretic.  HENT:     Head: Normocephalic and atraumatic.     Right Ear: Tympanic membrane and external ear normal.     Left Ear: Tympanic membrane and external ear normal.     Nose: Nose normal.     Mouth/Throat:     Mouth: Mucous membranes are moist.  Eyes:     General:        Right eye: No discharge.        Left eye: No discharge.     Conjunctiva/sclera: Conjunctivae normal.     Pupils: Pupils are equal, round, and reactive to light.  Neck:     Thyroid: No thyromegaly.     Vascular: No JVD.  Cardiovascular:     Rate and Rhythm: Normal rate and regular rhythm.     Heart sounds: Normal heart sounds. No murmur heard.    No friction rub. No gallop.  Pulmonary:     Effort: Pulmonary effort is normal.     Breath sounds: Normal breath sounds. No wheezing, rhonchi or rales.  Abdominal:     General: Bowel sounds are normal.     Palpations: Abdomen is soft. There is no mass.     Tenderness: There is no abdominal tenderness. There is no guarding.  Musculoskeletal:        General: Normal range of motion.     Cervical back: Normal range of motion and neck supple.  Lymphadenopathy:     Cervical: No cervical adenopathy.  Skin:    General: Skin is warm and dry.  Neurological:     Mental Status: She is alert.     Motor: No weakness.     Deep Tendon Reflexes: Reflexes are normal and symmetric.     Wt Readings from Last 3 Encounters:  08/30/22 212 lb (96.2 kg)  04/19/22 217 lb (98.4 kg)  02/26/22 216 lb (98 kg)    BP 124/76   Pulse 82   Ht 5\' 6"  (1.676 m)   Wt 212 lb (96.2 kg)   SpO2 97%   BMI 34.22 kg/m   Assessment and  Plan: 1. Essential hypertension Chronic.  Controlled.  Stable.  Blood pressure 124/76.  Asymptomatic.  Tolerating medication well.  Continue amlodipine 10 mg once a day, hydrochlorothiazide 12.5 mg once a day, and metoprolol XL 50 mg once a day. - amLODipine (NORVASC) 10 MG tablet; Take 1 tablet (10 mg total) by mouth daily.  Dispense: 90 tablet; Refill: 1 - hydrochlorothiazide (MICROZIDE) 12.5 MG capsule; Take 1 capsule (12.5 mg total) by mouth daily.  Dispense: 90 capsule; Refill: 1 - metoprolol succinate (TOPROL-XL) 50 MG 24 hr tablet; TAKE 1 TABLET BY MOUTH ONCE DAILY TAKE  WITH  OR  IMMEDIATLY  FOLLOWING  A  MEAL  Dispense: 90 tablet; Refill: 1  2. Osteoporosis without current pathological fracture, unspecified osteoporosis type Chronic.  Controlled.  Stable.  Tolerating and will continue Fosamax 70 mg once a week. - alendronate (FOSAMAX) 70 MG tablet; Take 1 tablet (70 mg total) by mouth every 7 (seven) days. Take with a full glass of water on an empty stomach.  Dispense: 12 tablet; Refill: 1  3. Recurrent major depressive disorder, remission status unspecified (HCC) Chronic.  Controlled.  Stable.  PHQ is 0 GAD score 0 continue citalopram 20  mg twice a day.  Will recheck and 6 months. - citalopram (CELEXA) 20 MG tablet; Take 2 tablets (40 mg total) by mouth daily.  Dispense: 180 tablet; Refill: 1  4. Cerebrovascular disease .  Asymptomatic.  Stable.  No new signs of focal sensory or motor weakness.  Continue Plavix 75 mg once a day. - clopidogrel (PLAVIX) 75 MG tablet; Take 1 tablet (75 mg total) by mouth daily.  Dispense: 90 tablet; Refill: 1  5. Dyslipidemia Chronic.  Controlled.  Stable.  Continue rosuvastatin 20 mg once a day. - rosuvastatin (CRESTOR) 20 MG tablet; Take 1 tablet (20 mg total) by mouth daily.  Dispense: 90 tablet; Refill: 1   Patient has a history of shoulder pain which is noted to be rotator cuff injury from previous x-rays and injections.  Patient has reached a  plateau with conservative measures and has requested having appointment for consideration of surgical correction at this time.  Discussed with sports medicine nurse and she will bring it to the attention of Dr. Ashley Royalty and likely will proceed with referral to Dr. Lawerance Bach.  Elizabeth Sauer, MD

## 2022-09-01 ENCOUNTER — Ambulatory Visit (INDEPENDENT_AMBULATORY_CARE_PROVIDER_SITE_OTHER): Payer: Medicare PPO

## 2022-09-01 VITALS — Ht 66.0 in | Wt 212.0 lb

## 2022-09-01 DIAGNOSIS — Z0000 Encounter for general adult medical examination without abnormal findings: Secondary | ICD-10-CM

## 2022-09-01 DIAGNOSIS — Z1231 Encounter for screening mammogram for malignant neoplasm of breast: Secondary | ICD-10-CM

## 2022-09-01 NOTE — Patient Instructions (Signed)
Tracey Russo , Thank you for taking time to come for your Medicare Wellness Visit. I appreciate your ongoing commitment to your health goals. Please review the following plan we discussed and let me know if I can assist you in the future.   These are the goals we discussed:  Goals      DIET - EAT MORE FRUITS AND VEGETABLES     Increase physical activity     Pt would like to increase physical activity to at least 3 days per week        This is a list of the screening recommended for you and due dates:  Health Maintenance  Topic Date Due   Zoster (Shingles) Vaccine (1 of 2) Never done   COVID-19 Vaccine (5 - 2023-24 season) 04/23/2022   Yearly kidney health urinalysis for diabetes  06/09/2022   Hemoglobin A1C  08/24/2022   Hepatitis C Screening  02/27/2023*   Mammogram  09/03/2022   Flu Shot  10/21/2022   Complete foot exam   02/23/2023   Yearly kidney function blood test for diabetes  02/27/2023   Eye exam for diabetics  03/13/2023   DTaP/Tdap/Td vaccine (2 - Td or Tdap) 07/04/2023   Medicare Annual Wellness Visit  09/01/2023   Colon Cancer Screening  07/31/2030   Pneumonia Vaccine  Completed   DEXA scan (bone density measurement)  Completed   HPV Vaccine  Aged Out  *Topic was postponed. The date shown is not the original due date.    Advanced directives: no  Conditions/risks identified: none  Next appointment: Follow up in one year for your annual wellness visit 09/07/23 @ 2:00 pm by phone   Preventive Care 65 Years and Older, Female Preventive care refers to lifestyle choices and visits with your health care provider that can promote health and wellness. What does preventive care include? A yearly physical exam. This is also called an annual well check. Dental exams once or twice a year. Routine eye exams. Ask your health care provider how often you should have your eyes checked. Personal lifestyle choices, including: Daily care of your teeth and gums. Regular  physical activity. Eating a healthy diet. Avoiding tobacco and drug use. Limiting alcohol use. Practicing safe sex. Taking low-dose aspirin every day. Taking vitamin and mineral supplements as recommended by your health care provider. What happens during an annual well check? The services and screenings done by your health care provider during your annual well check will depend on your age, overall health, lifestyle risk factors, and family history of disease. Counseling  Your health care provider may ask you questions about your: Alcohol use. Tobacco use. Drug use. Emotional well-being. Home and relationship well-being. Sexual activity. Eating habits. History of falls. Memory and ability to understand (cognition). Work and work Astronomer. Reproductive health. Screening  You may have the following tests or measurements: Height, weight, and BMI. Blood pressure. Lipid and cholesterol levels. These may be checked every 5 years, or more frequently if you are over 72 years old. Skin check. Lung cancer screening. You may have this screening every year starting at age 72 if you have a 30-pack-year history of smoking and currently smoke or have quit within the past 15 years. Fecal occult blood test (FOBT) of the stool. You may have this test every year starting at age 44. Flexible sigmoidoscopy or colonoscopy. You may have a sigmoidoscopy every 5 years or a colonoscopy every 10 years starting at age 90. Hepatitis C blood test. Hepatitis B  blood test. Sexually transmitted disease (STD) testing. Diabetes screening. This is done by checking your blood sugar (glucose) after you have not eaten for a while (fasting). You may have this done every 1-3 years. Bone density scan. This is done to screen for osteoporosis. You may have this done starting at age 54. Mammogram. This may be done every 1-2 years. Talk to your health care provider about how often you should have regular mammograms. Talk  with your health care provider about your test results, treatment options, and if necessary, the need for more tests. Vaccines  Your health care provider may recommend certain vaccines, such as: Influenza vaccine. This is recommended every year. Tetanus, diphtheria, and acellular pertussis (Tdap, Td) vaccine. You may need a Td booster every 10 years. Zoster vaccine. You may need this after age 76. Pneumococcal 13-valent conjugate (PCV13) vaccine. One dose is recommended after age 33. Pneumococcal polysaccharide (PPSV23) vaccine. One dose is recommended after age 50. Talk to your health care provider about which screenings and vaccines you need and how often you need them. This information is not intended to replace advice given to you by your health care provider. Make sure you discuss any questions you have with your health care provider. Document Released: 04/04/2015 Document Revised: 11/26/2015 Document Reviewed: 01/07/2015 Elsevier Interactive Patient Education  2017 [employer].  Fall Prevention in the Home Falls can cause injuries. They can happen to people of all ages. There are many things you can do to make your home safe and to help prevent falls. What can I do on the outside of my home? Regularly fix the edges of walkways and driveways and fix any cracks. Remove anything that might make you trip as you walk through a door, such as a raised step or threshold. Trim any bushes or trees on the path to your home. Use bright outdoor lighting. Clear any walking paths of anything that might make someone trip, such as rocks or tools. Regularly check to see if handrails are loose or broken. Make sure that both sides of any steps have handrails. Any raised decks and porches should have guardrails on the edges. Have any leaves, snow, or ice cleared regularly. Use sand or salt on walking paths during winter. Clean up any spills in your garage right away. This includes oil or grease  spills. What can I do in the bathroom? Use night lights. Install grab bars by the toilet and in the tub and shower. Do not use towel bars as grab bars. Use non-skid mats or decals in the tub or shower. If you need to sit down in the shower, use a plastic, non-slip stool. Keep the floor dry. Clean up any water that spills on the floor as soon as it happens. Remove soap buildup in the tub or shower regularly. Attach bath mats securely with double-sided non-slip rug tape. Do not have throw rugs and other things on the floor that can make you trip. What can I do in the bedroom? Use night lights. Make sure that you have a light by your bed that is easy to reach. Do not use any sheets or blankets that are too big for your bed. They should not hang down onto the floor. Have a firm chair that has side arms. You can use this for support while you get dressed. Do not have throw rugs and other things on the floor that can make you trip. What can I do in the kitchen? Clean up any spills  right away. Avoid walking on wet floors. Keep items that you use a lot in easy-to-reach places. If you need to reach something above you, use a strong step stool that has a grab bar. Keep electrical cords out of the way. Do not use floor polish or wax that makes floors slippery. If you must use wax, use non-skid floor wax. Do not have throw rugs and other things on the floor that can make you trip. What can I do with my stairs? Do not leave any items on the stairs. Make sure that there are handrails on both sides of the stairs and use them. Fix handrails that are broken or loose. Make sure that handrails are as long as the stairways. Check any carpeting to make sure that it is firmly attached to the stairs. Fix any carpet that is loose or worn. Avoid having throw rugs at the top or bottom of the stairs. If you do have throw rugs, attach them to the floor with carpet tape. Make sure that you have a light switch at the  top of the stairs and the bottom of the stairs. If you do not have them, ask someone to add them for you. What else can I do to help prevent falls? Wear shoes that: Do not have high heels. Have rubber bottoms. Are comfortable and fit you well. Are closed at the toe. Do not wear sandals. If you use a stepladder: Make sure that it is fully opened. Do not climb a closed stepladder. Make sure that both sides of the stepladder are locked into place. Ask someone to hold it for you, if possible. Clearly mark and make sure that you can see: Any grab bars or handrails. First and last steps. Where the edge of each step is. Use tools that help you move around (mobility aids) if they are needed. These include: Canes. Walkers. Scooters. Crutches. Turn on the lights when you go into a dark area. Replace any light bulbs as soon as they burn out. Set up your furniture so you have a clear path. Avoid moving your furniture around. If any of your floors are uneven, fix them. If there are any pets around you, be aware of where they are. Review your medicines with your doctor. Some medicines can make you feel dizzy. This can increase your chance of falling. Ask your doctor what other things that you can do to help prevent falls. This information is not intended to replace advice given to you by your health care provider. Make sure you discuss any questions you have with your health care provider. Document Released: 01/02/2009 Document Revised: 08/14/2015 Document Reviewed: 04/12/2014 Elsevier Interactive Patient Education  2017 [employer].

## 2022-09-01 NOTE — Progress Notes (Signed)
I connected with  Gerlene Fee on 09/01/22 by a audio enabled telemedicine application and verified that I am speaking with the correct person using two identifiers.  Patient Location: Skilled Nursing Facility  Provider Location: Office/Clinic  I discussed the limitations of evaluation and management by telemedicine. The patient expressed understanding and agreed to proceed.  Subjective:   Tracey Russo is a 72 y.o. female who presents for Medicare Annual (Subsequent) preventive examination.  Review of Systems     Cardiac Risk Factors include: advanced age (>76men, >55 women);diabetes mellitus;dyslipidemia;hypertension;obesity (BMI >30kg/m2)     Objective:    Today's Vitals   09/01/22 1531 09/01/22 1602  Weight:  212 lb (96.2 kg)  Height:  5\' 6"  (1.676 m)  PainSc: 5     Body mass index is 34.22 kg/m.     09/01/2022    3:41 PM 05/27/2021    3:27 PM 07/30/2020    7:32 AM 05/26/2020    3:38 PM 10/17/2017   11:14 AM 10/03/2017   10:42 AM  Advanced Directives  Does Patient Have a Medical Advance Directive? No No No No No No  Would patient like information on creating a medical advance directive? No - Patient declined Yes (MAU/Ambulatory/Procedural Areas - Information given)  Yes (MAU/Ambulatory/Procedural Areas - Information given) No - Patient declined No - Patient declined    Current Medications (verified) Outpatient Encounter Medications as of 09/01/2022  Medication Sig   alendronate (FOSAMAX) 70 MG tablet Take 1 tablet (70 mg total) by mouth every 7 (seven) days. Take with a full glass of water on an empty stomach.   amLODipine (NORVASC) 10 MG tablet Take 1 tablet (10 mg total) by mouth daily.   calcium-vitamin D (OSCAL WITH D) 500-200 MG-UNIT tablet Take 1 tablet by mouth daily.   cetirizine (ZYRTEC) 10 MG tablet Take 1 tablet (10 mg total) by mouth daily.   citalopram (CELEXA) 20 MG tablet Take 2 tablets (40 mg total) by mouth daily.   clopidogrel (PLAVIX) 75 MG tablet  Take 1 tablet (75 mg total) by mouth daily.   Cyanocobalamin 1000 MCG TBCR Take 1 tablet by mouth daily.   dicyclomine (BENTYL) 10 MG capsule Take 10 mg by mouth 2 (two) times daily as needed.   esomeprazole (NEXIUM) 40 MG capsule Take 1 capsule (40 mg total) by mouth daily.   gabapentin (NEURONTIN) 300 MG capsule Take 1 capsule by mouth every 6 (six) hours as needed. endo   glucose blood (ACCU-CHEK AVIVA PLUS) test strip USE ONE STRIP TO CHECK GLUCOSE THREE TIMES DAILY AS  DIRECTED   hydrochlorothiazide (MICROZIDE) 12.5 MG capsule Take 1 capsule (12.5 mg total) by mouth daily.   insulin aspart (NOVOLOG) 100 UNIT/ML injection USE UP TO 60 UNITS PER DAY IN VGO PUMP AS DIRECTED   Insulin Disposable Pump (V-GO 40) KIT    isosorbide mononitrate (IMDUR) 30 MG 24 hr tablet Take 1 tablet by mouth daily.   liraglutide (VICTOZA) 18 MG/3ML SOPN Inject 1.8 mg into the skin daily.   melatonin 5 MG TABS Take 5 mg by mouth at bedtime.   metoprolol succinate (TOPROL-XL) 50 MG 24 hr tablet TAKE 1 TABLET BY MOUTH ONCE DAILY TAKE  WITH  OR  IMMEDIATLY  FOLLOWING  A  MEAL   mirabegron ER (MYRBETRIQ) 50 MG TB24 tablet Take 1 tablet (50 mg total) by mouth daily.   naproxen (NAPROSYN) 500 MG tablet Take 500 mg by mouth 2 (two) times daily with a meal.  OZEMPIC, 0.25 OR 0.5 MG/DOSE, 2 MG/1.5ML SOPN Inject into the skin. Weekly   polyethylene glycol powder (MIRALAX) 17 GM/SCOOP powder Take 1 Container by mouth once.   Probiotic Product (PROBIOTIC-10 PO) Take by mouth.   rosuvastatin (CRESTOR) 20 MG tablet Take 1 tablet (20 mg total) by mouth daily.   SYNJARDY XR 12.07-998 MG TB24 Take 1 tablet by mouth daily.   TRESIBA FLEXTOUCH 100 UNIT/ML FlexTouch Pen Inject 27 Units into the skin daily.   Vitamin D, Ergocalciferol, (DRISDOL) 1.25 MG (50000 UNIT) CAPS capsule Take 1 capsule by mouth once a week.   losartan (COZAAR) 25 MG tablet Take 1 tablet by mouth daily.   No facility-administered encounter medications on  file as of 09/01/2022.    Allergies (verified) Ace inhibitors, Corticosteroids, and Nsaids   History: Past Medical History:  Diagnosis Date   Allergy    Depression    Diabetes mellitus without complication (HCC)    GERD (gastroesophageal reflux disease)    Hyperlipidemia    Hypertension    Neuropathy    OSA (obstructive sleep apnea)    Polycythemia    Past Surgical History:  Procedure Laterality Date   CERVICAL FUSION  12/07/2000   COLONOSCOPY  2006   Dr Mechele Collin   COLONOSCOPY WITH PROPOFOL N/A 07/30/2020   Procedure: COLONOSCOPY WITH PROPOFOL;  Surgeon: [name], Boykin Nearing, MD;  Location: [facility] ENDOSCOPY;  Service: Gastroenterology;  Laterality: N/A;  IDDM   CYSTOSTOMY  09/1984   SHOULDER SURGERY Right 11/2006   tumor removed   SUBDURAL HEMATOMA EVACUATION VIA CRANIOTOMY  04/16/2007   TONSILECTOMY, ADENOIDECTOMY, BILATERAL MYRINGOTOMY AND TUBES  03/2001   VAGINAL HYSTERECTOMY  02/23/2000   Family History  Problem Relation Age of Onset   Leukemia Paternal Grandfather    Diabetes Mother    Leukemia Father    Goiter Sister    Social History   Socioeconomic History   Marital status: Married    Spouse name: Cecylia Panlilio   Number of children: Not on file   Years of education: Not on file   Highest education level: Not on file  Occupational History    Comment: retired  Tobacco Use   Smoking status: Former    Packs/day: 0.25    Years: 3.00    Additional pack years: 0.00    Total pack years: 0.75    Types: Cigarettes   Smokeless tobacco: Never  Vaping Use   Vaping Use: Never used  Substance and Sexual Activity   Alcohol use: Not Currently    Alcohol/week: 0.0 standard drinks of alcohol   Drug use: Never   Sexual activity: Yes    Partners: Male  Other Topics Concern   Not on file  Social History Narrative   Not on file   Social Determinants of Health   Financial Resource Strain: Low Risk  (09/01/2022)   Overall Financial Resource Strain (CARDIA)     Difficulty of Paying Living Expenses: Not hard at all  Food Insecurity: No Food Insecurity (09/01/2022)   Hunger Vital Sign    Worried About Running Out of Food in the Last Year: Never true    Ran Out of Food in the Last Year: Never true  Transportation Needs: No Transportation Needs (09/01/2022)   PRAPARE - Administrator, Civil Service (Medical): No    Lack of Transportation (Non-Medical): No  Physical Activity: Insufficiently Active (09/01/2022)   Exercise Vital Sign    Days of Exercise per Week: 3 days  Minutes of Exercise per Session: 30 min  Stress: Stress Concern Present (09/01/2022)   [employer] of Occupational Health - Occupational Stress Questionnaire    Feeling of Stress : To some extent  Social Connections: Moderately Isolated (09/01/2022)   Social Connection and Isolation Panel [NHANES]    Frequency of Communication with Friends and Family: More than three times a week    Frequency of Social Gatherings with Friends and Family: More than three times a week    Attends Religious Services: Never    Database administrator or Organizations: No    Attends Engineer, structural: Never    Marital Status: Married    Tobacco Counseling Counseling given: Not Answered   Clinical Intake:  Pre-visit preparation completed: Yes  Pain : 0-10 Pain Score: 5  Pain Location: Shoulder Pain Orientation: Right     Nutritional Risks: None Diabetes: Yes CBG done?: No Did pt. bring in CBG monitor from home?: No  How often do you need to have someone help you when you read instructions, pamphlets, or other written materials from your doctor or pharmacy?: 1 - Never  Diabetic?yes Nutrition Risk Assessment:  Has the patient had any N/V/D within the last 2 months?  No  Does the patient have any non-healing wounds?  No  Has the patient had any unintentional weight loss or weight gain?  No   Diabetes:  Is the patient diabetic?  Yes  If diabetic, was a CBG  obtained today?  No  Did the patient bring in their glucometer from home?  No  How often do you monitor your CBG's? continuous.   Financial Strains and Diabetes Management:  Are you having any financial strains with the device, your supplies or your medication? No .  Does the patient want to be seen by Chronic Care Management for management of their diabetes?  No  Would the patient like to be referred to a Nutritionist or for Diabetic Management?  No   Diabetic Exams:  Diabetic Eye Exam: Completed 03/12/22. O Pt has been advised about the importance in completing this exam.  Diabetic Foot Exam: Completed 02/22/22. Pt has been advised about the importance in completing this exam.    Interpreter Needed?: No  Information entered by :: Kennedy Bucker, LPN   Activities of Daily Living    09/01/2022    3:42 PM  In your present state of health, do you have any difficulty performing the following activities:  Hearing? 0  Vision? 0  Difficulty concentrating or making decisions? 0  Walking or climbing stairs? 1  Dressing or bathing? 0  Doing errands, shopping? 0  Preparing Food and eating ? N  Using the Toilet? N  In the past six months, have you accidently leaked urine? N  Do you have problems with loss of bowel control? N  Managing your Medications? N  Managing your Finances? N  Housekeeping or managing your Housekeeping? N    Patient Care Team: Duanne Limerick, MD as PCP - General (Family Medicine) Otelia Sergeant, NP (Inactive) as Nurse Practitioner (Endocrinology)  Indicate any recent Medical Services you may have received from other than [facility] providers in the past year (date may be approximate).     Assessment:   This is a routine wellness examination for [place].  Hearing/Vision screen Hearing Screening - Comments:: No aids Vision Screening - Comments:: Wears glasses- Dr.King  Dietary issues and exercise activities discussed: Current Exercise Habits: Home exercise  routine, Type of exercise: walking,  Time (Minutes): 30, Frequency (Times/Week): 3, Weekly Exercise (Minutes/Week): 90, Intensity: Mild   Goals Addressed             This Visit's Progress    DIET - EAT MORE FRUITS AND VEGETABLES         Depression Screen    09/01/2022    3:38 PM 08/30/2022    1:37 PM 04/19/2022    2:18 PM 02/26/2022    1:45 PM 05/27/2021    3:26 PM 03/12/2021    2:17 PM 01/13/2021    2:07 PM  PHQ 2/9 Scores  PHQ - 2 Score 0 4 0 1 0 0 0  PHQ- 9 Score 0 15 0 1  5 2     Fall Risk    09/01/2022    3:41 PM 08/30/2022    1:37 PM 04/19/2022    2:17 PM 02/26/2022    1:45 PM 05/27/2021    3:28 PM  Fall Risk   Falls in the past year? 0 0 0 0 0  Number falls in past yr: 0 0 0 0 0  Injury with Fall? 0 0 0 0 0  Risk for fall due to : No Fall Risks No Fall Risks;Impaired balance/gait No Fall Risks No Fall Risks No Fall Risks  Follow up Falls prevention discussed;Falls evaluation completed Falls evaluation completed;Falls prevention discussed Falls evaluation completed Falls evaluation completed Falls prevention discussed    FALL RISK PREVENTION PERTAINING TO THE HOME:  Any stairs in or around the home? Yes  If so, are there any without handrails? No  Home free of loose throw rugs in walkways, pet beds, electrical cords, etc? Yes  Adequate lighting in your home to reduce risk of falls? Yes   ASSISTIVE DEVICES UTILIZED TO PREVENT FALLS:  Life alert? No  Use of a cane, walker or w/c? Yes - cane and rollator Grab bars in the bathroom? Yes  Shower chair or bench in shower? Yes  Elevated toilet seat or a handicapped toilet? Yes    Cognitive Function:        09/01/2022    3:50 PM 05/23/2019    4:04 PM  6CIT Screen  What Year? 0 points 0 points  What month? 0 points 0 points  What time? 0 points 0 points  Count back from 20 0 points 0 points  Months in reverse 0 points 0 points  Repeat phrase 0 points 0 points  Total Score 0 points 0 points     Immunizations Immunization History  Administered Date(s) Administered   COVID-19, mRNA, vaccine(Comirnaty)12 years and older 02/26/2022   Fluad Quad(high Dose 65+) 12/07/2018, 01/02/2020, 12/09/2020, 02/26/2022   Influenza, High Dose Seasonal PF 12/06/2017   Influenza,inj,Quad PF,6+ Mos 05/09/2015, 12/22/2015, 06/02/2017   PFIZER(Purple Top)SARS-COV-2 Vaccination 05/07/2019, 06/05/2019, 02/28/2020   Pneumococcal Conjugate-13 06/02/2017   Pneumococcal Polysaccharide-23 12/07/2018   Tdap 07/03/2013    TDAP status: Up to date  Flu Vaccine status: Up to date  Pneumococcal vaccine status: Up to date  Covid-19 vaccine status: Completed vaccines  Qualifies for Shingles Vaccine? Yes   Zostavax completed No   Shingrix Completed?: No.    Education has been provided regarding the importance of this vaccine. Patient has been advised to call insurance company to determine out of pocket expense if they have not yet received this vaccine. Advised may also receive vaccine at local pharmacy or Health Dept. Verbalized acceptance and understanding.  Screening Tests Health Maintenance  Topic Date Due   Zoster Vaccines- Shingrix (1  of 2) Never done   COVID-19 Vaccine (5 - 2023-24 season) 04/23/2022   Diabetic kidney evaluation - Urine ACR  06/09/2022   HEMOGLOBIN A1C  08/24/2022   Hepatitis C Screening  02/27/2023 (Originally 07/10/1968)   MAMMOGRAM  09/03/2022   INFLUENZA VACCINE  10/21/2022   FOOT EXAM  02/23/2023   Diabetic kidney evaluation - eGFR measurement  02/27/2023   OPHTHALMOLOGY EXAM  03/13/2023   DTaP/Tdap/Td (2 - Td or Tdap) 07/04/2023   Medicare Annual Wellness (AWV)  09/01/2023   Colonoscopy  07/31/2030   Pneumonia Vaccine 34+ Years old  Completed   DEXA SCAN  Completed   HPV VACCINES  Aged Out    Health Maintenance  Health Maintenance Due  Topic Date Due   Zoster Vaccines- Shingrix (1 of 2) Never done   COVID-19 Vaccine (5 - 2023-24 season) 04/23/2022   Diabetic  kidney evaluation - Urine ACR  06/09/2022   HEMOGLOBIN A1C  08/24/2022    Colorectal cancer screening: Type of screening: Colonoscopy. Completed 07/30/20. Repeat every 10 years  Mammogram status: Completed 09/02/21. Repeat every year  Bone Density status: Completed 09/02/21. Results reflect: Bone density results: OSTEOPOROSIS. Repeat every 2 years.  Lung Cancer Screening: (Low Dose CT Chest recommended if Age 83-80 years, 30 pack-year currently smoking OR have quit w/in 15years.) does not qualify.    Additional Screening:  Hepatitis C Screening: does qualify; Completed no  Vision Screening: Recommended annual ophthalmology exams for early detection of glaucoma and other disorders of the eye. Is the patient up to date with their annual eye exam?  Yes  Who is the provider or what is the name of the office in which the patient attends annual eye exams? Dr.King If pt is not established with a provider, would they like to be referred to a provider to establish care? No .   Dental Screening: Recommended annual dental exams for proper oral hygiene  Community Resource Referral / Chronic Care Management: CRR required this visit?  No   CCM required this visit?  No      Plan:     I have personally reviewed and noted the following in the patient's chart:   Medical and social history Use of alcohol, tobacco or illicit drugs  Current medications and supplements including opioid prescriptions. Patient is not currently taking opioid prescriptions. Functional ability and status Nutritional status Physical activity Advanced directives List of other physicians Hospitalizations, surgeries, and ER visits in previous 12 months Vitals Screenings to include cognitive, depression, and falls Referrals and appointments  In addition, I have reviewed and discussed with patient certain preventive protocols, quality metrics, and best practice recommendations. A written personalized care plan for  preventive services as well as general preventive health recommendations were provided to patient.     Hal Hope, LPN   [id number]/[id number]   Nurse Notes: none

## 2022-09-08 DIAGNOSIS — E669 Obesity, unspecified: Secondary | ICD-10-CM | POA: Diagnosis not present

## 2022-09-08 DIAGNOSIS — M19011 Primary osteoarthritis, right shoulder: Secondary | ICD-10-CM | POA: Diagnosis not present

## 2022-09-08 DIAGNOSIS — E1129 Type 2 diabetes mellitus with other diabetic kidney complication: Secondary | ICD-10-CM | POA: Diagnosis not present

## 2022-09-08 DIAGNOSIS — Z794 Long term (current) use of insulin: Secondary | ICD-10-CM | POA: Diagnosis not present

## 2022-09-08 DIAGNOSIS — M25311 Other instability, right shoulder: Secondary | ICD-10-CM | POA: Diagnosis not present

## 2022-09-08 DIAGNOSIS — R809 Proteinuria, unspecified: Secondary | ICD-10-CM | POA: Diagnosis not present

## 2022-09-08 DIAGNOSIS — M25511 Pain in right shoulder: Secondary | ICD-10-CM | POA: Diagnosis not present

## 2022-09-09 ENCOUNTER — Other Ambulatory Visit: Payer: Self-pay | Admitting: Orthopedic Surgery

## 2022-09-09 DIAGNOSIS — M25511 Pain in right shoulder: Secondary | ICD-10-CM

## 2022-09-09 DIAGNOSIS — M25311 Other instability, right shoulder: Secondary | ICD-10-CM

## 2022-09-09 DIAGNOSIS — M19011 Primary osteoarthritis, right shoulder: Secondary | ICD-10-CM

## 2022-09-15 DIAGNOSIS — G4733 Obstructive sleep apnea (adult) (pediatric): Secondary | ICD-10-CM | POA: Diagnosis not present

## 2022-10-05 ENCOUNTER — Ambulatory Visit
Admission: RE | Admit: 2022-10-05 | Discharge: 2022-10-05 | Disposition: A | Discharge status: Home / Self Care | Payer: Medicare PPO | Source: Ambulatory Visit | Attending: Orthopedic Surgery | Admitting: Orthopedic Surgery

## 2022-10-05 DIAGNOSIS — M19011 Primary osteoarthritis, right shoulder: Secondary | ICD-10-CM

## 2022-10-05 DIAGNOSIS — M25311 Other instability, right shoulder: Secondary | ICD-10-CM

## 2022-10-05 DIAGNOSIS — S46011A Strain of muscle(s) and tendon(s) of the rotator cuff of right shoulder, initial encounter: Secondary | ICD-10-CM | POA: Diagnosis not present

## 2022-10-05 DIAGNOSIS — M25511 Pain in right shoulder: Secondary | ICD-10-CM | POA: Diagnosis not present

## 2022-10-06 DIAGNOSIS — M12811 Other specific arthropathies, not elsewhere classified, right shoulder: Secondary | ICD-10-CM | POA: Diagnosis not present

## 2022-11-11 ENCOUNTER — Other Ambulatory Visit: Payer: Self-pay | Admitting: Orthopedic Surgery

## 2022-11-11 ENCOUNTER — Ambulatory Visit
Admission: RE | Admit: 2022-11-11 | Discharge: 2022-11-11 | Disposition: A | Discharge status: Home / Self Care | Payer: Medicare PPO | Source: Ambulatory Visit | Attending: Orthopedic Surgery | Admitting: Orthopedic Surgery

## 2022-11-11 DIAGNOSIS — M12811 Other specific arthropathies, not elsewhere classified, right shoulder: Secondary | ICD-10-CM | POA: Diagnosis not present

## 2022-11-11 DIAGNOSIS — M25511 Pain in right shoulder: Secondary | ICD-10-CM | POA: Diagnosis not present

## 2022-11-11 DIAGNOSIS — M19011 Primary osteoarthritis, right shoulder: Secondary | ICD-10-CM | POA: Diagnosis not present

## 2022-11-18 ENCOUNTER — Other Ambulatory Visit: Payer: Medicare PPO

## 2022-11-23 ENCOUNTER — Encounter: Payer: Self-pay | Admitting: Family Medicine

## 2022-11-23 ENCOUNTER — Encounter
Admission: RE | Admit: 2022-11-23 | Discharge: 2022-11-23 | Disposition: A | Discharge status: Home / Self Care | Payer: Medicare PPO | Source: Ambulatory Visit | Attending: Orthopedic Surgery | Admitting: Orthopedic Surgery

## 2022-11-23 ENCOUNTER — Other Ambulatory Visit: Payer: Self-pay

## 2022-11-23 ENCOUNTER — Encounter: Payer: Self-pay | Admitting: Orthopedic Surgery

## 2022-11-23 VITALS — BP 120/86 | HR 82 | Resp 16 | Ht 66.0 in | Wt 198.3 lb

## 2022-11-23 DIAGNOSIS — Z01818 Encounter for other preprocedural examination: Secondary | ICD-10-CM | POA: Insufficient documentation

## 2022-11-23 DIAGNOSIS — N3946 Mixed incontinence: Secondary | ICD-10-CM | POA: Diagnosis not present

## 2022-11-23 DIAGNOSIS — R8290 Unspecified abnormal findings in urine: Secondary | ICD-10-CM | POA: Insufficient documentation

## 2022-11-23 DIAGNOSIS — E119 Type 2 diabetes mellitus without complications: Secondary | ICD-10-CM | POA: Diagnosis not present

## 2022-11-23 DIAGNOSIS — R8271 Bacteriuria: Secondary | ICD-10-CM | POA: Diagnosis not present

## 2022-11-23 DIAGNOSIS — Z794 Long term (current) use of insulin: Secondary | ICD-10-CM | POA: Diagnosis not present

## 2022-11-23 DIAGNOSIS — Z01812 Encounter for preprocedural laboratory examination: Secondary | ICD-10-CM

## 2022-11-23 HISTORY — DX: Hypertensive heart disease without heart failure: I11.9

## 2022-11-23 HISTORY — DX: Other complications of anesthesia, initial encounter: T88.59XA

## 2022-11-23 HISTORY — DX: Type 2 diabetes mellitus with diabetic neuropathy, unspecified: E11.40

## 2022-11-23 HISTORY — DX: Unspecified osteoarthritis, unspecified site: M19.90

## 2022-11-23 HISTORY — DX: Cerebral infarction, unspecified: I63.9

## 2022-11-23 HISTORY — DX: Personal history of urinary calculi: Z87.442

## 2022-11-23 HISTORY — DX: Other specific arthropathies, not elsewhere classified, right shoulder: M12.811

## 2022-11-23 HISTORY — DX: Occlusion and stenosis of bilateral carotid arteries: I65.23

## 2022-11-23 HISTORY — DX: Family history of other specified conditions: Z84.89

## 2022-11-23 HISTORY — DX: Malignant (primary) neoplasm, unspecified: C80.1

## 2022-11-23 HISTORY — DX: Vitamin D deficiency, unspecified: E55.9

## 2022-11-23 LAB — COMPREHENSIVE METABOLIC PANEL
ALT: 14 U/L (ref 0–44)
AST: 15 U/L (ref 15–41)
Albumin: 3.7 g/dL (ref 3.5–5.0)
Alkaline Phosphatase: 60 U/L (ref 38–126)
Anion gap: 12 (ref 5–15)
BUN: 10 mg/dL (ref 8–23)
CO2: 22 mmol/L (ref 22–32)
Calcium: 9.2 mg/dL (ref 8.9–10.3)
Chloride: 102 mmol/L (ref 98–111)
Creatinine, Ser: 0.85 mg/dL (ref 0.44–1.00)
GFR, Estimated: >60 mL/min (ref >60)
Glucose, Bld: 194 mg/dL — ABNORMAL HIGH (ref 70–99)
Potassium: 4 mmol/L (ref 3.5–5.1)
Sodium: 136 mmol/L (ref 135–145)
Total Bilirubin: 0.8 mg/dL (ref 0.3–1.2)
Total Protein: 7.8 g/dL (ref 6.5–8.1)

## 2022-11-23 LAB — URINALYSIS, ROUTINE W REFLEX MICROSCOPIC
Bilirubin Urine: NEGATIVE
Glucose, UA: >=500 mg/dL — AB
Hgb urine dipstick: NEGATIVE
Ketones, ur: NEGATIVE mg/dL
Leukocytes,Ua: NEGATIVE
Nitrite: POSITIVE — AB
Protein, ur: NEGATIVE mg/dL
Specific Gravity, Urine: 1.011 (ref 1.005–1.030)
pH: 6 (ref 5.0–8.0)

## 2022-11-23 LAB — CBC WITH DIFFERENTIAL/PLATELET
Abs Immature Granulocytes: 0.03 10*3/uL (ref 0.00–0.07)
Basophils Absolute: 0.1 10*3/uL (ref 0.0–0.1)
Basophils Relative: 1 %
Eosinophils Absolute: 0.3 10*3/uL (ref 0.0–0.5)
Eosinophils Relative: 3 %
HCT: 46.1 % — ABNORMAL HIGH (ref 36.0–46.0)
Hemoglobin: 15.3 g/dL — ABNORMAL HIGH (ref 12.0–15.0)
Immature Granulocytes: 0 %
Lymphocytes Relative: 26 %
Lymphs Abs: 2 10*3/uL (ref 0.7–4.0)
MCH: 28.8 pg (ref 26.0–34.0)
MCHC: 33.2 g/dL (ref 30.0–36.0)
MCV: 86.7 fL (ref 80.0–100.0)
Monocytes Absolute: 0.7 10*3/uL (ref 0.1–1.0)
Monocytes Relative: 10 %
Neutro Abs: 4.6 10*3/uL (ref 1.7–7.7)
Neutrophils Relative %: 60 %
Platelets: 259 10*3/uL (ref 150–400)
RBC: 5.32 MIL/uL — ABNORMAL HIGH (ref 3.87–5.11)
RDW: 12.6 % (ref 11.5–15.5)
WBC: 7.7 10*3/uL (ref 4.0–10.5)
nRBC: 0 % (ref 0.0–0.2)

## 2022-11-23 LAB — TYPE AND SCREEN
ABO/RH(D): O POS
Antibody Screen: NEGATIVE

## 2022-11-23 LAB — HEMOGLOBIN A1C
Hgb A1c MFr Bld: 7.8 % — ABNORMAL HIGH (ref 4.8–5.6)
Mean Plasma Glucose: 177.16 mg/dL

## 2022-11-23 LAB — SURGICAL PCR SCREEN
MRSA, PCR: NEGATIVE
Staphylococcus aureus: NEGATIVE

## 2022-11-23 NOTE — Pre-Procedure Instructions (Signed)
Diabetic Coordinator made aware that patient is Type 2 diabetic with a Vgo 40 patch, patient is  advised to contact her diabetic Md and make him aware of her surgery, per DM nurse she is to remove her patch right before her surgery .

## 2022-11-23 NOTE — Patient Instructions (Addendum)
Your procedure is scheduled on: 11/30/22 - Tuesday Report to the Registration Desk on the 1st floor of the [facility]. To find out your arrival time, please call [phone] between 1PM - 3PM on: 11/29/22 - Monday If your arrival time is 6:00 am, do not arrive before that time as the [facility] entrance doors do not open until 6:00 am.  REMEMBER: Instructions that are not followed completely may result in serious medical risk, up to and including death; or upon the discretion of your surgeon and anesthesiologist your surgery may need to be rescheduled.  Do not eat food after midnight the night before surgery.  No gum chewing or hard candies.  You may drink water up to 2 hours before you are scheduled to arrive for your surgery. Do not drink anything within 2 hours of your scheduled arrival time.  In addition, your doctor has ordered for you to drink the provided:  Gatorade G2 Drinking this carbohydrate drink up to two hours before surgery helps to reduce insulin resistance and improve patient outcomes. Please complete drinking 2 hours before scheduled arrival time.  One week prior to surgery: Stop Anti-inflammatories (NSAIDS) such as Advil, Aleve, Ibuprofen, Motrin, Naproxen, Naprosyn and Aspirin based products such as Excedrin, Goody's Powder, BC Powder. You may however, continue to take Tylenol if needed for pain up until the day of surgery.  Stop ANY OVER THE COUNTER supplements until after surgery.  Continue taking all prescribed medications with the exception of the following:  clopidogrel (PLAVIX) stop beginning 11/25/22. liraglutide (VICTOZA) hold 7 days before your surgery SYNJARDY XR stop beginning 11/27/22. 4.   TRESIBA FLEXTOUCH inject 1/2 of your bedtime dose on the night before your surgery.    TAKE ONLY THESE MEDICATIONS THE MORNING OF SURGERY WITH A SIP OF WATER:  esomeprazole (NEXIUM) - (take one the night before and one on the morning of surgery - helps to  prevent nausea after surgery.) amLODipine (NORVASC)  citalopram (CELEXA)  metoprolol succinate  mirabegron ER (MYRBETRIQ)  rosuvastatin (CRESTOR)    No Alcohol for 24 hours before or after surgery.  No Smoking including e-cigarettes for 24 hours before surgery.  No chewable tobacco products for at least 6 hours before surgery.  No nicotine patches on the day of surgery.  Do not use any "recreational" drugs for at least a week (preferably 2 weeks) before your surgery.  Please be advised that the combination of cocaine and anesthesia may have negative outcomes, up to and including death. If you test positive for cocaine, your surgery will be cancelled.  On the morning of surgery brush your teeth with toothpaste and water, you may rinse your mouth with mouthwash if you wish. Do not swallow any toothpaste or mouthwash.  Use CHG Soap or wipes as directed on instruction sheet.  Do not wear jewelry, make-up, hairpins, clips or nail polish.  Do not wear lotions, powders, or perfumes.   Do not shave body hair from the neck down 48 hours before surgery.  Contact lenses, hearing aids and dentures may not be worn into surgery.  Do not bring valuables to the hospital. [facility] is not responsible for any missing/lost belongings or valuables.   Total Shoulder Arthroplasty:  use Benzoyl Peroxide 5% Gel as directed on instruction sheet.  Bring your C-PAP to the hospital in case you may have to spend the night.   Notify your doctor if there is any change in your medical condition (cold, fever, infection).  Wear comfortable  clothing (specific to your surgery type) to the hospital.  After surgery, you can help prevent lung complications by doing breathing exercises.  Take deep breaths and cough every 1-2 hours. Your doctor may order a device called an Incentive Spirometer to help you take deep breaths. When coughing or sneezing, hold a pillow firmly against your incision with both hands.  This is called "splinting." Doing this helps protect your incision. It also decreases belly discomfort.  If you are being admitted to the hospital overnight, leave your suitcase in the car. After surgery it may be brought to your room.  In case of increased patient census, it may be necessary for you, the patient, to continue your postoperative care in the Same Day Surgery department.  If you are being discharged the day of surgery, you will not be allowed to drive home. You will need a responsible individual to drive you home and stay with you for 24 hours after surgery.   If you are taking public transportation, you will need to have a responsible individual with you.  Please call the Pre-admissions Testing Dept. at [phone] if you have any questions about these instructions.  Surgery Visitation Policy:  Patients having surgery or a procedure may have two visitors.  Children under the age of 64 must have an adult with them who is not the patient.  Inpatient Visitation:    Visiting hours are 7 a.m. to 8 p.m. Up to four visitors are allowed at one time in a patient room. The visitors may rotate out with other people during the day.  One visitor age 32 or older may stay with the patient overnight and must be in the room by 8 p.m.     Pre-operative 5 CHG Bath Instructions   You can play a key role in reducing the risk of infection after surgery. Your skin needs to be as free of germs as possible. You can reduce the number of germs on your skin by washing with CHG (chlorhexidine gluconate) soap before surgery. CHG is an antiseptic soap that kills germs and continues to kill germs even after washing.   DO NOT use if you have an allergy to chlorhexidine/CHG or antibacterial soaps. If your skin becomes reddened or irritated, stop using the CHG and notify one of our RNs at [phone].   Please shower with the CHG soap starting 4 days before surgery using the following schedule:  09/06 - 09/09.    Please keep in mind the following:  DO NOT shave, including legs and underarms, starting the day of your first shower.   You may shave your face at any point before/day of surgery.  Place clean sheets on your bed the day you start using CHG soap. Use a clean washcloth (not used since being washed) for each shower. DO NOT sleep with pets once you start using the CHG.   CHG Shower Instructions:  If you choose to wash your hair and private area, wash first with your normal shampoo/soap.  After you use shampoo/soap, rinse your hair and body thoroughly to remove shampoo/soap residue.  Turn the water OFF and apply about 3 tablespoons (45 ml) of CHG soap to a CLEAN washcloth.  Apply CHG soap ONLY FROM YOUR NECK DOWN TO YOUR TOES (washing for 3-5 minutes)  DO NOT use CHG soap on face, private areas, open wounds, or sores.  Pay special attention to the area where your surgery is being performed.  If you are having back surgery,  having someone wash your back for you may be helpful. Wait 2 minutes after CHG soap is applied, then you may rinse off the CHG soap.  Pat dry with a clean towel  Put on clean clothes/pajamas   If you choose to wear lotion, please use ONLY the CHG-compatible lotions on the back of this paper.     Additional instructions for the day of surgery: DO NOT APPLY any lotions, deodorants, cologne, or perfumes.   Put on clean/comfortable clothes.  Brush your teeth.  Ask your nurse before applying any prescription medications to the skin.      CHG Compatible Lotions   Aveeno Moisturizing lotion  Cetaphil Moisturizing Cream  Cetaphil Moisturizing Lotion  Clairol Herbal Essence Moisturizing Lotion, Dry Skin  Clairol Herbal Essence Moisturizing Lotion, Extra Dry Skin  Clairol Herbal Essence Moisturizing Lotion, Normal Skin  Curel Age Defying Therapeutic Moisturizing Lotion with Alpha Hydroxy  Curel Extreme Care Body Lotion  Curel Soothing Hands  Moisturizing Hand Lotion  Curel Therapeutic Moisturizing Cream, Fragrance-Free  Curel Therapeutic Moisturizing Lotion, Fragrance-Free  Curel Therapeutic Moisturizing Lotion, Original Formula  Eucerin Daily Replenishing Lotion  Eucerin Dry Skin Therapy Plus Alpha Hydroxy Crme  Eucerin Dry Skin Therapy Plus Alpha Hydroxy Lotion  Eucerin Original Crme  Eucerin Original Lotion  Eucerin Plus Crme Eucerin Plus Lotion  Eucerin TriLipid Replenishing Lotion  Keri Anti-Bacterial Hand Lotion  Keri Deep Conditioning Original Lotion Dry Skin Formula Softly Scented  Keri Deep Conditioning Original Lotion, Fragrance Free Sensitive Skin Formula  Keri Lotion Fast Absorbing Fragrance Free Sensitive Skin Formula  Keri Lotion Fast Absorbing Softly Scented Dry Skin Formula  Keri Original Lotion  Keri Skin Renewal Lotion Keri Silky Smooth Lotion  Keri Silky Smooth Sensitive Skin Lotion  Nivea Body Creamy Conditioning Oil  Nivea Body Extra Enriched Lotion  Nivea Body Original Lotion  Nivea Body Sheer Moisturizing Lotion Nivea Crme  Nivea Skin Firming Lotion  NutraDerm 30 Skin Lotion  NutraDerm Skin Lotion  NutraDerm Therapeutic Skin Cream  NutraDerm Therapeutic Skin Lotion  ProShield Protective Hand Cream  Provon moisturizing lotion Preparing for Total Shoulder Arthroplasty  Before surgery, you can play an important role by reducing the number of germs on your skin by using the following products:  Benzoyl Peroxide Gel  o Reduces the number of germs present on the skin  o Applied twice a day to shoulder area starting two days before surgery  Chlorhexidine Gluconate (CHG) Soap  o An antiseptic cleaner that kills germs and bonds with the skin to continue killing germs even after washing  o Used for showering the night before surgery and morning of surgery  BENZOYL PEROXIDE 5% GEL  Please do not use if you have an allergy to benzoyl peroxide. If your skin becomes reddened/irritated stop  using the benzoyl peroxide.  Starting two days before surgery, apply as follows:  1. Apply benzoyl peroxide in the morning and at night. Apply after taking a shower. If you are not taking a shower, clean entire shoulder front, back, and side along with the armpit with a clean wet washcloth.  2. Place a quarter-sized dollop on your shoulder and rub in thoroughly, making sure to cover the front, back, and side of your shoulder, along with the armpit.  2 days before _on  09/08_ X  AM __X_ PM 1 day before _on  09/09  _X AM ___X_ PM  3. Do this twice a day for two days. (Last application is the night before surgery, AFTER using  the CHG soap).  4. Do NOT apply benzoyl peroxide gel on the day of surgery.  How to Use an Incentive Spirometer  An incentive spirometer is a tool that measures how well you are filling your lungs with each breath. Learning to take long, deep breaths using this tool can help you keep your lungs clear and active. This may help to reverse or lessen your chance of developing breathing (pulmonary) problems, especially infection. You may be asked to use a spirometer: After a surgery. If you have a lung problem or a history of smoking. After a long period of time when you have been unable to move or be active. If the spirometer includes an indicator to show the highest number that you have reached, your health care provider or respiratory therapist will help you set a goal. Keep a log of your progress as told by your health care provider. What are the risks? Breathing too quickly may cause dizziness or cause you to pass out. Take your time so you do not get dizzy or light-headed. If you are in pain, you may need to take pain medicine before doing incentive spirometry. It is harder to take a deep breath if you are having pain. How to use your incentive spirometer  Sit up on the edge of your bed or on a chair. Hold the incentive spirometer so that it is in an upright  position. Before you use the spirometer, breathe out normally. Place the mouthpiece in your mouth. Make sure your lips are closed tightly around it. Breathe in slowly and as deeply as you can through your mouth, causing the piston or the ball to rise toward the top of the chamber. Hold your breath for 3-5 seconds, or for as long as possible. If the spirometer includes a coach indicator, use this to guide you in breathing. Slow down your breathing if the indicator goes above the marked areas. Remove the mouthpiece from your mouth and breathe out normally. The piston or ball will return to the bottom of the chamber. Rest for a few seconds, then repeat the steps 10 or more times. Take your time and take a few normal breaths between deep breaths so that you do not get dizzy or light-headed. Do this every 1-2 hours when you are awake. If the spirometer includes a goal marker to show the highest number you have reached (best effort), use this as a goal to work toward during each repetition. After each set of 10 deep breaths, cough a few times. This will help to make sure that your lungs are clear. If you have an incision on your chest or abdomen from surgery, place a pillow or a rolled-up towel firmly against the incision when you cough. This can help to reduce pain while taking deep breaths and coughing. General tips When you are able to get out of bed: Walk around often. Continue to take deep breaths and cough in order to clear your lungs. Keep using the incentive spirometer until your health care provider says it is okay to stop using it. If you have been in the hospital, you may be told to keep using the spirometer at home. Contact a health care provider if: You are having difficulty using the spirometer. You have trouble using the spirometer as often as instructed. Your pain medicine is not giving enough relief for you to use the spirometer as told. You have a fever. Get help right away  if: You develop shortness of breath.  You develop a cough with bloody mucus from the lungs. You have fluid or blood coming from an incision site after you cough. Summary An incentive spirometer is a tool that can help you learn to take long, deep breaths to keep your lungs clear and active. You may be asked to use a spirometer after a surgery, if you have a lung problem or a history of smoking, or if you have been inactive for a long period of time. Use your incentive spirometer as instructed every 1-2 hours while you are awake. If you have an incision on your chest or abdomen, place a pillow or a rolled-up towel firmly against your incision when you cough. This will help to reduce pain. Get help right away if you have shortness of breath, you cough up bloody mucus, or blood comes from your incision when you cough. This information is not intended to replace advice given to you by your health care provider. Make sure you discuss any questions you have with your health care provider. Document Revised: 05/28/2019 Document Reviewed: 05/28/2019 Elsevier Patient Education  2023 Elsevier Inc.  POLAR CARE INFORMATION  MassAdvertisement.it  How to use [facility] Therapy System?  YouTube   ShippingScam.co.uk  OPERATING INSTRUCTIONS  Start the product With dry hands, connect the transformer to the electrical connection located on the top of the cooler. Next, plug the transformer into an appropriate electrical outlet. The unit will automatically start running at this point.  To stop the pump, disconnect electrical power.  Unplug to stop the product when not in use. Unplugging the Polar Care unit turns it off. Always unplug immediately after use. Never leave it plugged in while unattended. Remove pad.    FIRST ADD WATER TO FILL LINE, THEN ICE---Replace ice when existing ice is almost melted  1 Discuss Treatment with your Licensed Health Care Practitioner and Use  Only as Prescribed 2 Apply Insulation Barrier & Cold Therapy Pad 3 Check for Moisture 4 Inspect Skin Regularly  Tips and Trouble Shooting Usage Tips 1. Use cubed or chunked ice for optimal performance. 2. It is recommended to drain the Pad between uses. To drain the pad, hold the Pad upright with the hose pointed toward the ground. Depress the black plunger and allow water to drain out. 3. You may disconnect the Pad from the unit without removing the pad from the affected area by depressing the silver tabs on the hose coupling and gently pulling the hoses apart. The Pad and unit will seal itself and will not leak. Note: Some dripping during release is normal. 4. DO NOT RUN PUMP WITHOUT WATER! The pump in this unit is designed to run with water. Running the unit without water will cause permanent damage to the pump. 5. Unplug unit before removing lid.  TROUBLESHOOTING GUIDE Pump not running, Water not flowing to the pad, Pad is not getting cold 1. Make sure the transformer is plugged into the wall outlet. 2. Confirm that the ice and water are filled to the indicated levels. 3. Make sure there are no kinks in the pad. 4. Gently pull on the blue tube to make sure the tube/pad junction is straight. 5. Remove the pad from the treatment site and ll it while the pad is lying at; then reapply. 6. Confirm that the pad couplings are securely attached to the unit. Listen for the double clicks (Figure 1) to confirm the pad couplings are securely attached.  Leaks    Note: Some  condensation on the lines, controller, and pads is unavoidable, especially in warmer climates. 1. If using a [employer] Polar Care Cold Therapy unit with a detachable Cold Therapy Pad, and a leak exists (other than condensation on the lines) disconnect the pad couplings. Make sure the silver tabs on the couplings are depressed before reconnecting the pad to the pump hose; then confirm both sides of the coupling are properly clicked in. 2.  If the coupling continues to leak or a leak is detected in the pad itself, stop using it and call [employer] Customer Care at [phone].  Cleaning After use, empty and dry the unit with a soft cloth. Warm water and mild detergent may be used occasionally to clean the pump and tubes.  WARNING: The Polar Care Cube can be cold enough to cause serious injury, including full skin necrosis. Follow these Operating Instructions, and carefully read the Product Insert (see pouch on side of unit) and the Cold Therapy Pad Fitting Instructions (provided with each Cold Therapy Pad) prior to use.

## 2022-11-23 NOTE — Pre-Procedure Instructions (Signed)
Diabetic Coordinator - Pamala Hurry

## 2022-11-25 ENCOUNTER — Telehealth: Payer: Self-pay | Admitting: Urgent Care

## 2022-11-25 DIAGNOSIS — B9620 Unspecified Escherichia coli [E. coli] as the cause of diseases classified elsewhere: Secondary | ICD-10-CM

## 2022-11-25 DIAGNOSIS — Z01812 Encounter for preprocedural laboratory examination: Secondary | ICD-10-CM

## 2022-11-25 LAB — URINE CULTURE: Culture: >=100000 — AB

## 2022-11-25 MED ORDER — SULFAMETHOXAZOLE-TRIMETHOPRIM 800-160 MG PO TABS
1.0000 | ORAL_TABLET | Freq: Two times a day (BID) | ORAL | 0 refills | Status: AC
Start: 2022-11-25 — End: 2022-11-28

## 2022-11-25 NOTE — Progress Notes (Signed)
  [facility] Perioperative Services: Pre-Admission/Anesthesia Testing  Abnormal Lab Notification and Treatment Plan of Care   Date: 11/25/22  Name: Tracey Russo MRN:   [id number]  Re: Abnormal labs noted during PAT appointment   Notified:  Provider Name Provider Role Notification Mode  Signa Kell, MD Orthopedics Routed and/or faxed via [facility]   Abnormal Lab Value(s):   Lab Results  Component Value Date   COLORURINE YELLOW (A) 11/23/2022   APPEARANCEUR HAZY (A) 11/23/2022   LABSPEC 1.011 11/23/2022   PHURINE 6.0 11/23/2022   GLUCOSEU >=500 (A) 11/23/2022   HGBUR NEGATIVE 11/23/2022   BILIRUBINUR NEGATIVE 11/23/2022   KETONESUR NEGATIVE 11/23/2022   PROTEINUR NEGATIVE 11/23/2022   UROBILINOGEN 0.2 05/06/2020   NITRITE POSITIVE (A) 11/23/2022   LEUKOCYTESUR NEGATIVE 11/23/2022   EPIU 0-5 11/23/2022   WBCU 0-5 11/23/2022   RBCU 0-5 11/23/2022   BACTERIA FEW (A) 11/23/2022   CULT >=100,000 COLONIES/mL ESCHERICHIA COLI (A) 11/23/2022    Clinical Information and Notes:  Patient is scheduled for an elective RIGHT REVERSE SHOULDER ARTHROPLASTY, BICEPS TENODESIS on 11/30/2022.    UA performed in PAT consistent with/concerning for infection.  No leukocytosis noted on CBC; WBC 7.7 Renal function: Estimated Creatinine Clearance: 65.5 mL/min (by C-G formula based on SCr of 0.85 mg/dL). Urine C&S added to assess for pathogen  Impression and Plan:  Tracey Russo with a UA that was (+) for infection; reflex culture sent. Contacted patient to discuss. Patient reporting that she is experiencing minor LUTS. Patient with surgery scheduled soon. In efforts to avoid delaying patient's procedure, or have her experience any potentially significant perioperative complications related to the aforementioned, I would like to proceed with treatment for urinary tract infection.  Allergies reviewed. Culture report also reviewed to ensure culture appropriate coverage is  being provided. Will treat with a 3 day course of SMZ-TMP DS Patient encouraged to complete the entire course of antibiotics even if she begins to feel better. She was advised that if culture demonstrates resistance to the prescribed antibiotic, she will be contacted and advised of the need to change the antibiotic being used to treat her infection.   Meds ordered this encounter  Medications   sulfamethoxazole-trimethoprim (BACTRIM DS) 800-160 MG tablet    Sig: Take 1 tablet by mouth 2 (two) times daily for 3 days. Increase water intake while taking this medication.    Dispense:  6 tablet    Refill:  0   Patient encouraged to increase her fluid intake as much as possible. Discussed that water is always best to flush the urinary tract.   May use Tylenol as needed for pain/fever should she experience these symptoms.   Patient instructed to call surgeon's office or PAT with any questions or concerns related to the above outlined course of treatment. Additionally, she was instructed to call if she feels like she is getting worse overall while on treatment. Results and treatment plan of care forwarded to primary attending surgeon to make them aware.   Encounter Diagnoses  Name Primary?   Pre-operative laboratory examination Yes   E. coli UTI (urinary tract infection)    Quentin Mulling, MSN, APRN, FNP-C, CEN [facility]  Peri-operative Services Nurse Practitioner Phone: [phone] Fax: [phone] 11/25/22 4:10 PM  NOTE: This note has been prepared using Dragon dictation software. Despite my best ability to proofread, there is always the potential that unintentional transcriptional errors may still occur from this process.

## 2022-11-29 DIAGNOSIS — M12811 Other specific arthropathies, not elsewhere classified, right shoulder: Secondary | ICD-10-CM | POA: Diagnosis not present

## 2022-11-29 MED ORDER — SODIUM CHLORIDE 0.9 % IV SOLN: INTRAVENOUS | Status: DC

## 2022-11-29 MED ORDER — ORAL CARE MOUTH RINSE: 15.0000 mL | Freq: Once | OROMUCOSAL | Status: AC

## 2022-11-29 MED ORDER — CEFAZOLIN SODIUM-DEXTROSE 2-4 GM/100ML-% IV SOLN
2.0000 g | INTRAVENOUS | Status: AC
  Administered 2022-11-30: 2 g via INTRAVENOUS

## 2022-11-29 MED ORDER — CHLORHEXIDINE GLUCONATE 0.12 % MT SOLN
15.0000 mL | Freq: Once | OROMUCOSAL | Status: AC
  Administered 2022-11-30: 15 mL via OROMUCOSAL

## 2022-11-29 MED ORDER — TRANEXAMIC ACID-NACL 1000-0.7 MG/100ML-% IV SOLN
1000.0000 mg | INTRAVENOUS | Status: AC
  Administered 2022-11-30: 1000 mg via INTRAVENOUS

## 2022-11-30 ENCOUNTER — Encounter
Admission: RE | Disposition: A | Discharge status: Home / Self Care | Payer: Self-pay | Source: Home / Self Care | Attending: Orthopedic Surgery

## 2022-11-30 ENCOUNTER — Other Ambulatory Visit: Payer: Self-pay

## 2022-11-30 ENCOUNTER — Ambulatory Visit: Payer: Medicare PPO | Admitting: Urgent Care

## 2022-11-30 ENCOUNTER — Ambulatory Visit: Payer: Medicare PPO

## 2022-11-30 ENCOUNTER — Encounter: Payer: Self-pay | Admitting: Orthopedic Surgery

## 2022-11-30 ENCOUNTER — Observation Stay
Admission: RE | Admit: 2022-11-30 | Discharge: 2022-12-01 | Disposition: A | Discharge status: Home / Self Care | Payer: Medicare PPO | Attending: Orthopedic Surgery | Admitting: Orthopedic Surgery

## 2022-11-30 DIAGNOSIS — Z85828 Personal history of other malignant neoplasm of skin: Secondary | ICD-10-CM | POA: Diagnosis not present

## 2022-11-30 DIAGNOSIS — E119 Type 2 diabetes mellitus without complications: Secondary | ICD-10-CM | POA: Diagnosis not present

## 2022-11-30 DIAGNOSIS — I10 Essential (primary) hypertension: Secondary | ICD-10-CM | POA: Diagnosis not present

## 2022-11-30 DIAGNOSIS — M12819 Other specific arthropathies, not elsewhere classified, unspecified shoulder: Secondary | ICD-10-CM | POA: Diagnosis present

## 2022-11-30 DIAGNOSIS — G8918 Other acute postprocedural pain: Secondary | ICD-10-CM | POA: Diagnosis not present

## 2022-11-30 DIAGNOSIS — Z01812 Encounter for preprocedural laboratory examination: Secondary | ICD-10-CM

## 2022-11-30 DIAGNOSIS — Z8673 Personal history of transient ischemic attack (TIA), and cerebral infarction without residual deficits: Secondary | ICD-10-CM | POA: Diagnosis not present

## 2022-11-30 DIAGNOSIS — M19011 Primary osteoarthritis, right shoulder: Secondary | ICD-10-CM | POA: Diagnosis not present

## 2022-11-30 DIAGNOSIS — Z87891 Personal history of nicotine dependence: Secondary | ICD-10-CM | POA: Diagnosis not present

## 2022-11-30 DIAGNOSIS — R8271 Bacteriuria: Secondary | ICD-10-CM

## 2022-11-30 DIAGNOSIS — M12811 Other specific arthropathies, not elsewhere classified, right shoulder: Secondary | ICD-10-CM | POA: Diagnosis not present

## 2022-11-30 DIAGNOSIS — Z96611 Presence of right artificial shoulder joint: Secondary | ICD-10-CM | POA: Diagnosis not present

## 2022-11-30 DIAGNOSIS — M7521 Bicipital tendinitis, right shoulder: Secondary | ICD-10-CM | POA: Diagnosis not present

## 2022-11-30 DIAGNOSIS — Z471 Aftercare following joint replacement surgery: Secondary | ICD-10-CM | POA: Diagnosis not present

## 2022-11-30 DIAGNOSIS — N3946 Mixed incontinence: Secondary | ICD-10-CM

## 2022-11-30 DIAGNOSIS — R8290 Unspecified abnormal findings in urine: Principal | ICD-10-CM

## 2022-11-30 HISTORY — DX: Migraine, unspecified, not intractable, without status migrainosus: G43.909

## 2022-11-30 HISTORY — PX: REVERSE SHOULDER ARTHROPLASTY: SHX5054

## 2022-11-30 LAB — GLUCOSE, CAPILLARY
Glucose-Capillary: 155 mg/dL — ABNORMAL HIGH (ref 70–99)
Glucose-Capillary: 112 mg/dL — ABNORMAL HIGH (ref 70–99)
Glucose-Capillary: 134 mg/dL — ABNORMAL HIGH (ref 70–99)
Glucose-Capillary: 166 mg/dL — ABNORMAL HIGH (ref 70–99)
Glucose-Capillary: 145 mg/dL — ABNORMAL HIGH (ref 70–99)

## 2022-11-30 LAB — ABO/RH: ABO/RH(D): O POS

## 2022-11-30 SURGERY — ARTHROPLASTY, SHOULDER, TOTAL, REVERSE
Anesthesia: General | Site: Shoulder | Laterality: Right

## 2022-11-30 MED ORDER — SODIUM CHLORIDE 0.9 % IV SOLN: INTRAVENOUS | Status: DC

## 2022-11-30 MED ORDER — DROPERIDOL 2.5 MG/ML IJ SOLN: 0.6250 mg | Freq: Once | INTRAMUSCULAR | Status: DC | PRN

## 2022-11-30 MED ORDER — ACETAMINOPHEN 500 MG PO TABS
1000.0000 mg | ORAL_TABLET | Freq: Three times a day (TID) | ORAL | Status: DC
  Administered 2022-11-30 – 2022-12-01 (×2): 1000 mg via ORAL

## 2022-11-30 MED ORDER — CEFAZOLIN SODIUM-DEXTROSE 2-4 GM/100ML-% IV SOLN
2.0000 g | Freq: Four times a day (QID) | INTRAVENOUS | Status: AC
  Administered 2022-11-30 – 2022-12-01 (×3): 2 g via INTRAVENOUS

## 2022-11-30 MED ORDER — BUPIVACAINE HCL (PF) 0.5 % IJ SOLN
INTRAMUSCULAR | Status: AC
  Filled 2022-11-30: qty 10

## 2022-11-30 MED ORDER — BISACODYL 10 MG RE SUPP: 10.0000 mg | Freq: Every day | RECTAL | Status: DC | PRN

## 2022-11-30 MED ORDER — FENTANYL CITRATE (PF) 100 MCG/2ML IJ SOLN
INTRAMUSCULAR | Status: AC
  Filled 2022-11-30: qty 2

## 2022-11-30 MED ORDER — ACETAMINOPHEN 10 MG/ML IV SOLN
INTRAVENOUS | Status: DC | PRN
  Administered 2022-11-30: 1000 mg via INTRAVENOUS

## 2022-11-30 MED ORDER — SENNOSIDES-DOCUSATE SODIUM 8.6-50 MG PO TABS: 1.0000 | ORAL_TABLET | Freq: Every evening | ORAL | Status: DC | PRN

## 2022-11-30 MED ORDER — CROMOLYN SODIUM 5.2 MG/ACT NA AERS: 1.0000 | INHALATION_SPRAY | NASAL | Status: DC | PRN

## 2022-11-30 MED ORDER — PHENYLEPHRINE 80 MCG/ML (10ML) SYRINGE FOR IV PUSH (FOR BLOOD PRESSURE SUPPORT)
PREFILLED_SYRINGE | INTRAVENOUS | Status: DC | PRN
  Administered 2022-11-30: 80 ug via INTRAVENOUS

## 2022-11-30 MED ORDER — INSULIN ASPART 100 UNIT/ML IJ SOLN
0.0000 [IU] | Freq: Three times a day (TID) | INTRAMUSCULAR | Status: DC
  Administered 2022-11-30: 3 [IU] via SUBCUTANEOUS
  Administered 2022-11-30: 2 [IU] via SUBCUTANEOUS
  Administered 2022-12-01: 3 [IU] via SUBCUTANEOUS
  Administered 2022-12-01: 5 [IU] via SUBCUTANEOUS

## 2022-11-30 MED ORDER — AMLODIPINE BESYLATE 10 MG PO TABS
10.0000 mg | ORAL_TABLET | Freq: Every day | ORAL | Status: DC
  Filled 2022-11-30: qty 1

## 2022-11-30 MED ORDER — ACETAMINOPHEN 10 MG/ML IV SOLN: 1000.0000 mg | Freq: Once | INTRAVENOUS | Status: DC | PRN

## 2022-11-30 MED ORDER — INSULIN ASPART 100 UNIT/ML IJ SOLN
INTRAMUSCULAR | Status: AC
  Filled 2022-11-30: qty 1

## 2022-11-30 MED ORDER — VANCOMYCIN HCL 1000 MG IV SOLR
INTRAVENOUS | Status: AC
  Filled 2022-11-30: qty 20

## 2022-11-30 MED ORDER — METOCLOPRAMIDE HCL 5 MG PO TABS: 5.0000 mg | ORAL_TABLET | Freq: Three times a day (TID) | ORAL | Status: DC | PRN

## 2022-11-30 MED ORDER — PROPOFOL 10 MG/ML IV BOLUS
INTRAVENOUS | Status: DC | PRN
  Administered 2022-11-30: 100 mg via INTRAVENOUS

## 2022-11-30 MED ORDER — PANTOPRAZOLE SODIUM 40 MG PO TBEC
80.0000 mg | DELAYED_RELEASE_TABLET | Freq: Every day | ORAL | Status: DC
  Administered 2022-12-01: 80 mg via ORAL

## 2022-11-30 MED ORDER — BUPIVACAINE LIPOSOME 1.3 % IJ SUSP
INTRAMUSCULAR | Status: DC | PRN
  Administered 2022-11-30: 20 mL via PERINEURAL

## 2022-11-30 MED ORDER — LORATADINE 10 MG PO TABS
10.0000 mg | ORAL_TABLET | Freq: Every day | ORAL | Status: DC
  Administered 2022-12-01: 10 mg via ORAL

## 2022-11-30 MED ORDER — VANCOMYCIN HCL 1000 MG IV SOLR
INTRAVENOUS | Status: DC | PRN
  Administered 2022-11-30: 1000 mg

## 2022-11-30 MED ORDER — MIDAZOLAM HCL 2 MG/2ML IJ SOLN
INTRAMUSCULAR | Status: AC
  Filled 2022-11-30: qty 2

## 2022-11-30 MED ORDER — MELATONIN 5 MG PO TABS
ORAL_TABLET | ORAL | Status: AC
  Filled 2022-11-30: qty 1

## 2022-11-30 MED ORDER — MIDAZOLAM HCL 2 MG/2ML IJ SOLN
1.0000 mg | INTRAMUSCULAR | Status: DC | PRN
  Administered 2022-11-30: 1 mg via INTRAVENOUS

## 2022-11-30 MED ORDER — LIDOCAINE HCL (PF) 1 % IJ SOLN
INTRAMUSCULAR | Status: AC
  Filled 2022-11-30: qty 5

## 2022-11-30 MED ORDER — FENTANYL CITRATE (PF) 100 MCG/2ML IJ SOLN
INTRAMUSCULAR | Status: DC | PRN
  Administered 2022-11-30: 50 ug via INTRAVENOUS

## 2022-11-30 MED ORDER — ACETAMINOPHEN 10 MG/ML IV SOLN
INTRAVENOUS | Status: AC
  Filled 2022-11-30: qty 100

## 2022-11-30 MED ORDER — FENTANYL CITRATE (PF) 100 MCG/2ML IJ SOLN: 25.0000 ug | INTRAMUSCULAR | Status: DC | PRN

## 2022-11-30 MED ORDER — HYDROMORPHONE HCL 1 MG/ML IJ SOLN: 0.2000 mg | INTRAMUSCULAR | Status: DC | PRN

## 2022-11-30 MED ORDER — CLOPIDOGREL BISULFATE 75 MG PO TABS
75.0000 mg | ORAL_TABLET | Freq: Every day | ORAL | Status: DC
  Administered 2022-12-01: 75 mg via ORAL

## 2022-11-30 MED ORDER — ROCURONIUM BROMIDE 10 MG/ML (PF) SYRINGE
PREFILLED_SYRINGE | INTRAVENOUS | Status: AC
  Filled 2022-11-30: qty 10

## 2022-11-30 MED ORDER — CITALOPRAM HYDROBROMIDE 20 MG PO TABS
40.0000 mg | ORAL_TABLET | Freq: Every day | ORAL | Status: DC
  Administered 2022-12-01: 40 mg via ORAL
  Filled 2022-11-30: qty 2

## 2022-11-30 MED ORDER — PROPOFOL 10 MG/ML IV BOLUS
INTRAVENOUS | Status: AC
  Filled 2022-11-30: qty 20

## 2022-11-30 MED ORDER — TRANEXAMIC ACID-NACL 1000-0.7 MG/100ML-% IV SOLN
1000.0000 mg | Freq: Once | INTRAVENOUS | Status: AC
  Administered 2022-11-30: 1000 mg via INTRAVENOUS

## 2022-11-30 MED ORDER — MELATONIN 5 MG PO TABS
10.0000 mg | ORAL_TABLET | Freq: Every day | ORAL | Status: DC
  Administered 2022-11-30: 10 mg via ORAL

## 2022-11-30 MED ORDER — TRANEXAMIC ACID-NACL 1000-0.7 MG/100ML-% IV SOLN
INTRAVENOUS | Status: AC
  Filled 2022-11-30: qty 100

## 2022-11-30 MED ORDER — ROCURONIUM BROMIDE 100 MG/10ML IV SOLN
INTRAVENOUS | Status: DC | PRN
  Administered 2022-11-30: 50 mg via INTRAVENOUS

## 2022-11-30 MED ORDER — OXYCODONE HCL 5 MG PO TABS
ORAL_TABLET | ORAL | Status: AC
  Filled 2022-11-30: qty 1

## 2022-11-30 MED ORDER — CEFAZOLIN SODIUM-DEXTROSE 2-4 GM/100ML-% IV SOLN
INTRAVENOUS | Status: AC
  Filled 2022-11-30: qty 100

## 2022-11-30 MED ORDER — EPHEDRINE 5 MG/ML INJ
INTRAVENOUS | Status: AC
  Filled 2022-11-30: qty 5

## 2022-11-30 MED ORDER — BUPIVACAINE HCL (PF) 0.5 % IJ SOLN
INTRAMUSCULAR | Status: DC | PRN
Start: 2022-11-30 — End: 2022-11-30
  Administered 2022-11-30: 5 mL via PERINEURAL

## 2022-11-30 MED ORDER — PHENYLEPHRINE HCL-NACL 20-0.9 MG/250ML-% IV SOLN
INTRAVENOUS | Status: AC
  Filled 2022-11-30: qty 250

## 2022-11-30 MED ORDER — METOCLOPRAMIDE HCL 5 MG/ML IJ SOLN: 5.0000 mg | Freq: Three times a day (TID) | INTRAMUSCULAR | Status: DC | PRN

## 2022-11-30 MED ORDER — SUCCINYLCHOLINE CHLORIDE 200 MG/10ML IV SOSY
PREFILLED_SYRINGE | INTRAVENOUS | Status: DC | PRN
  Administered 2022-11-30: 120 mg via INTRAVENOUS

## 2022-11-30 MED ORDER — METOPROLOL SUCCINATE ER 25 MG PO TB24
50.0000 mg | ORAL_TABLET | Freq: Every day | ORAL | Status: DC
  Administered 2022-12-01: 50 mg via ORAL
  Filled 2022-11-30: qty 2

## 2022-11-30 MED ORDER — OXYCODONE HCL 5 MG PO TABS: 10.0000 mg | ORAL_TABLET | ORAL | Status: DC | PRN

## 2022-11-30 MED ORDER — ASPIRIN 325 MG PO TBEC
325.0000 mg | DELAYED_RELEASE_TABLET | Freq: Every day | ORAL | Status: DC
  Administered 2022-12-01: 325 mg via ORAL

## 2022-11-30 MED ORDER — ACETAMINOPHEN 500 MG PO TABS
ORAL_TABLET | ORAL | Status: AC
  Filled 2022-11-30: qty 2

## 2022-11-30 MED ORDER — INSULIN ASPART 100 UNIT/ML IJ SOLN: 0.0000 [IU] | Freq: Every day | INTRAMUSCULAR | Status: DC

## 2022-11-30 MED ORDER — VITAMIN D (ERGOCALCIFEROL) 1.25 MG (50000 UNIT) PO CAPS: 50000.0000 [IU] | ORAL_CAPSULE | ORAL | Status: DC

## 2022-11-30 MED ORDER — INSULIN ASPART 100 UNIT/ML IJ SOLN
4.0000 [IU] | Freq: Three times a day (TID) | INTRAMUSCULAR | Status: DC
  Administered 2022-11-30 – 2022-12-01 (×3): 4 [IU] via SUBCUTANEOUS

## 2022-11-30 MED ORDER — SUGAMMADEX SODIUM 200 MG/2ML IV SOLN
INTRAVENOUS | Status: DC | PRN
  Administered 2022-11-30: 200 mg via INTRAVENOUS

## 2022-11-30 MED ORDER — EPHEDRINE SULFATE (PRESSORS) 50 MG/ML IJ SOLN
INTRAMUSCULAR | Status: DC | PRN
  Administered 2022-11-30: 10 mg via INTRAVENOUS

## 2022-11-30 MED ORDER — ONDANSETRON HCL 4 MG PO TABS: 4.0000 mg | ORAL_TABLET | Freq: Four times a day (QID) | ORAL | Status: DC | PRN

## 2022-11-30 MED ORDER — LIDOCAINE HCL (PF) 1 % IJ SOLN
INTRAMUSCULAR | Status: DC | PRN
Start: 2022-11-30 — End: 2022-11-30
  Administered 2022-11-30: 2 mL via SUBCUTANEOUS

## 2022-11-30 MED ORDER — ONDANSETRON HCL 4 MG/2ML IJ SOLN
INTRAMUSCULAR | Status: AC
  Filled 2022-11-30: qty 2

## 2022-11-30 MED ORDER — DOCUSATE SODIUM 100 MG PO CAPS
100.0000 mg | ORAL_CAPSULE | Freq: Two times a day (BID) | ORAL | Status: DC
  Administered 2022-11-30 – 2022-12-01 (×2): 100 mg via ORAL

## 2022-11-30 MED ORDER — PHENOL 1.4 % MT LIQD: 1.0000 | OROMUCOSAL | Status: DC | PRN

## 2022-11-30 MED ORDER — VITAMIN B-12 1000 MCG PO TABS
1000.0000 ug | ORAL_TABLET | Freq: Every day | ORAL | Status: DC
  Administered 2022-12-01: 1000 ug via ORAL
  Filled 2022-11-30: qty 1

## 2022-11-30 MED ORDER — DOCUSATE SODIUM 100 MG PO CAPS
ORAL_CAPSULE | ORAL | Status: AC
  Filled 2022-11-30: qty 1

## 2022-11-30 MED ORDER — CHLORHEXIDINE GLUCONATE 0.12 % MT SOLN
OROMUCOSAL | Status: AC
  Filled 2022-11-30: qty 15

## 2022-11-30 MED ORDER — ALUM & MAG HYDROXIDE-SIMETH 200-200-20 MG/5ML PO SUSP: 30.0000 mL | ORAL | Status: DC | PRN

## 2022-11-30 MED ORDER — ROSUVASTATIN CALCIUM 20 MG PO TABS
20.0000 mg | ORAL_TABLET | Freq: Every day | ORAL | Status: DC
  Administered 2022-12-01: 20 mg via ORAL

## 2022-11-30 MED ORDER — LIDOCAINE HCL (CARDIAC) PF 100 MG/5ML IV SOSY
PREFILLED_SYRINGE | INTRAVENOUS | Status: DC | PRN
  Administered 2022-11-30: 60 mg via INTRAVENOUS

## 2022-11-30 MED ORDER — OXYCODONE HCL 5 MG/5ML PO SOLN: 5.0000 mg | Freq: Once | ORAL | Status: DC | PRN

## 2022-11-30 MED ORDER — LIDOCAINE HCL (PF) 2 % IJ SOLN
INTRAMUSCULAR | Status: AC
  Filled 2022-11-30: qty 5

## 2022-11-30 MED ORDER — PHENYLEPHRINE HCL-NACL 20-0.9 MG/250ML-% IV SOLN
INTRAVENOUS | Status: DC | PRN
Start: 2022-11-30 — End: 2022-11-30
  Administered 2022-11-30: 50 ug/min via INTRAVENOUS

## 2022-11-30 MED ORDER — MENTHOL 3 MG MT LOZG: 1.0000 | LOZENGE | OROMUCOSAL | Status: DC | PRN

## 2022-11-30 MED ORDER — PROMETHAZINE HCL 25 MG/ML IJ SOLN: 6.2500 mg | INTRAMUSCULAR | Status: DC | PRN

## 2022-11-30 MED ORDER — OXYCODONE HCL 5 MG PO TABS: 5.0000 mg | ORAL_TABLET | Freq: Once | ORAL | Status: DC | PRN

## 2022-11-30 MED ORDER — 0.9 % SODIUM CHLORIDE (POUR BTL) OPTIME
TOPICAL | Status: DC | PRN
  Administered 2022-11-30: 1000 mL

## 2022-11-30 MED ORDER — OXYCODONE HCL 5 MG PO TABS
5.0000 mg | ORAL_TABLET | ORAL | Status: DC | PRN
  Administered 2022-11-30 – 2022-12-01 (×3): 5 mg via ORAL

## 2022-11-30 MED ORDER — ONDANSETRON HCL 4 MG/2ML IJ SOLN: 4.0000 mg | Freq: Four times a day (QID) | INTRAMUSCULAR | Status: DC | PRN

## 2022-11-30 MED ORDER — BUPIVACAINE LIPOSOME 1.3 % IJ SUSP
INTRAMUSCULAR | Status: AC
  Filled 2022-11-30: qty 20

## 2022-11-30 MED ORDER — PHENYLEPHRINE 80 MCG/ML (10ML) SYRINGE FOR IV PUSH (FOR BLOOD PRESSURE SUPPORT)
PREFILLED_SYRINGE | INTRAVENOUS | Status: AC
  Filled 2022-11-30: qty 10

## 2022-11-30 MED ORDER — MIRABEGRON ER 50 MG PO TB24
50.0000 mg | ORAL_TABLET | Freq: Every day | ORAL | Status: DC
  Administered 2022-12-01: 50 mg via ORAL
  Filled 2022-11-30: qty 1

## 2022-11-30 MED ORDER — OYSTER SHELL CALCIUM/D3 500-5 MG-MCG PO TABS
1.0000 | ORAL_TABLET | Freq: Every day | ORAL | Status: DC
  Administered 2022-12-01: 1 via ORAL
  Filled 2022-11-30: qty 1

## 2022-11-30 MED ORDER — ONDANSETRON HCL 4 MG/2ML IJ SOLN
INTRAMUSCULAR | Status: DC | PRN
  Administered 2022-11-30: 4 mg via INTRAVENOUS

## 2022-11-30 MED ORDER — HYDROCHLOROTHIAZIDE 25 MG PO TABS
12.5000 mg | ORAL_TABLET | Freq: Every day | ORAL | Status: DC
  Administered 2022-12-01: 12.5 mg via ORAL

## 2022-11-30 SURGICAL SUPPLY — 82 items
ADH SKN CLS APL DERMABOND .7 (GAUZE/BANDAGES/DRESSINGS) ×1
ANCH SUT .5 CRC TPR CT 40X40 (SUTURE)
ANCH SUT 1.4 SUT TPE BLK/WHT (SUTURE) ×1
APL PRP STRL LF DISP 70% ISPRP (MISCELLANEOUS) ×1
BASEPLATE P2 COATD GLND 6.5X30 (Shoulder) IMPLANT
BIT DRILL 4 DIA CALIBRATED (BIT) IMPLANT
BIT DRILL GUIDE PATIENT MATCH (MISCELLANEOUS) IMPLANT
BLADE SAGITTAL WIDE XTHICK NO (BLADE) ×2 IMPLANT
BSPLAT GLND 30 STRL LF SHLDR (Shoulder) ×1 IMPLANT
CHLORAPREP W/TINT 26 (MISCELLANEOUS) ×2 IMPLANT
CNTNR URN SCR LID CUP LEK RST (MISCELLANEOUS) IMPLANT
CONT SPEC 4OZ STRL OR WHT (MISCELLANEOUS)
COOLER POLAR GLACIER W/PUMP (MISCELLANEOUS) ×2 IMPLANT
DERMABOND ADVANCED .7 DNX12 (GAUZE/BANDAGES/DRESSINGS) IMPLANT
DRAPE INCISE IOBAN 66X45 STRL (DRAPES) ×4 IMPLANT
DRAPE SHEET LG 3/4 BI-LAMINATE (DRAPES) ×4 IMPLANT
DRAPE TABLE BACK 80X90 (DRAPES) ×2 IMPLANT
DRAPE U-SHAPE 47X51 STRL (DRAPES) ×2 IMPLANT
DRILL GUIDE PATIENT MATCH (MISCELLANEOUS) ×1
DRSG OPSITE POSTOP 3X4 (GAUZE/BANDAGES/DRESSINGS) IMPLANT
DRSG OPSITE POSTOP 4X8 (GAUZE/BANDAGES/DRESSINGS) IMPLANT
DRSG TEGADERM 2-3/8X2-3/4 SM (GAUZE/BANDAGES/DRESSINGS) IMPLANT
ELECT REM PT RETURN 9FT ADLT (ELECTROSURGICAL) ×1
ELECTRODE REM PT RTRN 9FT ADLT (ELECTROSURGICAL) ×2 IMPLANT
GAUZE SPONGE 2X2 STRL 8-PLY (GAUZE/BANDAGES/DRESSINGS) IMPLANT
GAUZE XEROFORM 1X8 LF (GAUZE/BANDAGES/DRESSINGS) IMPLANT
GLOVE BIOGEL PI IND STRL 8 (GLOVE) ×4 IMPLANT
GLOVE PI ULTRA LF STRL 7.5 (GLOVE) ×4 IMPLANT
GLOVE SURG ORTHO 8.0 STRL STRW (GLOVE) ×4 IMPLANT
GLOVE SURG SYN 8.0 (GLOVE) ×1 IMPLANT
GLOVE SURG SYN 8.0 PF PI (GLOVE) ×2 IMPLANT
GOWN STRL REUS W/ TWL LRG LVL3 (GOWN DISPOSABLE) ×4 IMPLANT
GOWN STRL REUS W/ TWL XL LVL3 (GOWN DISPOSABLE) ×2 IMPLANT
GOWN STRL REUS W/TWL LRG LVL3 (GOWN DISPOSABLE) ×2
GOWN STRL REUS W/TWL XL LVL3 (GOWN DISPOSABLE) ×1
HEMOVAC 400CC 10FR (MISCELLANEOUS) IMPLANT
HOOD PEEL AWAY T7 (MISCELLANEOUS) ×4 IMPLANT
HUMERA STEM SM SHELL SHOU 10 (Miscellaneous) ×1 IMPLANT
INSERT SMALL SOCKET 32MM NEU (Insert) IMPLANT
MANIFOLD NEPTUNE II (INSTRUMENTS) ×2 IMPLANT
MASK FACE SPIDER DISP (MASK) ×2 IMPLANT
MAT ABSORB FLUID 56X50 GRAY (MISCELLANEOUS) ×2 IMPLANT
NDL REVERSE CUT 1/2 CRC (NEEDLE) IMPLANT
NDL SPNL 20GX3.5 QUINCKE YW (NEEDLE) IMPLANT
NEEDLE REVERSE CUT 1/2 CRC (NEEDLE) ×1 IMPLANT
NEEDLE SPNL 20GX3.5 QUINCKE YW (NEEDLE) ×1 IMPLANT
NS IRRIG 1000ML POUR BTL (IV SOLUTION) ×2 IMPLANT
P2 COATDE GLNOID BSEPLT 6.5X30 (Shoulder) ×1 IMPLANT
PACK ARTHROSCOPY SHOULDER (MISCELLANEOUS) ×2 IMPLANT
PAD WRAPON POLAR SHDR XLG (MISCELLANEOUS) ×2 IMPLANT
PULSAVAC PLUS IRRIG FAN TIP (DISPOSABLE) ×1
SCREW BONE LOCKING RSP 5.0X14 (Screw) ×1 IMPLANT
SCREW BONE LOCKING RSP 5.0X30 (Screw) ×1 IMPLANT
SCREW BONE RSP LOCK 5X14 (Screw) IMPLANT
SCREW BONE RSP LOCK 5X26 (Screw) IMPLANT
SCREW BONE RSP LOCK 5X30 (Screw) IMPLANT
SCREW BONE RSP LOCKING 5.0X26 (Screw) ×1 IMPLANT
SCREW RETAIN W/HEAD 4MM OFFSET (Shoulder) IMPLANT
SLING ULTRA II LG (MISCELLANEOUS) IMPLANT
SLING ULTRA II M (MISCELLANEOUS) IMPLANT
SPONGE T-LAP 18X18 ~~LOC~~+RFID (SPONGE) ×2 IMPLANT
STAPLER SKIN PROX 35W (STAPLE) IMPLANT
STEM HUMERAL SM SHELL SHOU 10 (Miscellaneous) IMPLANT
STRAP SAFETY 5IN WIDE (MISCELLANEOUS) ×2 IMPLANT
SUT ETHIBOND 5-0 MS/4 CCS GRN (SUTURE) ×1
SUT FIBERWIRE #2 38 BLUE 1/2 (SUTURE) ×4
SUT MNCRL AB 4-0 PS2 18 (SUTURE) IMPLANT
SUT PROLENE 6 0 P 1 18 (SUTURE) IMPLANT
SUT TICRON 2-0 30IN 311381 (SUTURE) ×4 IMPLANT
SUT VIC AB 0 CT1 36 (SUTURE) ×2 IMPLANT
SUT VIC AB 2-0 CT2 27 (SUTURE) ×4 IMPLANT
SUT XBRAID 1.4 BLK/WHT (SUTURE) IMPLANT
SUT XBRAID 1.4 BLUE (SUTURE) IMPLANT
SUT XBRAID 1.4 WHITE/BLUE (SUTURE) IMPLANT
SUT XBRAID 2 BLACK/BLUE (SUTURE) IMPLANT
SUTURE ETHBND 5-0 MS/4 CCS GRN (SUTURE) ×2 IMPLANT
SUTURE FIBERWR #2 38 BLUE 1/2 (SUTURE) ×2 IMPLANT
SYR 30ML LL (SYRINGE) IMPLANT
TIP FAN IRRIG PULSAVAC PLUS (DISPOSABLE) ×2 IMPLANT
TRAP FLUID SMOKE EVACUATOR (MISCELLANEOUS) ×2 IMPLANT
WATER STERILE IRR 500ML POUR (IV SOLUTION) ×2 IMPLANT
WRAPON POLAR PAD SHDR XLG (MISCELLANEOUS) ×1

## 2022-11-30 NOTE — Transfer of Care (Signed)
Immediate Anesthesia Transfer of Care Note  Patient: Tracey Russo AUG  Procedure(s) Performed: Right reverse shoulder arthroplasty, biceps tenodesis (Right: Shoulder)  Patient Location: PACU  Anesthesia Type:General  Level of Consciousness: awake, alert , and oriented  Airway & Oxygen Therapy: Patient Spontanous Breathing and Patient connected to face mask oxygen  Post-op Assessment: Report given to RN, Post -op Vital signs reviewed and stable, and Patient moving all extremities X 4  Post vital signs: Reviewed and stable  Last Vitals:  Vitals Value Taken Time  BP 146/77 11/30/22 1018  Temp    Pulse 80 11/30/22 1019  Resp 15 11/30/22 1019  SpO2 95 % 11/30/22 1019  Vitals shown include unfiled device data.  Last Pain:  Vitals:   11/30/22 0736  TempSrc:   PainSc: 0-No pain         Complications: No notable events documented.

## 2022-11-30 NOTE — Evaluation (Signed)
Occupational Therapy Evaluation Patient Details Name: Tracey Russo MRN: [id number] DOB: [birth year] Today's Date: 11/30/2022   History of Present Illness Tracey Russo is a 72 y.o. female with chronic shoulder pain with inability to lift arm overhead. Imaging was consistent with massive, irreparable rotator cuff tear. S/p R reverse TSA with biceps tenodesis. PMH: HTN, hx of CVA   Clinical Impression   Tracey Russo was seen for OT evaluation this date. Prior to hospital admission, pt was MOD I using SPC as needed. Pt lives with spouse. Pt currently requiresSUPERVISION for toilet t/f and standing hand washing. MAX A don/doff short in sitting with cues for spouse to don/doff polar care and sling. MOD A don pants, spouse assisted with threading and pulling up in standing. Pt and spouse instructed in compression stockings mgt, sling/immobilizer mgt, ROM exercises for RUE , RUE precautions, adaptive strategies for bathing/dressing, and positioning considerations for sleep. All education complete, will sign off. Upon hospital discharge, recommend no OT follow up.    If plan is discharge home, recommend the following: Help with stairs or ramp for entrance    Functional Status Assessment  Patient has had a recent decline in their functional status and demonstrates the ability to make significant improvements in function in a reasonable and predictable amount of time.  Equipment Recommendations  None recommended by OT    Recommendations for Other Services       Precautions / Restrictions Precautions Precautions: None Restrictions Weight Bearing Restrictions: Yes RUE Weight Bearing: Non weight bearing      Mobility Bed Mobility Overal bed mobility: Modified Independent             General bed mobility comments: pulls on rails/hand, bridges, no direct assist    Transfers Overall transfer level: Needs assistance   Transfers: Sit to/from Stand Sit to Stand: Supervision                   Balance Overall balance assessment: Needs assistance Sitting-balance support: No upper extremity supported, Feet supported Sitting balance-Leahy Scale: Normal     Standing balance support: No upper extremity supported, During functional activity Standing balance-Leahy Scale: Good                             ADL either performed or assessed with clinical judgement   ADL Overall ADL's : Needs assistance/impaired                                       General ADL Comments: SUPERVISION for toilet t/f and standing hand washing. MAX A don/doff short in sitting with cues for spouse to don/doff polar care and sling. MOD A don pants, spouse assisted with threading and pulling up in standing      Pertinent Vitals/Pain Pain Assessment Pain Assessment: No/denies pain     Extremity/Trunk Assessment Upper Extremity Assessment Upper Extremity Assessment: Right hand dominant;Overall [facility] for tasks assessed   Lower Extremity Assessment Lower Extremity Assessment: Overall WFL for tasks assessed       Communication     Cognition Arousal: Alert Behavior During Therapy: WFL for tasks assessed/performed Overall Cognitive Status: Within Functional Limits for tasks assessed  General Comments  oxygen doffed, maintained mid 90s on RA            Home Living Family/patient expects to be discharged to:: Private residence Living Arrangements: Spouse/significant other Available Help at Discharge: Family;Available 24 hours/day Type of Home: House Home Access: Stairs to enter [employer] of Steps: 3   Home Layout: One level         Bathroom Toilet: Handicapped height     Home Equipment: Rollator (4 wheels);Cane - single point;Shower seat;BSC/3in1;Toilet riser          Prior Functioning/Environment Prior Level of Function : Independent/Modified Independent                         OT Problem List: Decreased strength;Decreased activity tolerance;Impaired balance (sitting and/or standing)         OT Goals(Current goals can be found in the care plan section) Acute Rehab OT Goals Patient Stated Goal: to go home OT Goal Formulation: With patient/family Time For Goal Achievement: 12/14/22 Potential to Achieve Goals: Good  OT Frequency:      Co-evaluation PT/OT/SLP Co-Evaluation/Treatment: Yes Reason for Co-Treatment: To address functional/ADL transfers PT goals addressed during session: Mobility/safety with mobility OT goals addressed during session: ADL's and self-care      AM-PAC OT "6 Clicks" Daily Activity     Outcome Measure Help from another person eating meals?: None Help from another person taking care of personal grooming?: A Little Help from another person toileting, which includes using toliet, bedpan, or urinal?: A Little Help from another person bathing (including washing, rinsing, drying)?: A Lot Help from another person to put on and taking off regular upper body clothing?: A Lot Help from another person to put on and taking off regular lower body clothing?: A Lot 6 Click Score: 16   End of Session    Activity Tolerance: Patient tolerated treatment well Patient left: in bed;with call bell/phone within reach  OT Visit Diagnosis: Unsteadiness on feet (R26.81)                Time: 0272-5366 OT Time Calculation (min): 29 min Charges:  OT General Charges $OT Visit: 1 Visit OT Evaluation $OT Eval Low Complexity: 1 Low OT Treatments $Self Care/Home Management : 8-22 mins  Kathie Dike, M.S. OTR/L  11/30/22, 2:29 PM  ascom [phone]

## 2022-11-30 NOTE — Op Note (Signed)
SURGERY DATE: 11/30/2022   PRE-OP DIAGNOSIS:  1. Right shoulder rotator cuff arthropathy   POST-OP DIAGNOSIS:  1. Right shoulder rotator cuff arthropathy   PROCEDURES:  1. Right reverse total shoulder arthroplasty 2. Right biceps tenodesis   SURGEON: Rosealee Albee, MD  ASSISTANTS: Dedra Skeens, PA   ANESTHESIA: Gen + interscalene block   ESTIMATED BLOOD LOSS: 200cc   TOTAL IV FLUIDS: per anesthesia record  IMPLANTS: [employer]: RSP Glenoid Head w/Retaining screw 32-4; Monoblock Reverse Shoulder Baseplate with 6.52mm central screw; 3 locking screws into baseplate (superior, anterior, inferior); Small Shell Short Humeral Stem 10 x 48mm; Neutral Small Socket Insert;    INDICATION(S):  Tracey Russo is a 72 y.o. female with chronic shoulder pain with inability to lift arm overhead. Imaging was consistent with massive, irreparable rotator cuff tear. Conservative measures including medications and cortisone injections have not provided adequate relief. After discussion of risks, benefits, and alternatives to surgery, the patient elected to proceed with reverse shoulder arthroplasty and biceps tenodesis.   OPERATIVE FINDINGS: irreparable rotator cuff tear (complete supraspinatus, partial infraspinatus, partial subscapularis)   OPERATIVE REPORT:   I identified Gerlene Fee in the pre-operative holding area. Informed consent was obtained and the surgical site was marked. I reviewed the risks and benefits of the proposed surgical intervention and the patient (and/or patient's guardian) wished to proceed. An interscalene block with Exparel was administered by the Anesthesia team. The patient was transferred to the operative suite and general anesthesia was administered. The patient was placed in the beach chair position with the head of the bed elevated approximately 45 degrees. All down side pressure points were appropriately padded. Pre-op exam under anesthesia confirmed some stiffness and  crepitus. Appropriate IV antibiotics were administered. The extremity was then prepped and draped in standard fashion. A time out was performed confirming the correct extremity, correct patient, and correct procedure.   We used the standard deltopectoral incision from the coracoid to ~12cm distal. We found the cephalic vein and took it laterally. We opened the deltopectoral interval widely and placed retractors under the CA ligament in the subacromial space and under the deltoid tendon at its insertion. We then abducted and internally rotated the arm and released the underlying bursa between these retractors, taking care not to damage the circumflex branch of the axillary nerve.   Next, we brought the arm back in adduction at slight forward flexion with external rotation. We opened the clavipectoral fascia lateral to the conjoint tendon. We gently palpated the axillary nerve and verified its position and continuity on both sides of the humerus with a Tug test. This test was repeated multiple times during the procedure for nerve localization and confirmed to be intact at the end of the case. We then cauterized the anterior humeral circumflex ("Three sisters") vessels. The arm was then internally rotated, we cut the falciform ligament at approximately 1 cm of the upper portion of the pectoralis major insertion. Next we unroofed the bicipital groove. We proceeded with a soft tissue biceps tenodesis given the pathology (significant thickening and tendinopathy proximally) of the tendon.  After opening the biceps tendon sheath all the way to the supraglenoid tubercle, we performed a biceps tenodesis with two #2 TiCron sutures to the upper border of the pectoralis major. The proximal portion of the tendon was excised.   At this point, we could see that the supraspinatus was completely torn with a bald humeral head superiorly.  The anterior aspect of the infraspinatus was  also torn with the posterior fibers remaining  attached to the humeral head.  The subscapularis also had a partial tear of the superior fibers. We performed a subscapularis peel using electrocautery to remove the anterior capsule and subscapularis off of the humeral head. We released the inferior capsule from the humerus all the way to the posterior band of the inferior glenohumeral ligament. When this was complete we gently dislocated the shoulder up into the wound. We removed any osteophytes and made our cut with the appropriate inclination in 30 degrees of retroversion  We then turned our attention back to the glenoid. The proximal humerus was retracted posteriorly. The anterior capsule was dissected free from the subscapularis. The anterior capsule was then excised, exposing the anterior glenoid. We then grasped the labrum and removed it circumferentially. During the glenoid exposure, the axillary nerve was protected the entire time.    A patient-specific guide was used to drill the central guidepin. A tap was placed, matching the trajectory of the guidepin. An appropriately sized reamer was used to ream the glenoid. The monoblock baseplate was inserted and excellent fixation was achieved such that the entire scapula rotated with further attempted seating of the baseplate. The 3 peripheral screws were drilled, measured, and placed. A 32-4 glenosphere was then placed and tightened.   We then turned our attention back to the humerus. We sized for a small shell prosthesis. We sequentially used larger diameter canal finders until we met significant resistance and sequential broaching was performed to this size listed above. We trialed both a 0 and +4 poly. The humerus was trialed and noted to have satisfactory stability, motion, and deltoid tension with a 0 poly. The trial implants were removed. The humeral canal was pulse lavaged. 3 drill holes were placed about the lesser tuberosity footprint and FiberWire sutures were passed through these holes for  subscapularis repair. Next, the implant was placed with the appropriate retroversion. Stability was confirmed. We placed an 0 poly. The humerus was reduced and motion, tension, and stability were satisfactory. A Hemovac drain was placed. Subscapularis was repaired with the previously passed #2 FiberWire sutures after passing them through the subscapularis.   We again verified the tension on the axillary nerve, appropriate range of motion, stability of the implant, and security of the subscapularis repair. We closed the deltopectoral interval deep to the cephalic vein with a running, 0-Vicryl suture. The skin was closed with 2-0 Vicryl and 4-0 Monocryl. Xeroform and Honeycomb dressing was applied. A PolarCare unit and sling were placed. Patient was extubated, transferred to a stretcher bed and to the post antesthesia care unit in stable condition.   Of note, assistance from a PA was essential to performing the surgery.  PA was present for the entire surgery.  PA assisted with patient positioning, retraction, instrumentation, and wound closure. The surgery would have been more difficult and had longer operative time without PA assistance.    POSTOPERATIVE PLAN: The patient will be admitted with plan for discharge home on POD#1. Operative arm to remain in sling at all times except RoM exercises and hygiene. Can perform pendulums, elbow/wrist/hand RoM exercises. Passive RoM allowed to 90 FF and 30 ER. ASA 325mg  x 6 weeks for DVT ppx. Plan for PT starting on POD #3-4. Patient to return to clinic in ~2 weeks for post-operative appointment.

## 2022-11-30 NOTE — Anesthesia Preprocedure Evaluation (Signed)
Anesthesia Evaluation  Patient identified by MRN, date of birth, ID band Patient awake    Reviewed: Allergy & Precautions, H&P , NPO status , Patient's Chart, lab work & pertinent test results, reviewed documented beta blocker date and time   History of Anesthesia Complications (+) Family history of anesthesia reaction and history of anesthetic complications  Airway Mallampati: III  TM Distance: >3 FB Neck ROM: full    Dental  (+) Teeth Intact   Pulmonary sleep apnea and Continuous Positive Airway Pressure Ventilation , former smoker   Pulmonary exam normal        Cardiovascular Exercise Tolerance: Poor hypertension, On Medications + angina with exertion Normal cardiovascular exam Rhythm:regular Rate:Normal     Neuro/Psych  Headaches PSYCHIATRIC DISORDERS  Depression    CVA, Residual Symptoms    GI/Hepatic Neg liver ROS,GERD  Medicated,,  Endo/Other  negative endocrine ROSdiabetes, Well Controlled, Type 1, Insulin Dependent    Renal/GU Renal disease  negative genitourinary   Musculoskeletal   Abdominal   Peds  Hematology negative hematology ROS (+)   Anesthesia Other Findings Past Medical History: No date: Allergy No date: Arthritis No date: Bilateral carotid artery stenosis No date: Cancer (HCC)     Comment:  pre leukemia No date: Complication of anesthesia     Comment:  had TIA after neck surgery No date: Depression No date: Diabetes mellitus without complication (HCC) No date: Family history of adverse reaction to anesthesia     Comment:  father has CVA after bypass surgery No date: GERD (gastroesophageal reflux disease) No date: History of kidney stones No date: Hyperlipidemia No date: Hypertension No date: LVH (left ventricular hypertrophy) due to hypertensive  disease, without heart failure No date: Migraines No date: Neuropathy due to type 2 diabetes mellitus (HCC) No date: OSA (obstructive sleep  apnea) No date: Polycythemia No date: Rotator cuff arthropathy of right shoulder No date: Stroke [facility])     Comment:  after recovery had a TIA No date: Vitamin D deficiency Past Surgical History: 12/07/2000: CERVICAL FUSION 03/22/2004: COLONOSCOPY     Comment:  Dr Mechele Collin 07/30/2020: COLONOSCOPY WITH PROPOFOL; N/A     Comment:  Procedure: COLONOSCOPY WITH PROPOFOL;  Surgeon: [name],               Boykin Nearing, MD;  Location: [facility];  Service:               Gastroenterology;  Laterality: N/A;  IDDM 09/19/1984: CYSTOSTOMY 11/21/2006: SHOULDER SURGERY; Right     Comment:  tumor removed 04/16/2007: SUBDURAL HEMATOMA EVACUATION VIA CRANIOTOMY 03/22/2001: TONSILECTOMY, ADENOIDECTOMY, BILATERAL MYRINGOTOMY AND  TUBES No date: TUMOR REMOVAL     Comment:  in throat 02/23/2000: VAGINAL HYSTERECTOMY BMI    Body Mass Index: 32.01 kg/m     Reproductive/Obstetrics negative OB ROS                             Anesthesia Physical Anesthesia Plan  ASA: 3  Anesthesia Plan: General ETT   Post-op Pain Management: Regional block*   Induction:   PONV Risk Score and Plan: 4 or greater  Airway Management Planned:   Additional Equipment:   Intra-op Plan:   Post-operative Plan:   Informed Consent: I have reviewed the patients History and Physical, chart, labs and discussed the procedure including the risks, benefits and alternatives for the proposed anesthesia with the patient or authorized representative who has indicated his/her understanding and acceptance.  Dental Advisory Given  Plan Discussed with: CRNA  Anesthesia Plan Comments:        Anesthesia Quick Evaluation

## 2022-11-30 NOTE — Anesthesia Procedure Notes (Signed)
Procedure Name: Intubation Date/Time: 11/30/2022 7:45 AM  Performed by: Nelle Don, CRNAPre-anesthesia Checklist: Patient identified, Emergency Drugs available, Suction available and Patient being monitored Patient Re-evaluated:Patient Re-evaluated prior to induction Oxygen Delivery Method: Circle system utilized Preoxygenation: Pre-oxygenation with 100% oxygen Induction Type: IV induction Ventilation: Mask ventilation without difficulty Laryngoscope Size: McGraph and 4 Grade View: Grade I Tube type: Oral Tube size: 7.0 mm Number of attempts: 1 Airway Equipment and Method: Stylet and Video-laryngoscopy Placement Confirmation: ETT inserted through vocal cords under direct vision, positive ETCO2 and breath sounds checked- equal and bilateral Secured at: 22 cm Tube secured with: Tape Dental Injury: Teeth and Oropharynx as per pre-operative assessment  Comments: Elective Mcgraph

## 2022-11-30 NOTE — Anesthesia Procedure Notes (Signed)
Anesthesia Regional Block: Interscalene brachial plexus block   Pre-Anesthetic Checklist: , timeout performed,  Correct Patient, Correct Site, Correct Laterality,  Correct Procedure, Correct Position, site marked,  Risks and benefits discussed,  Surgical consent,  Pre-op evaluation,  At surgeon's request and post-op pain management  Laterality: Upper and Left  Prep: chloraprep       Needles:  Injection technique: Single-shot  Needle Type: Echogenic Stimulator Needle     Needle Length: 10cm  Needle Gauge: 20     Additional Needles:   Procedures:,,,, ultrasound used (permanent image in chart),,    Narrative:  End time: 11/30/2022 7:34 AM Injection made incrementally with aspirations every 5 mL.  Performed by: Personally  Anesthesiologist: Yevette Edwards, MD  Additional Notes: Pt. Identified and accepting of procedure after risks and benefits fully reviewed and questions answered. Time out performed and laterality confirmed prior to procedure.  ISNB  performed without difficulty and well tolerated.  Neg IV and SATD.  No pain on injection of Local anesthetic and VSST.

## 2022-11-30 NOTE — Evaluation (Signed)
Physical Therapy Evaluation Patient Details Name: Tracey Russo MRN: [id number] DOB: 08-02-1950 Today's Date: 11/30/2022  History of Present Illness  Tracey Russo is a 72 y.o. female with chronic shoulder pain with inability to lift arm overhead. Imaging was consistent with massive, irreparable rotator cuff tear. S/p R reverse TSA with biceps tenodesis. PMH: HTN, hx of CVA  Clinical Impression   Pt presents seated EOB with OT, no complaints of pain. She lives with her husband in a 1 story home with 3 steps to enter and B/L rails. PTA she would use a trekking pole for ambulation and was MODI for ADLs.   PT/OT overlap for pt/family education and donning sling/drain management. Pt performed sit<>stand supervision, ambulated ~173ft CGA, and ascended/descended 4 steps with L rail and CGA.  PT noted decreased awareness of RUE in space requiring intial therapist facilitation to avoid bumping into walls/objects, able to correct by end of session. She would benefit from continued skilled therapy to maximize functional abilities.       If plan is discharge home, recommend the following: A little help with walking and/or transfers;A little help with bathing/dressing/bathroom;Assistance with cooking/housework;Direct supervision/assist for medications management;Help with stairs or ramp for entrance;Assist for transportation   Can travel by private vehicle        Equipment Recommendations None recommended by PT  Recommendations for Other Services       Functional Status Assessment Patient has had a recent decline in their functional status and demonstrates the ability to make significant improvements in function in a reasonable and predictable amount of time.     Precautions / Restrictions Precautions Precautions: None Restrictions Weight Bearing Restrictions: Yes RUE Weight Bearing: Non weight bearing      Mobility  Bed Mobility               General bed mobility comments: not  observed during session    Transfers Overall transfer level: Needs assistance Equipment used: None Transfers: Sit to/from Stand Sit to Stand: Supervision           General transfer comment: supervision for safety/ drain management    Ambulation/Gait Ambulation/Gait assistance: Contact guard assist Gait Distance (Feet): 180 Feet Assistive device: None Gait Pattern/deviations: Step-through pattern Gait velocity: decreased     General Gait Details: decreased awareness of RUE in space requiring intial therapist facilitation to avoid bumping into walls/objects, able to correct by end of session  Stairs Stairs: Yes Stairs assistance: Contact guard assist Stair Management: One rail Left Number of Stairs: 4 General stair comments: ascend/descend  Wheelchair Mobility     Tilt Bed    Modified Rankin (Stroke Patients Only)       Balance Overall balance assessment: Needs assistance Sitting-balance support: No upper extremity supported, Feet supported Sitting balance-Leahy Scale: Normal     Standing balance support: No upper extremity supported, During functional activity Standing balance-Leahy Scale: Good                               Pertinent Vitals/Pain Pain Assessment Pain Assessment: No/denies pain    Home Living Family/patient expects to be discharged to:: Private residence Living Arrangements: Spouse/significant other Available Help at Discharge: Family;Available 24 hours/day Type of Home: House Home Access: Stairs to enter   [employer] of Steps: 3   Home Layout: One level Home Equipment: Rollator (4 wheels);Cane - single point;Shower seat;BSC/3in1;Toilet riser;Other (comment) (trekking pole)      Prior  Function Prior Level of Function : Independent/Modified Independent             Mobility Comments: uses trekking pole for ambulation       Extremity/Trunk Assessment   Upper Extremity Assessment Upper Extremity  Assessment: Overall WFL for tasks assessed;Right hand dominant    Lower Extremity Assessment Lower Extremity Assessment: Overall WFL for tasks assessed       Communication   Communication Communication: No apparent difficulties  Cognition Arousal: Alert Behavior During Therapy: WFL for tasks assessed/performed Overall Cognitive Status: Within Functional Limits for tasks assessed                                          General Comments General comments (skin integrity, edema, etc.): O2 reassessed at end of session, initially 89% following mobility which recovered to 94% once seated    Exercises     Assessment/Plan    PT Assessment Patient needs continued PT services  PT Problem List Decreased range of motion;Decreased strength;Decreased activity tolerance;Decreased mobility;Decreased balance;Decreased safety awareness;Decreased knowledge of precautions       PT Treatment Interventions DME instruction;Gait training;Stair training;Functional mobility training;Therapeutic activities;Therapeutic exercise;Balance training;Neuromuscular re-education;Patient/family education    PT Goals (Current goals can be found in the Care Plan section)  Acute Rehab PT Goals Patient Stated Goal: return home PT Goal Formulation: With patient Time For Goal Achievement: 12/14/22 Potential to Achieve Goals: Good    Frequency Min 1X/week     Co-evaluation   Reason for Co-Treatment: To address functional/ADL transfers PT goals addressed during session: Mobility/safety with mobility OT goals addressed during session: ADL's and self-care       AM-PAC PT "6 Clicks" Mobility  Outcome Measure Help needed turning from your back to your side while in a flat bed without using bedrails?: A Little Help needed moving from lying on your back to sitting on the side of a flat bed without using bedrails?: A Little Help needed moving to and from a bed to a chair (including a  wheelchair)?: A Little Help needed standing up from a chair using your arms (e.g., wheelchair or bedside chair)?: A Little Help needed to walk in hospital room?: A Little Help needed climbing 3-5 steps with a railing? : A Little 6 Click Score: 18    End of Session Equipment Utilized During Treatment: Gait belt Activity Tolerance: Patient tolerated treatment well Patient left: in bed;with call bell/phone within reach Nurse Communication: Mobility status (RN notified/aware pt seated EOB eating lunch) PT Visit Diagnosis: Other abnormalities of gait and mobility (R26.89)    Time: 1610-9604 PT Time Calculation (min) (ACUTE ONLY): 30 min   Charges:   PT Evaluation $PT Eval Low Complexity: 1 Low PT Treatments $Therapeutic Activity: 8-22 mins PT General Charges $$ ACUTE PT VISIT: 1 Visit        Aunesty Tyson, PT, SPT 4:16 PM,11/30/22

## 2022-11-30 NOTE — Plan of Care (Signed)
  Problem: Pain Management: Goal: Pain level will decrease with appropriate interventions Outcome: Progressing   

## 2022-11-30 NOTE — H&P (Signed)
Paper H&P to be scanned into permanent record. H&P reviewed. No significant changes noted.  

## 2022-12-01 ENCOUNTER — Encounter: Payer: Self-pay | Admitting: Orthopedic Surgery

## 2022-12-01 DIAGNOSIS — G4733 Obstructive sleep apnea (adult) (pediatric): Secondary | ICD-10-CM | POA: Diagnosis not present

## 2022-12-01 DIAGNOSIS — E119 Type 2 diabetes mellitus without complications: Secondary | ICD-10-CM | POA: Diagnosis not present

## 2022-12-01 DIAGNOSIS — M12811 Other specific arthropathies, not elsewhere classified, right shoulder: Secondary | ICD-10-CM | POA: Diagnosis not present

## 2022-12-01 DIAGNOSIS — M12819 Other specific arthropathies, not elsewhere classified, unspecified shoulder: Secondary | ICD-10-CM | POA: Diagnosis not present

## 2022-12-01 DIAGNOSIS — I10 Essential (primary) hypertension: Secondary | ICD-10-CM | POA: Diagnosis not present

## 2022-12-01 DIAGNOSIS — Z8673 Personal history of transient ischemic attack (TIA), and cerebral infarction without residual deficits: Secondary | ICD-10-CM | POA: Diagnosis not present

## 2022-12-01 DIAGNOSIS — Z85828 Personal history of other malignant neoplasm of skin: Secondary | ICD-10-CM | POA: Diagnosis not present

## 2022-12-01 LAB — CBC
HCT: 41.4 % (ref 36.0–46.0)
Hemoglobin: 13.3 g/dL (ref 12.0–15.0)
MCH: 29.3 pg (ref 26.0–34.0)
MCHC: 32.1 g/dL (ref 30.0–36.0)
MCV: 91.2 fL (ref 80.0–100.0)
Platelets: 252 10*3/uL (ref 150–400)
RBC: 4.54 MIL/uL (ref 3.87–5.11)
RDW: 12.9 % (ref 11.5–15.5)
WBC: 9 10*3/uL (ref 4.0–10.5)
nRBC: 0 % (ref 0.0–0.2)

## 2022-12-01 LAB — GLUCOSE, CAPILLARY
Glucose-Capillary: 177 mg/dL — ABNORMAL HIGH (ref 70–99)
Glucose-Capillary: 214 mg/dL — ABNORMAL HIGH (ref 70–99)

## 2022-12-01 LAB — BASIC METABOLIC PANEL
Anion gap: 6 (ref 5–15)
BUN: 13 mg/dL (ref 8–23)
CO2: 23 mmol/L (ref 22–32)
Calcium: 8.3 mg/dL — ABNORMAL LOW (ref 8.9–10.3)
Chloride: 106 mmol/L (ref 98–111)
Creatinine, Ser: 0.84 mg/dL (ref 0.44–1.00)
GFR, Estimated: >60 mL/min (ref >60)
Glucose, Bld: 176 mg/dL — ABNORMAL HIGH (ref 70–99)
Potassium: 4.4 mmol/L (ref 3.5–5.1)
Sodium: 135 mmol/L (ref 135–145)

## 2022-12-01 MED ORDER — ONDANSETRON HCL 4 MG PO TABS: 4.0000 mg | ORAL_TABLET | Freq: Four times a day (QID) | ORAL | 0 refills | Status: AC | PRN

## 2022-12-01 MED ORDER — PANTOPRAZOLE SODIUM 40 MG PO TBEC
DELAYED_RELEASE_TABLET | ORAL | Status: AC
  Filled 2022-12-01: qty 2

## 2022-12-01 MED ORDER — LORATADINE 10 MG PO TABS
ORAL_TABLET | ORAL | Status: AC
  Filled 2022-12-01: qty 1

## 2022-12-01 MED ORDER — HYDROCHLOROTHIAZIDE 25 MG PO TABS
ORAL_TABLET | ORAL | Status: AC
  Filled 2022-12-01: qty 1

## 2022-12-01 MED ORDER — ASPIRIN 325 MG PO TBEC: 325.0000 mg | DELAYED_RELEASE_TABLET | Freq: Every day | ORAL | Status: AC

## 2022-12-01 MED ORDER — OXYCODONE HCL 5 MG PO TABS
ORAL_TABLET | ORAL | Status: AC
  Filled 2022-12-01: qty 1

## 2022-12-01 MED ORDER — CLOPIDOGREL BISULFATE 75 MG PO TABS
ORAL_TABLET | ORAL | Status: AC
  Filled 2022-12-01: qty 1

## 2022-12-01 MED ORDER — DOCUSATE SODIUM 100 MG PO CAPS
ORAL_CAPSULE | ORAL | Status: AC
  Filled 2022-12-01: qty 1

## 2022-12-01 MED ORDER — CEFAZOLIN SODIUM-DEXTROSE 2-4 GM/100ML-% IV SOLN
INTRAVENOUS | Status: AC
  Filled 2022-12-01: qty 100

## 2022-12-01 MED ORDER — INSULIN ASPART 100 UNIT/ML IJ SOLN
INTRAMUSCULAR | Status: AC
  Filled 2022-12-01: qty 1

## 2022-12-01 MED ORDER — ROSUVASTATIN CALCIUM 20 MG PO TABS
ORAL_TABLET | ORAL | Status: AC
  Filled 2022-12-01: qty 1

## 2022-12-01 MED ORDER — OXYCODONE HCL 5 MG PO TABS: 5.0000 mg | ORAL_TABLET | ORAL | 0 refills | Status: DC | PRN

## 2022-12-01 MED ORDER — ASPIRIN 325 MG PO TBEC
DELAYED_RELEASE_TABLET | ORAL | Status: AC
  Filled 2022-12-01: qty 1

## 2022-12-01 MED ORDER — ACETAMINOPHEN 500 MG PO TABS
ORAL_TABLET | ORAL | Status: AC
  Filled 2022-12-01: qty 2

## 2022-12-01 NOTE — Plan of Care (Signed)
  Problem: Metabolic: Goal: Ability to maintain appropriate glucose levels will improve Outcome: Progressing   Problem: Education: Goal: Knowledge of the prescribed therapeutic regimen will improve Outcome: Progressing Goal: Understanding of activity limitations/precautions following surgery will improve Outcome: Progressing   Problem: Activity: Goal: Ability to tolerate increased activity will improve Outcome: Progressing

## 2022-12-01 NOTE — Progress Notes (Addendum)
Physical Therapy Treatment Patient Details Name: Tracey Russo MRN: [id number] DOB: 05-Sep-1950 Today's Date: 12/01/2022   History of Present Illness Tracey Russo is a 72 y.o. female with chronic shoulder pain with inability to lift arm overhead. Imaging was consistent with massive, irreparable rotator cuff tear. S/p R reverse TSA with biceps tenodesis. PMH: HTN, hx of CVA    PT Comments  Pt presents seated EOB with husband in room, no complaints of pain. She performed sit<>stand with supervision and VC from PT for hand placement. She tolerated ambulating ~382ft with CGA and no AD, voicing 3 bouts of dizziness/ light headedness at last ~137ft that resolved with standing rest breaks. Pt able to ascend/descend 4 stairs with 1 rail on L and CGA. PT educated on awareness of RUE in space to avoid bumping into walls/objects as well as strategies if intermittent dizziness persists with mobility at home. She would benefit from continued skilled therapy to maximize functional abilities.    If plan is discharge home, recommend the following: A little help with walking and/or transfers;A little help with bathing/dressing/bathroom;Assistance with cooking/housework;Direct supervision/assist for medications management;Help with stairs or ramp for entrance;Assist for transportation   Can travel by private vehicle        Equipment Recommendations  None recommended by PT    Recommendations for Other Services       Precautions / Restrictions Precautions Precautions: None Required Braces or Orthoses: Sling Restrictions Weight Bearing Restrictions: Yes RUE Weight Bearing: Non weight bearing     Mobility  Bed Mobility               General bed mobility comments: not observed during session    Transfers Overall transfer level: Needs assistance   Transfers: Sit to/from Stand Sit to Stand: Supervision           General transfer comment: PT verbal cueing for hand placement, no physical  assist needed    Ambulation/Gait Ambulation/Gait assistance: Contact guard assist Gait Distance (Feet): 300 Feet Assistive device: None   Gait velocity: decreased     General Gait Details: required reminder from therapist for avoiding bumping RUE into wall/objects, some dizziness/lightheadedness noted at last ~11ft requiring 3 standing rest breaks to resolve   Stairs Stairs: Yes Stairs assistance: Contact guard assist Stair Management: One rail Left Number of Stairs: 4 General stair comments: ascend/descend   Wheelchair Mobility     Tilt Bed    Modified Rankin (Stroke Patients Only)       Balance Overall balance assessment: Needs assistance Sitting-balance support: No upper extremity supported, Feet supported Sitting balance-Leahy Scale: Normal     Standing balance support: No upper extremity supported, During functional activity Standing balance-Leahy Scale: Good                              Cognition Arousal: Alert Behavior During Therapy: WFL for tasks assessed/performed Overall Cognitive Status: Within Functional Limits for tasks assessed                                          Exercises      General Comments General comments (skin integrity, edema, etc.): O2 assessed during ambulation: 95%      Pertinent Vitals/Pain Pain Assessment Pain Assessment: No/denies pain    Home Living  Prior Function            PT Goals (current goals can now be found in the care plan section) Acute Rehab PT Goals Patient Stated Goal: return home Progress towards PT goals: Progressing toward goals    Frequency    Min 1X/week      PT Plan      Co-evaluation              AM-PAC PT "6 Clicks" Mobility   Outcome Measure  Help needed turning from your back to your side while in a flat bed without using bedrails?: A Little Help needed moving from lying on your back to sitting on the  side of a flat bed without using bedrails?: A Little Help needed moving to and from a bed to a chair (including a wheelchair)?: A Little Help needed standing up from a chair using your arms (e.g., wheelchair or bedside chair)?: A Little Help needed to walk in hospital room?: A Little Help needed climbing 3-5 steps with a railing? : A Little 6 Click Score: 18    End of Session Equipment Utilized During Treatment: Gait belt Activity Tolerance: Patient tolerated treatment well Patient left: in bed;with call bell/phone within reach Nurse Communication: Mobility status PT Visit Diagnosis: Other abnormalities of gait and mobility (R26.89)     Time: 4132-4401 PT Time Calculation (min) (ACUTE ONLY): 11 min  Charges:    $Therapeutic Activity: 8-22 mins PT General Charges $$ ACUTE PT VISIT: 1 Visit                     Tavyn Kurka, PT, SPT 11:17 AM,12/01/22

## 2022-12-01 NOTE — Progress Notes (Signed)
DISCHARGE NOTE:  Pt given discharge instructions and verbalized understanding. Shoulder immobilizer on, BSC sent with pt. Pt wheeled to car by staff, husband providing transportation home.

## 2022-12-01 NOTE — Plan of Care (Signed)
  Problem: Activity: Goal: Ability to tolerate increased activity will improve Outcome: Progressing   Problem: Pain Management: Goal: Pain level will decrease with appropriate interventions Outcome: Progressing   

## 2022-12-01 NOTE — Discharge Instructions (Signed)
Tracey H. Patel, MD  [facility]  Phone: [phone]  Fax: [phone]   Discharge Instructions after Reverse Shoulder Replacement    1. Activity/Sling: You are to be non-weight bearing on operative extremity. A sling/shoulder immobilizer has been provided for you. Only remove the sling to perform elbow, wrist, and hand RoM exercises and hygiene/dressing. Active reaching and lifting are not permitted. You will be given further instructions on sling use at your first physical therapy visit and postoperative visit with Dr. Patel.   2. Dressings: Dressing may be removed at 1st physical therapy visit (~3-4 days after surgery). Afterwards, you may either leave open to air (if no drainage) or cover with dry, sterile dressing. If you have steri-strips on your wound, please do not remove them. They will fall off on their own. You may shower 5 days after surgery. Please pat incision dry. Do not rub or place any shear forces across incision. If there is drainage or any opening of incision after 5 days, please notify our offices immediately.    3. Driving:  Plan on not driving for six weeks. Please note that you are advised NOT to drive while taking narcotic pain medications as you may be impaired and unsafe to drive.   4. Medications:  - You have been provided a prescription for narcotic pain medicine (usually oxycodone). After surgery, take 1-2 narcotic tablets every 4 hours if needed for severe pain. Please start this as soon as you begin to start having pain (if you received a nerve block, start taking as soon as this wears off).  - A prescription for anti-nausea medication will be provided in case the narcotic medicine causes nausea - take 1 tablet every 6 hours only if nauseated.  - Take enteric coated aspirin 325 mg once daily for 6 weeks to prevent blood clots. Do not take aspirin if you have an aspirin sensitivity/allergy or asthma or are on an anticoagulant (blood thinner) already. If so, then  your home anticoagulant will be resume and managed - do not take aspirin. -Take tylenol 1000mg (2 Extra strength or 3 regular strength tablets) every 8 hours for pain. This will reduce the amount of narcotic medication needed. May stop tylenol when you are having minimal pain. - Take a stool softener (Colace, Dulcolax or Senakot) if you are using narcotic pain medications to help with constipation that is associated with narcotic use. - DO NOT take ANY nonsteroidal anti-inflammatory pain medications: Advil, Motrin, Ibuprofen, Aleve, Naproxen, or Naprosyn.   If you are taking prescription medication for anxiety, depression, insomnia, muscle spasm, chronic pain, or for attention deficit disorder you are advised that you are at a higher risk of adverse effects with use of narcotics post-op, including narcotic addiction/dependence, depressed breathing, death. If you use non-prescribed substances: alcohol, marijuana, cocaine, heroin, methamphetamines, etc., you are at a higher risk of adverse effects with use of narcotics post-op, including narcotic addiction/dependence, depressed breathing, death. You are advised that taking > 50 morphine milligram equivalents (MME) of narcotic pain medication per day results in twice the risk of overdose or death. For your prescription provided: oxycodone 5 mg - taking more than 6 tablets per day after the first few days of surgery.   5. Physical Therapy: 1-2 times per week for ~12 weeks. Therapy typically starts on post operative Day 3 or 4. You have been provided an order for physical therapy. The therapist will provide home exercises. Please contact our offices if this appointment has not been scheduled.      6. Work: May do light duty/desk job in approximately 2 weeks when off of narcotics, pain is well-controlled, and swelling has decreased if able to function with one arm in sling. Full work may take 6 weeks if light motions and function of both arms is required.  Lifting jobs may require 12 weeks.   7. Post-Op Appointments: Your first post-op appointment will be with Dr. Patel in approximately 2 weeks time.    If you find that they have not been scheduled please call the Orthopaedic Appointment front desk at [phone].                               Tracey H. Patel, MD [facility] Phone: [phone] Fax: [phone]   REVERSE SHOULDER ARTHROPLASTY REHAB GUIDELINES   These guidelines should be tailored to individual patients based on their rehab goals, age, precautions, quality of repair, etc.  Progression should be based on patient progress and approval by the referring physician.  PHASE 1 - Day 1 through Week 2  GENERAL GUIDELINES AND PRECAUTIONS Sling wear 24/7 except during grooming and home exercises (3 to 5 times daily) Avoid shoulder extension such that the arm is posterior the frontal plane.  When patients recline, a pillow should be placed behind the upper arm and sling should be on.  They should be advised to always be able to see the elbow Avoid combined IR/ADD/EXT, such as hand behind back to prevent dislocation Avoid combined IR and ADD such as reaching across the chest to prevent dislocation No AROM No submersion in pool/water for 4 weeks No weight bearing through operative arm (as in transfers, walker use, etc.)  GOALS Maintain integrity of joint replacement; protect soft tissue healing Increase PROM for elevation to 120 and ER to 30 (will remain the goal for first 6 weeks) Optimize distal UE circulation and muscle activity (elbow, wrist and hand) Instruct in use of sling for proper fit, polar care device for ice application after HEP, signs/symptoms of infection  EXERCISES Active elbow, wrist and hand Passive forward elevation in scapular plane to 90-120 max motion; ER in scapular plane to 30 Active scapular retraction with arms resting in neutral position  CRITERIA TO PROGRESS TO  PHASE 2 Low pain (less than 3/10) with shoulder PROM Healing of incision without signs of infection Clearance by MD to advance after 2 week MD check up  PHASE 2 - 2 weeks - 6 weeks  GENERAL GUIDELINES AND PRECAUTIONS Sling may be removed while at home; worn in community without abduction pillow May use arm for light activities of daily living (such as feeding, brushing teeth, dressing.) with elbow near  the side of the body  and arm in front of the body- no active lifting of the arm May submerge in water (tub, pool, Jacuzzi, etc.) after 4 weeks Continue to avoid WBing through the operative arm Continue to avoid combined IR/EXT/ADD (hand behind the back) and IR/ADD  (reaching across chest) for dislocation precautions  GOALS  Achieve passive elevation to 120 and ER to 30  Low (less than 3/10) to no pain  Ability to fire all heads of the deltoid  EXERCISES May discontinue grip, and active elbow and wrist exercises since using the arm in ADL's  with sling removed around the home Continue passive elevation to 120 and ER to 30, both in scapular plane with arm supported on table top Add submaximal isometrics, pain free effort, for all   functional heads of deltoid (anterior, posterior, middle)  Ensure that with posterior deltoid isometric the shoulder does not move into extension and the arm remains anterior the frontal plane At 4 weeks:  begin to place arm in balanced position of 90 deg elevation in supine; when patient able to hold this position with ease, may begin reverse pendulums clockwise and counterclockwise  CRITERIA TO PROGRESS TO PHASE 3 Passive forward elevation in scapular plane to 120; passive ER in scapular plane to 30 Ability to fire isometrically all heads of the deltoid muscle without pain Ability to place and hold the arm in balanced position (90 deg elevation in supine)  PHASE 3 - 6 weeks to 3 months  GENERAL GUIDELINES AND PRECAUTIONS Discontinue use of sling Avoid  forcing end range motion in any direction to prevent dislocation  May advance use of the arm actively in ADL's without being restricted to arm by the side of the body, however, avoid heavy lifting and sports (forever!) May initiate functional IR behind the back gently NO UPPER BODY ERGOMETER   GOALS Optimize PROM for elevation and ER in scapular plane with realistic expectation that max  mobility for elevation is usually around 145-160 passively; ER 40 to 50 passively; functional IR to L1 Recover AROM to approach as close to PROM available as possible; may expect 135-150 deg active elevation; 30 deg active ER; active functional IR to L1 Establish dynamic stability of the shoulder with deltoid and periscapular muscle gradual strengthening  EXERCISES Forward elevation in scapular plane active progression: supine to incline, to vertical; short to long lever arm Balanced position long lever arm AROM Active ER/IR with arm at side Scapular retraction with light band resistance Functional IR with hand slide up back - very gentle and gradual NO UPPER BODY ERGOMETER     CRITERIA TO PROGRESS TO PHASE 4  AROM equals/approaches PROM with good mechanics for elevation   No pain  Higher level demand on shoulder than ADL functions   PHASE 4 12 months and beyond  GENERAL GUIDELINES AND PRECAUTIONS No heavy lifting and no overhead sports No heavy pushing activity Gradually increase strength of deltoid and scapular stabilizers; also the rotator cuff if present with weights not to exceed 5 lbs NO UPPER BODY ERGOMETER   GOALS  Optimize functional use of the operative UE to meet the desired demands  Gradual increase in deltoid, scapular muscle, and rotator cuff strength  Pain free functional activities   EXERCISES Add light hand weights for deltoid up to and not to exceed 3 lbs for anterior and posterior with long arm lift against gravity; elbow bent to 90 deg for abduction in scapular  plane Theraband progression for extension to hip with scapular depression/retraction Theraband progression for serratus anterior punches in supine; avoid wall, incline or prone pressups for serratus anterior End range stretching gently without forceful overpressure in all planes (elevation in scapular plane, ER in scapular plane, functional IR) with stretching done for life as part of a daily routine NO UPPER BODY ERGOMETER     CRITERIA FOR DISCHARGE FROM SKILLED PHYSICAL THERAPY  Pain free AROM for shoulder elevation (expect around 135-150)  Functional strength for all ADL's, work tasks, and hobbies approved by surgeon  Independence with home maintenance program   NOTES: 1. With proper exercise, motion, strength, and function continue to improve even after one year. 2. The complication rate after surgery is 5 - 8%. Complications include infection, fracture, heterotopic bone formation, nerve injury, instability, rotator cuff   tear, and tuberosity nonunion. Please look for clinical signs, unusual symptoms, or lack of progress with therapy and report those to Dr. Patel. Prefer more communication than less.  3. The therapy plan above only serves as a guide. Please be aware of specific individualized patient instructions as written on the prescription or through discussions with the surgeon. 4. Please call Dr. Patel if you have any specific questions or concerns [phone]    

## 2022-12-01 NOTE — Progress Notes (Signed)
  Subjective: 1 Day Post-Op Procedure(s) (LRB): Right reverse shoulder arthroplasty, biceps tenodesis (Right) Patient reports pain as mild.   Patient is well, and has had no acute complaints or problems Plan is to go Home after hospital stay. Negative for chest pain and shortness of breath Fever: no Gastrointestinal: Negative for nausea and vomiting  Objective: Vital signs in last 24 hours: Temp:  [96.7 F (35.9 C)-98.6 F (37 C)] 98.6 F (37 C) (09/11 0327) Pulse Rate:  [70-88] 88 (09/11 0619) Resp:  [12-21] 16 (09/11 0619) BP: (102-165)/(65-88) 121/83 (09/11 0619) SpO2:  [89 %-99 %] 93 % (09/11 0619)  Intake/Output from previous day:  Intake/Output Summary (Last 24 hours) at 12/01/2022 0708 Last data filed at 12/01/2022 0402 Gross per 24 hour  Intake 2910 ml  Output 260 ml  Net 2650 ml    Intake/Output this shift: No intake/output data recorded.  Labs: Recent Labs    12/01/22 0627  HGB 13.3   Recent Labs    12/01/22 0627  WBC 9.0  RBC 4.54  HCT 41.4  PLT 252   No results for input(s): "NA", "K", "CL", "CO2", "BUN", "CREATININE", "GLUCOSE", "CALCIUM" in the last 72 hours. No results for input(s): "LABPT", "INR" in the last 72 hours.   EXAM General - Patient is Alert and Oriented Extremity - Neurovascular intact Sensation intact distally Dorsiflexion/Plantar flexion intact Compartment soft Dressing/Incision - clean, dry, with the Hemovac removed with no complication Motor Function - intact, moving fingers and wrist well on exam.   Past Medical History:  Diagnosis Date   Allergy    Arthritis    Bilateral carotid artery stenosis    Cancer (HCC)    pre leukemia   Complication of anesthesia    had TIA after neck surgery   Depression    Diabetes mellitus without complication (HCC)    Family history of adverse reaction to anesthesia    father has CVA after bypass surgery   GERD (gastroesophageal reflux disease)    History of kidney stones     Hyperlipidemia    Hypertension    LVH (left ventricular hypertrophy) due to hypertensive disease, without heart failure    Migraines    Neuropathy due to type 2 diabetes mellitus (HCC)    OSA (obstructive sleep apnea)    Polycythemia    Rotator cuff arthropathy of right shoulder    Stroke (HCC)    after recovery had a TIA   Vitamin D deficiency     Assessment/Plan: 1 Day Post-Op Procedure(s) (LRB): Right reverse shoulder arthroplasty, biceps tenodesis (Right) Principal Problem:   Rotator cuff arthropathy  Estimated body mass index is 32.01 kg/m as calculated from the following:   Height as of this encounter: 5\' 6"  (1.676 m).   Weight as of this encounter: 89.9 kg. Advance diet Up with therapy D/C IV fluids  Discharge planning: Discharge the patient home with outpatient physical therapy.  Dressing changes needed.  Patient will follow-up at [facility] orthopedics in 2 weeks for x-rays of the right shoulder  DVT Prophylaxis - Aspirin Shoulder immobilizer on at all times except for bathing and range of motion exercises.  Dedra Skeens, PA-C Orthopaedic Surgery 12/01/2022, 7:08 AM

## 2022-12-01 NOTE — Progress Notes (Signed)
Patient is not able to walk the distance required to go the bathroom, or he/she is unable to safely negotiate stairs required to access the bathroom.  A 3in1 BSC will alleviate this problem  

## 2022-12-01 NOTE — Discharge Summary (Signed)
  Subjective: 1 Day Post-Op Procedure(s) (LRB): Right reverse shoulder arthroplasty, biceps tenodesis (Right) Patient reports pain as mild.   Patient is well, and has had no acute complaints or problems Plan is to go Home after hospital stay. Negative for chest pain and shortness of breath Fever: no Gastrointestinal: Negative for nausea and vomiting  Objective: Vital signs in last 24 hours: Temp:  [96.7 F (35.9 C)-98.6 F (37 C)] 98.6 F (37 C) (09/11 0327) Pulse Rate:  [70-88] 88 (09/11 0619) Resp:  [12-21] 16 (09/11 0619) BP: (102-165)/(65-88) 121/83 (09/11 0619) SpO2:  [89 %-99 %] 93 % (09/11 0619)  Intake/Output from previous day:  Intake/Output Summary (Last 24 hours) at 12/01/2022 0717 Last data filed at 12/01/2022 0402 Gross per 24 hour  Intake 2910 ml  Output 260 ml  Net 2650 ml    Intake/Output this shift: No intake/output data recorded.  Labs: Recent Labs    12/01/22 0627  HGB 13.3   Recent Labs    12/01/22 0627  WBC 9.0  RBC 4.54  HCT 41.4  PLT 252   Recent Labs    12/01/22 0627  NA 135  K 4.4  CL 106  CO2 23  BUN 13  CREATININE 0.84  GLUCOSE 176*  CALCIUM 8.3*   No results for input(s): "LABPT", "INR" in the last 72 hours.   EXAM General - Patient is Alert and Oriented Extremity - Neurovascular intact Sensation intact distally Compartment soft Dressing/Incision - clean, dry, with the Hemovac removed with no complication Motor Function - intact, moving fingers and wrist well on exam.   Past Medical History:  Diagnosis Date   Allergy    Arthritis    Bilateral carotid artery stenosis    Cancer (HCC)    pre leukemia   Complication of anesthesia    had TIA after neck surgery   Depression    Diabetes mellitus without complication (HCC)    Family history of adverse reaction to anesthesia    father has CVA after bypass surgery   GERD (gastroesophageal reflux disease)    History of kidney stones    Hyperlipidemia    Hypertension     LVH (left ventricular hypertrophy) due to hypertensive disease, without heart failure    Migraines    Neuropathy due to type 2 diabetes mellitus (HCC)    OSA (obstructive sleep apnea)    Polycythemia    Rotator cuff arthropathy of right shoulder    Stroke (HCC)    after recovery had a TIA   Vitamin D deficiency     Assessment/Plan: 1 Day Post-Op Procedure(s) (LRB): Right reverse shoulder arthroplasty, biceps tenodesis (Right) Principal Problem:   Rotator cuff arthropathy  Estimated body mass index is 32.01 kg/m as calculated from the following:   Height as of this encounter: 5\' 6"  (1.676 m).   Weight as of this encounter: 89.9 kg. Advance diet Up with therapy D/C IV fluids  DVT Prophylaxis - Aspirin and Plavix Shoulder immobilizer on at all times except for bathing and range of motion exercises.  Dedra Skeens, PA-C Orthopaedic Surgery 12/01/2022, 7:17 AM

## 2022-12-02 NOTE — Anesthesia Postprocedure Evaluation (Signed)
Anesthesia Post Note  Patient: TOCARRA SPIKES  Procedure(s) Performed: Right reverse shoulder arthroplasty, biceps tenodesis (Right: Shoulder)  Patient location during evaluation: PACU Anesthesia Type: General Level of consciousness: awake and alert Pain management: pain level controlled Vital Signs Assessment: post-procedure vital signs reviewed and stable Respiratory status: spontaneous breathing, nonlabored ventilation, respiratory function stable and patient connected to nasal cannula oxygen Cardiovascular status: blood pressure returned to baseline and stable Postop Assessment: no apparent nausea or vomiting Anesthetic complications: no   No notable events documented.   Last Vitals:  Vitals:   12/01/22 0725 12/01/22 0914  BP: 106/62 114/65  Pulse: 81 86  Resp: 16   Temp: 36.6 C   SpO2: 94% 93%    Last Pain:  Vitals:   12/01/22 0725  TempSrc: Oral  PainSc: 0-No pain                 Yevette Edwards

## 2022-12-06 DIAGNOSIS — M25511 Pain in right shoulder: Secondary | ICD-10-CM | POA: Diagnosis not present

## 2022-12-06 DIAGNOSIS — Z96611 Presence of right artificial shoulder joint: Secondary | ICD-10-CM | POA: Diagnosis not present

## 2022-12-14 DIAGNOSIS — G4733 Obstructive sleep apnea (adult) (pediatric): Secondary | ICD-10-CM | POA: Diagnosis not present

## 2022-12-15 DIAGNOSIS — M25511 Pain in right shoulder: Secondary | ICD-10-CM | POA: Diagnosis not present

## 2022-12-15 DIAGNOSIS — Z96611 Presence of right artificial shoulder joint: Secondary | ICD-10-CM | POA: Diagnosis not present

## 2022-12-23 DIAGNOSIS — M25511 Pain in right shoulder: Secondary | ICD-10-CM | POA: Diagnosis not present

## 2022-12-23 DIAGNOSIS — Z96611 Presence of right artificial shoulder joint: Secondary | ICD-10-CM | POA: Diagnosis not present

## 2022-12-29 DIAGNOSIS — Z96611 Presence of right artificial shoulder joint: Secondary | ICD-10-CM | POA: Diagnosis not present

## 2022-12-29 DIAGNOSIS — M25511 Pain in right shoulder: Secondary | ICD-10-CM | POA: Diagnosis not present

## 2023-01-06 DIAGNOSIS — Z96611 Presence of right artificial shoulder joint: Secondary | ICD-10-CM | POA: Diagnosis not present

## 2023-01-06 DIAGNOSIS — M25511 Pain in right shoulder: Secondary | ICD-10-CM | POA: Diagnosis not present

## 2023-01-13 ENCOUNTER — Ambulatory Visit (INDEPENDENT_AMBULATORY_CARE_PROVIDER_SITE_OTHER): Payer: Medicare PPO

## 2023-01-13 DIAGNOSIS — Z23 Encounter for immunization: Secondary | ICD-10-CM | POA: Diagnosis not present

## 2023-01-14 DIAGNOSIS — M25511 Pain in right shoulder: Secondary | ICD-10-CM | POA: Diagnosis not present

## 2023-01-14 DIAGNOSIS — Z96611 Presence of right artificial shoulder joint: Secondary | ICD-10-CM | POA: Diagnosis not present

## 2023-01-21 DIAGNOSIS — M25511 Pain in right shoulder: Secondary | ICD-10-CM | POA: Diagnosis not present

## 2023-01-21 DIAGNOSIS — Z96611 Presence of right artificial shoulder joint: Secondary | ICD-10-CM | POA: Diagnosis not present

## 2023-01-24 ENCOUNTER — Other Ambulatory Visit: Payer: Self-pay | Admitting: Family Medicine

## 2023-01-24 DIAGNOSIS — E785 Hyperlipidemia, unspecified: Secondary | ICD-10-CM | POA: Diagnosis not present

## 2023-01-24 DIAGNOSIS — J301 Allergic rhinitis due to pollen: Secondary | ICD-10-CM

## 2023-01-24 DIAGNOSIS — Z794 Long term (current) use of insulin: Secondary | ICD-10-CM | POA: Diagnosis not present

## 2023-01-24 DIAGNOSIS — E1169 Type 2 diabetes mellitus with other specified complication: Secondary | ICD-10-CM | POA: Diagnosis not present

## 2023-01-24 DIAGNOSIS — K219 Gastro-esophageal reflux disease without esophagitis: Secondary | ICD-10-CM

## 2023-01-24 DIAGNOSIS — E1159 Type 2 diabetes mellitus with other circulatory complications: Secondary | ICD-10-CM | POA: Diagnosis not present

## 2023-01-24 DIAGNOSIS — E559 Vitamin D deficiency, unspecified: Secondary | ICD-10-CM | POA: Diagnosis not present

## 2023-01-24 DIAGNOSIS — I152 Hypertension secondary to endocrine disorders: Secondary | ICD-10-CM | POA: Diagnosis not present

## 2023-01-24 DIAGNOSIS — E1142 Type 2 diabetes mellitus with diabetic polyneuropathy: Secondary | ICD-10-CM | POA: Diagnosis not present

## 2023-01-25 DIAGNOSIS — Z96611 Presence of right artificial shoulder joint: Secondary | ICD-10-CM | POA: Diagnosis not present

## 2023-01-25 DIAGNOSIS — M25511 Pain in right shoulder: Secondary | ICD-10-CM | POA: Diagnosis not present

## 2023-01-27 DIAGNOSIS — Z96611 Presence of right artificial shoulder joint: Secondary | ICD-10-CM | POA: Diagnosis not present

## 2023-01-31 DIAGNOSIS — M25511 Pain in right shoulder: Secondary | ICD-10-CM | POA: Diagnosis not present

## 2023-01-31 DIAGNOSIS — Z96611 Presence of right artificial shoulder joint: Secondary | ICD-10-CM | POA: Diagnosis not present

## 2023-02-03 DIAGNOSIS — M25511 Pain in right shoulder: Secondary | ICD-10-CM | POA: Diagnosis not present

## 2023-02-03 DIAGNOSIS — Z96611 Presence of right artificial shoulder joint: Secondary | ICD-10-CM | POA: Diagnosis not present

## 2023-02-07 DIAGNOSIS — Z96611 Presence of right artificial shoulder joint: Secondary | ICD-10-CM | POA: Diagnosis not present

## 2023-02-07 DIAGNOSIS — M25511 Pain in right shoulder: Secondary | ICD-10-CM | POA: Diagnosis not present

## 2023-02-14 DIAGNOSIS — Z96611 Presence of right artificial shoulder joint: Secondary | ICD-10-CM | POA: Diagnosis not present

## 2023-02-14 DIAGNOSIS — M25511 Pain in right shoulder: Secondary | ICD-10-CM | POA: Diagnosis not present

## 2023-02-24 DIAGNOSIS — Z96611 Presence of right artificial shoulder joint: Secondary | ICD-10-CM | POA: Diagnosis not present

## 2023-02-24 DIAGNOSIS — M25511 Pain in right shoulder: Secondary | ICD-10-CM | POA: Diagnosis not present

## 2023-03-01 ENCOUNTER — Encounter: Payer: Self-pay | Admitting: Family Medicine

## 2023-03-01 ENCOUNTER — Ambulatory Visit: Payer: Medicare PPO | Admitting: Family Medicine

## 2023-03-01 VITALS — BP 114/76 | HR 83 | Ht 66.0 in | Wt 201.0 lb

## 2023-03-01 DIAGNOSIS — E785 Hyperlipidemia, unspecified: Secondary | ICD-10-CM

## 2023-03-01 DIAGNOSIS — I679 Cerebrovascular disease, unspecified: Secondary | ICD-10-CM

## 2023-03-01 DIAGNOSIS — I10 Essential (primary) hypertension: Secondary | ICD-10-CM

## 2023-03-01 MED ORDER — METOPROLOL SUCCINATE ER 50 MG PO TB24
ORAL_TABLET | ORAL | 1 refills | Status: DC
Start: 2023-03-01 — End: 2023-08-16

## 2023-03-01 MED ORDER — CLOPIDOGREL BISULFATE 75 MG PO TABS
75.0000 mg | ORAL_TABLET | Freq: Every day | ORAL | 1 refills | Status: DC
Start: 2023-03-01 — End: 2023-08-16

## 2023-03-01 MED ORDER — HYDROCHLOROTHIAZIDE 12.5 MG PO CAPS
12.5000 mg | ORAL_CAPSULE | Freq: Every day | ORAL | 1 refills | Status: DC
Start: 2023-03-01 — End: 2023-08-16

## 2023-03-01 MED ORDER — ROSUVASTATIN CALCIUM 20 MG PO TABS
20.0000 mg | ORAL_TABLET | Freq: Every day | ORAL | 1 refills | Status: DC
Start: 2023-03-01 — End: 2023-08-16

## 2023-03-01 MED ORDER — AMLODIPINE BESYLATE 10 MG PO TABS
10.0000 mg | ORAL_TABLET | Freq: Every day | ORAL | 1 refills | Status: DC
Start: 2023-03-01 — End: 2023-08-16

## 2023-03-01 NOTE — Patient Instructions (Signed)

## 2023-03-01 NOTE — Addendum Note (Signed)
Addended by: Duanne Limerick on: 03/01/2023 02:03 PM   Modules accepted: Orders

## 2023-03-01 NOTE — Progress Notes (Addendum)
Date:  03/01/2023   Name:  Tracey Russo   DOB:  1951-02-24   MRN:  [id number]   Chief Complaint: Hyperlipidemia, Gastroesophageal Reflux, Hypertension, Depression, Cerebrovascular disease, and Osteoporosis  Hyperlipidemia This is a chronic problem. The current episode started more than 1 year ago. The problem is controlled. Recent lipid tests were reviewed and are normal. She has no history of chronic renal disease. There are no known factors aggravating her hyperlipidemia. Pertinent negatives include no chest pain, focal sensory loss, focal weakness, leg pain, myalgias or shortness of breath. Current antihyperlipidemic treatment includes statins. The current treatment provides moderate improvement of lipids. There are no compliance problems.   Gastroesophageal Reflux She complains of heartburn. She reports no abdominal pain, no belching, no chest pain, no choking, no coughing, no dysphagia, no early satiety, no sore throat or no wheezing. The current episode started more than 1 year ago. The problem occurs frequently. The problem has been gradually improving. The symptoms are aggravated by certain foods. Associated symptoms include fatigue. Pertinent negatives include no anemia, melena, muscle weakness, orthopnea or weight loss. She has tried a PPI for the symptoms. The treatment provided moderate relief.  Hypertension This is a chronic problem. The current episode started more than 1 year ago. The problem has been gradually improving since onset. The problem is controlled. Pertinent negatives include no blurred vision, chest pain, headaches, malaise/fatigue, orthopnea, palpitations, peripheral edema or shortness of breath. Past treatments include diuretics, beta blockers and calcium channel blockers. The current treatment provides moderate improvement. There are no compliance problems.  There is no history of chronic renal disease, a hypertension causing med or renovascular disease.   Depression        This is a chronic problem.  The current episode started more than 1 year ago.   The onset quality is gradual.   The problem occurs intermittently.  The problem has been waxing and waning since onset.  Associated symptoms include fatigue, insomnia and appetite change.  Associated symptoms include no decreased concentration, no helplessness, no hopelessness, not irritable, no restlessness, no decreased interest, no body aches, no myalgias, no headaches, no indigestion, not sad and no suicidal ideas.  Past treatments include SSRIs - Selective serotonin reuptake inhibitors.  Previous treatment provided moderate relief.   Lab Results  Component Value Date   NA 135 12/01/2022   K 4.4 12/01/2022   CO2 23 12/01/2022   GLUCOSE 176 (H) 12/01/2022   BUN 13 12/01/2022   CREATININE 0.84 12/01/2022   CALCIUM 8.3 (L) 12/01/2022   EGFR 64 02/26/2022   GFRNONAA >60 12/01/2022   Lab Results  Component Value Date   CHOL 254 (A) 07/01/2020   HDL 39 07/01/2020   LDLCALC 181 07/01/2020   TRIG 166 (A) 07/01/2020   CHOLHDL 2.5 06/02/2017   No results found for: "TSH" Lab Results  Component Value Date   HGBA1C 7.8 (H) 11/23/2022   Lab Results  Component Value Date   WBC 9.0 12/01/2022   HGB 13.3 12/01/2022   HCT 41.4 12/01/2022   MCV 91.2 12/01/2022   PLT 252 12/01/2022   Lab Results  Component Value Date   ALT 14 11/23/2022   AST 15 11/23/2022   ALKPHOS 60 11/23/2022   BILITOT 0.8 11/23/2022   No results found for: "25OHVITD2", "25OHVITD3", "VD25OH"   Review of Systems  Constitutional:  Positive for appetite change and fatigue. Negative for malaise/fatigue and weight loss.  HENT:  Negative for congestion, sinus pressure and  sore throat.   Eyes:  Negative for blurred vision and visual disturbance.  Respiratory:  Negative for cough, choking, shortness of breath and wheezing.   Cardiovascular:  Negative for chest pain, palpitations, orthopnea and leg swelling.   Gastrointestinal:  Positive for heartburn. Negative for abdominal pain, blood in stool, constipation, diarrhea, dysphagia and melena.       Bloating  Endocrine: Negative for polydipsia and polyuria.  Genitourinary:  Negative for difficulty urinating.  Musculoskeletal:  Negative for myalgias and muscle weakness.  Neurological:  Negative for focal weakness and headaches.  Psychiatric/Behavioral:  Positive for depression. Negative for decreased concentration and suicidal ideas. The patient has insomnia.     Patient Active Problem List   Diagnosis Date Noted   Rotator cuff arthropathy 11/30/2022   Glenohumeral arthritis, right 01/13/2021   Rotator cuff arthropathy, right 12/23/2020   LVH (left ventricular hypertrophy) due to hypertensive disease, without heart failure 08/15/2018   Obesity (BMI 30-39.9) 05/02/2018   Vitamin D deficiency 02/03/2018   Stable angina pectoris (HCC) 12/13/2017   Bilateral carotid artery stenosis 12/13/2017   Recurrent major depressive disorder (HCC) 10/17/2017   Mixed hyperlipidemia 12/22/2015   Acute seasonal allergic rhinitis 12/22/2015   Acute maxillary sinusitis 12/22/2015   Incomplete bladder emptying 02/05/2014   Dyslipidemia 08/09/2013   Gastroesophageal reflux disease 08/09/2013   Essential hypertension 08/09/2013   Diabetes mellitus, type 2 (HCC) 08/09/2013   Renal cyst, acquired, left 09/11/2012   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 09/11/2012   Urge incontinence 09/11/2012   Frank hematuria 05/08/2012   Mixed incontinence 05/08/2012   Renal colic 05/08/2012    Allergies  Allergen Reactions   Ace Inhibitors Cough   Corticosteroids     Other reaction(s): OTHER   Nsaids     Other reaction(s): ANAPHYLAXIS    Past Surgical History:  Procedure Laterality Date   CERVICAL FUSION  12/07/2000   COLONOSCOPY  03/22/2004   Dr Mechele Collin   COLONOSCOPY WITH PROPOFOL N/A 07/30/2020   Procedure: COLONOSCOPY WITH PROPOFOL;  Surgeon: [name], Boykin Nearing,  MD;  Location: [facility] ENDOSCOPY;  Service: Gastroenterology;  Laterality: N/A;  IDDM   CYSTOSTOMY  09/19/1984   REVERSE SHOULDER ARTHROPLASTY Right 11/30/2022   Procedure: Right reverse shoulder arthroplasty, biceps tenodesis;  Surgeon: Signa Kell, MD;  Location: [facility] ORS;  Service: Orthopedics;  Laterality: Right;   SHOULDER SURGERY Right 11/21/2006   tumor removed   SUBDURAL HEMATOMA EVACUATION VIA CRANIOTOMY  04/16/2007   TONSILECTOMY, ADENOIDECTOMY, BILATERAL MYRINGOTOMY AND TUBES  03/22/2001   TUMOR REMOVAL     in throat   VAGINAL HYSTERECTOMY  02/23/2000    Social History   Tobacco Use   Smoking status: Former    Current packs/day: 0.25    Average packs/day: 0.3 packs/day for 3.0 years (0.8 ttl pk-yrs)    Types: Cigarettes   Smokeless tobacco: Never  Vaping Use   Vaping status: Never Used  Substance Use Topics   Alcohol use: Not Currently    Alcohol/week: 0.0 standard drinks of alcohol   Drug use: Never     Medication list has been reviewed and updated.  Current Meds  Medication Sig   alendronate (FOSAMAX) 70 MG tablet Take 1 tablet (70 mg total) by mouth every 7 (seven) days. Take with a full glass of water on an empty stomach.   amLODipine (NORVASC) 10 MG tablet Take 1 tablet (10 mg total) by mouth daily.   calcium-vitamin D (OSCAL WITH D) 500-200 MG-UNIT tablet Take 1 tablet by mouth daily.  citalopram (CELEXA) 20 MG tablet Take 2 tablets (40 mg total) by mouth daily.   clopidogrel (PLAVIX) 75 MG tablet Take 1 tablet (75 mg total) by mouth daily.   cromolyn (NASALCROM) 5.2 MG/ACT nasal spray Place 1 spray into both nostrils as needed for allergies.   Cyanocobalamin 1000 MCG TBCR Take 1 tablet by mouth daily.   EQ ALLERGY RELIEF, CETIRIZINE, 10 MG tablet Take 1 tablet by mouth once daily   esomeprazole (NEXIUM) 40 MG capsule Take 1 capsule by mouth once daily   gabapentin (NEURONTIN) 300 MG capsule Take 1 capsule by mouth every 6 (six) hours as needed. endo    glucose blood (ACCU-CHEK AVIVA PLUS) test strip USE ONE STRIP TO CHECK GLUCOSE THREE TIMES DAILY AS  DIRECTED   hydrochlorothiazide (MICROZIDE) 12.5 MG capsule Take 1 capsule (12.5 mg total) by mouth daily.   insulin aspart (NOVOLOG) 100 UNIT/ML injection USE UP TO 60 UNITS PER DAY IN VGO PUMP AS DIRECTED   Insulin Disposable Pump (V-GO 40) KIT    melatonin 5 MG TABS Take 10 mg by mouth at bedtime.   metoprolol succinate (TOPROL-XL) 50 MG 24 hr tablet TAKE 1 TABLET BY MOUTH ONCE DAILY TAKE  WITH  OR  IMMEDIATLY  FOLLOWING  A  MEAL   mirabegron ER (MYRBETRIQ) 50 MG TB24 tablet Take 1 tablet (50 mg total) by mouth daily.   naproxen (NAPROSYN) 500 MG tablet Take 500 mg by mouth 2 (two) times daily with a meal.   ondansetron (ZOFRAN) 4 MG tablet Take 1 tablet (4 mg total) by mouth every 6 (six) hours as needed for nausea.   OZEMPIC, 0.25 OR 0.5 MG/DOSE, 2 MG/1.5ML SOPN Inject into the skin. Weekly   rosuvastatin (CRESTOR) 20 MG tablet Take 1 tablet (20 mg total) by mouth daily.   SYNJARDY XR 12.07-998 MG TB24 Take 1 tablet by mouth daily.   TRESIBA FLEXTOUCH 100 UNIT/ML FlexTouch Pen Inject 27 Units into the skin daily.   Vitamin D, Ergocalciferol, (DRISDOL) 1.25 MG (50000 UNIT) CAPS capsule Take 1 capsule by mouth once a week.   [DISCONTINUED] oxyCODONE (OXY IR/ROXICODONE) 5 MG immediate release tablet Take 1-2 tablets (5-10 mg total) by mouth every 4 (four) hours as needed for moderate pain (pain score 4-6).       03/01/2023    1:37 PM 08/30/2022    1:38 PM 04/19/2022    2:18 PM 02/26/2022    1:46 PM  GAD 7 : Generalized Anxiety Score  Nervous, Anxious, on Edge 0 1 0 0  Control/stop worrying 0 0 0 0  Worry too much - different things 0 0 0 0  Trouble relaxing 0 1 0 0  Restless 0 1 0 0  Easily annoyed or irritable 0 3 0 0  Afraid - awful might happen 0 0 0 0  Total GAD 7 Score 0 6 0 0  Anxiety Difficulty Not difficult at all Very difficult Not difficult at all Not difficult at all        03/01/2023    1:37 PM 09/01/2022    3:38 PM 08/30/2022    1:37 PM  Depression screen PHQ 2/9  Decreased Interest 0 0 3  Down, Depressed, Hopeless 0 0 1  PHQ - 2 Score 0 0 4  Altered sleeping 3 0 3  Tired, decreased energy 3 0 2  Change in appetite 3 0 2  Feeling bad or failure about yourself  0 0 1  Trouble concentrating 0 0 1  Moving slowly or fidgety/restless 0 0 1  Suicidal thoughts 0 0 1  PHQ-9 Score 9 0 15  Difficult doing work/chores Not difficult at all Not difficult at all Extremely dIfficult    BP Readings from Last 3 Encounters:  03/01/23 114/76  12/01/22 114/65  11/23/22 120/86    Physical Exam Vitals and nursing note reviewed. Exam conducted with a chaperone present.  Constitutional:      General: She is not irritable.She is not in acute distress.    Appearance: She is not diaphoretic.  HENT:     Head: Normocephalic and atraumatic.     Right Ear: Tympanic membrane and external ear normal.     Left Ear: Tympanic membrane and external ear normal.     Nose: Nose normal.     Mouth/Throat:     Mouth: Mucous membranes are moist.  Eyes:     General:        Right eye: No discharge.        Left eye: No discharge.     Conjunctiva/sclera: Conjunctivae normal.     Pupils: Pupils are equal, round, and reactive to light.  Neck:     Thyroid: No thyromegaly.     Vascular: No JVD.  Cardiovascular:     Rate and Rhythm: Normal rate and regular rhythm.     Heart sounds: Normal heart sounds. No murmur heard.    No friction rub. No gallop.  Pulmonary:     Effort: Pulmonary effort is normal.     Breath sounds: Normal breath sounds. No wheezing, rhonchi or rales.  Abdominal:     General: Bowel sounds are normal.     Palpations: Abdomen is soft. There is no mass.     Tenderness: There is no abdominal tenderness. There is no guarding.  Musculoskeletal:        General: Normal range of motion.     Cervical back: Normal range of motion and neck supple.  Lymphadenopathy:      Cervical: No cervical adenopathy.  Skin:    General: Skin is warm and dry.  Neurological:     Mental Status: She is alert.     Deep Tendon Reflexes: Reflexes are normal and symmetric.     Wt Readings from Last 3 Encounters:  03/01/23 201 lb (91.2 kg)  11/30/22 198 lb 4.8 oz (89.9 kg)  11/23/22 198 lb 4.8 oz (89.9 kg)    BP 114/76   Pulse 83   Ht 5\' 6"  (1.676 m)   Wt 201 lb (91.2 kg)   SpO2 95%   BMI 32.44 kg/m   Assessment and Plan: 1. Dyslipidemia Chronic.  Controlled.  Stable.  Asymptomatic without myalgias or muscle weakness and currently tolerating rosuvastatin 20 mg once a day.  Will check lipid panel for current status of LDL.  Patient has been given low-cholesterol low triglyceride dietary guidelines.  Will recheck patient in 6 months. - rosuvastatin (CRESTOR) 20 MG tablet; Take 1 tablet (20 mg total) by mouth daily.  Dispense: 90 tablet; Refill: 1 - Lipid Panel With LDL/HDL Ratio  2. Essential hypertension Chronic.  Controlled.  Stable.  Blood pressure is 114/76.  Asymptomatic.  Tolerating medication well.  Continue triple therapy with metoprolol XL 50 mg once a day hydrochlorothiazide 12.5 mg once a day and amlodipine 10 mg once a day.  Review of basic metabolic panel per diabetic [place] visit is acceptable.  Patient will continue with current regimen and we will recheck in 6 months. - metoprolol succinate (  TOPROL-XL) 50 MG 24 hr tablet; TAKE 1 TABLET BY MOUTH ONCE DAILY TAKE  WITH  OR  IMMEDIATLY  FOLLOWING  A  MEAL  Dispense: 90 tablet; Refill: 1 - hydrochlorothiazide (MICROZIDE) 12.5 MG capsule; Take 1 capsule (12.5 mg total) by mouth daily.  Dispense: 90 capsule; Refill: 1 - amLODipine (NORVASC) 10 MG tablet; Take 1 tablet (10 mg total) by mouth daily.  Dispense: 90 tablet; Refill: 1  3. Cerebrovascular disease Chronic.  Controlled.  Stable.  Patient has history of cerebrovascular disease.  Currently is being controlled by control of blood pressure control of  cholesterol control of diabetes and other healthy lifestyle changes. - clopidogrel (PLAVIX) 75 MG tablet; Take 1 tablet (75 mg total) by mouth daily.  Dispense: 90 tablet; Refill: 1     Elizabeth Sauer, MD

## 2023-03-02 ENCOUNTER — Encounter: Payer: Self-pay | Admitting: Family Medicine

## 2023-03-02 LAB — LIPID PANEL WITH LDL/HDL RATIO
Cholesterol, Total: 243 mg/dL — ABNORMAL HIGH (ref 100–199)
HDL: 41 mg/dL (ref >39)
LDL Chol Calc (NIH): 174 mg/dL — ABNORMAL HIGH (ref 0–99)
LDL/HDL Ratio: 4.2 {ratio} — ABNORMAL HIGH (ref 0.0–3.2)
Triglycerides: 150 mg/dL — ABNORMAL HIGH (ref 0–149)
VLDL Cholesterol Cal: 28 mg/dL (ref 5–40)

## 2023-03-07 DIAGNOSIS — M25511 Pain in right shoulder: Secondary | ICD-10-CM | POA: Diagnosis not present

## 2023-03-07 DIAGNOSIS — Z96611 Presence of right artificial shoulder joint: Secondary | ICD-10-CM | POA: Diagnosis not present

## 2023-03-08 DIAGNOSIS — H2513 Age-related nuclear cataract, bilateral: Secondary | ICD-10-CM | POA: Diagnosis not present

## 2023-03-08 DIAGNOSIS — Z0100 Encounter for examination of eyes and vision without abnormal findings: Secondary | ICD-10-CM | POA: Diagnosis not present

## 2023-03-08 DIAGNOSIS — E119 Type 2 diabetes mellitus without complications: Secondary | ICD-10-CM | POA: Diagnosis not present

## 2023-03-08 DIAGNOSIS — H5203 Hypermetropia, bilateral: Secondary | ICD-10-CM | POA: Diagnosis not present

## 2023-03-08 LAB — HM DIABETES EYE EXAM

## 2023-03-14 DIAGNOSIS — G4733 Obstructive sleep apnea (adult) (pediatric): Secondary | ICD-10-CM | POA: Diagnosis not present

## 2023-04-07 DIAGNOSIS — Z96611 Presence of right artificial shoulder joint: Secondary | ICD-10-CM | POA: Diagnosis not present

## 2023-04-10 ENCOUNTER — Other Ambulatory Visit: Payer: Self-pay | Admitting: Family Medicine

## 2023-04-10 DIAGNOSIS — J301 Allergic rhinitis due to pollen: Secondary | ICD-10-CM

## 2023-04-10 DIAGNOSIS — F339 Major depressive disorder, recurrent, unspecified: Secondary | ICD-10-CM

## 2023-04-10 DIAGNOSIS — K219 Gastro-esophageal reflux disease without esophagitis: Secondary | ICD-10-CM

## 2023-06-03 ENCOUNTER — Other Ambulatory Visit: Payer: Self-pay | Admitting: Family Medicine

## 2023-06-03 DIAGNOSIS — K219 Gastro-esophageal reflux disease without esophagitis: Secondary | ICD-10-CM

## 2023-06-03 DIAGNOSIS — J301 Allergic rhinitis due to pollen: Secondary | ICD-10-CM

## 2023-06-28 ENCOUNTER — Telehealth: Payer: Self-pay | Admitting: Family Medicine

## 2023-06-28 NOTE — Telephone Encounter (Signed)
 Copied from CRM [id number]. Topic: General - Other >> Jun 28, 2023  1:17 PM Shardie S wrote: Reason for CRM: Patient is requesting a recommendation for a new provider. States that she had an unpleasant encounter with Dr. Rich Brave and would like a referral to a new provider. Patient requesting a callback.

## 2023-06-28 NOTE — Telephone Encounter (Signed)
 Called pt she is still Dr. Yetta Barre pt. She will come into her next appointment with Dr. Yetta Barre and will see another provider I this office afterwards. Dr.Liang  KP

## 2023-07-26 ENCOUNTER — Other Ambulatory Visit: Payer: Self-pay | Admitting: Family Medicine

## 2023-07-26 DIAGNOSIS — M810 Age-related osteoporosis without current pathological fracture: Secondary | ICD-10-CM

## 2023-08-16 ENCOUNTER — Encounter: Payer: Self-pay | Admitting: Family Medicine

## 2023-08-16 ENCOUNTER — Ambulatory Visit: Payer: Self-pay | Admitting: Family Medicine

## 2023-08-16 DIAGNOSIS — I10 Essential (primary) hypertension: Secondary | ICD-10-CM | POA: Diagnosis not present

## 2023-08-16 DIAGNOSIS — M810 Age-related osteoporosis without current pathological fracture: Secondary | ICD-10-CM | POA: Diagnosis not present

## 2023-08-16 DIAGNOSIS — F339 Major depressive disorder, recurrent, unspecified: Secondary | ICD-10-CM | POA: Diagnosis not present

## 2023-08-16 DIAGNOSIS — K219 Gastro-esophageal reflux disease without esophagitis: Secondary | ICD-10-CM

## 2023-08-16 DIAGNOSIS — J301 Allergic rhinitis due to pollen: Secondary | ICD-10-CM

## 2023-08-16 DIAGNOSIS — E785 Hyperlipidemia, unspecified: Secondary | ICD-10-CM

## 2023-08-16 DIAGNOSIS — I679 Cerebrovascular disease, unspecified: Secondary | ICD-10-CM

## 2023-08-16 MED ORDER — AMLODIPINE BESYLATE 10 MG PO TABS
10.0000 mg | ORAL_TABLET | Freq: Every day | ORAL | 1 refills | Status: DC
Start: 2023-08-16 — End: 2024-01-26

## 2023-08-16 MED ORDER — ALENDRONATE SODIUM 70 MG PO TABS: 70.0000 mg | ORAL_TABLET | ORAL | 0 refills | Status: AC

## 2023-08-16 MED ORDER — ESOMEPRAZOLE MAGNESIUM 40 MG PO CPDR
40.0000 mg | DELAYED_RELEASE_CAPSULE | Freq: Every day | ORAL | 1 refills | Status: AC
Start: 2023-08-16 — End: ?

## 2023-08-16 MED ORDER — CETIRIZINE HCL 10 MG PO TABS
10.0000 mg | ORAL_TABLET | Freq: Every day | ORAL | 1 refills | Status: DC
Start: 2023-08-16 — End: 2024-01-11

## 2023-08-16 MED ORDER — HYDROCHLOROTHIAZIDE 12.5 MG PO CAPS
12.5000 mg | ORAL_CAPSULE | Freq: Every day | ORAL | 1 refills | Status: DC
Start: 2023-08-16 — End: 2024-01-26

## 2023-08-16 MED ORDER — CLOPIDOGREL BISULFATE 75 MG PO TABS: 75.0000 mg | ORAL_TABLET | Freq: Every day | ORAL | 1 refills | Status: AC

## 2023-08-16 MED ORDER — ROSUVASTATIN CALCIUM 20 MG PO TABS
20.0000 mg | ORAL_TABLET | Freq: Every day | ORAL | 1 refills | Status: AC
Start: 2023-08-16 — End: ?

## 2023-08-16 MED ORDER — METOPROLOL SUCCINATE ER 50 MG PO TB24: ORAL_TABLET | ORAL | 1 refills | Status: DC

## 2023-08-16 MED ORDER — CITALOPRAM HYDROBROMIDE 20 MG PO TABS
40.0000 mg | ORAL_TABLET | Freq: Every day | ORAL | 1 refills | Status: AC
Start: 2023-08-16 — End: ?

## 2023-08-16 NOTE — Progress Notes (Signed)
 Date:  08/16/2023   Name:  Tracey Russo   DOB:  [birth year]   MRN:  [id number]   Chief Complaint: Hypertension  Hypertension This is a chronic problem. The current episode started more than 1 year ago. The problem has been gradually improving since onset. The problem is controlled. Associated symptoms include anxiety. Pertinent negatives include no blurred vision, chest pain, headaches, malaise/fatigue, neck pain, orthopnea, palpitations, peripheral edema, PND, shortness of breath or sweats. There are no associated agents to hypertension. Risk factors for coronary artery disease include dyslipidemia. Past treatments include calcium  channel blockers, diuretics and beta blockers. The current treatment provides moderate improvement. There are no compliance problems.  There is no history of CAD/MI. There is no history of chronic renal disease, a hypertension causing med or renovascular disease.  Hyperlipidemia This is a chronic problem. The current episode started more than 1 year ago. The problem is controlled. Recent lipid tests were reviewed and are normal. She has no history of chronic renal disease. Pertinent negatives include no chest pain or shortness of breath. Current antihyperlipidemic treatment includes statins. The current treatment provides moderate improvement of lipids. There are no compliance problems.  Risk factors for coronary artery disease include dyslipidemia and stress.  Depression        This is a chronic problem.  The current episode started more than 1 year ago.   The onset quality is gradual.   The problem occurs intermittently.  The problem has been gradually improving since onset.  Associated symptoms include decreased concentration, helplessness, irritable, decreased interest and sad.  Associated symptoms include no restlessness, no headaches and no suicidal ideas.  Past treatments include SSRIs - Selective serotonin reuptake inhibitors.  Compliance with treatment is good.   Previous treatment provided moderate relief.  Past medical history includes anxiety.   Anxiety Presents for follow-up visit. Symptoms include decreased concentration. Patient reports no chest pain, compulsions, dizziness, excessive worry, hyperventilation, irritability, muscle tension, nervous/anxious behavior, palpitations, restlessness, shortness of breath or suicidal ideas. Symptoms occur occasionally.      Lab Results  Component Value Date   NA 135 12/01/2022   K 4.4 12/01/2022   CO2 23 12/01/2022   GLUCOSE 176 (H) 12/01/2022   BUN 13 12/01/2022   CREATININE 0.84 12/01/2022   CALCIUM  8.3 (L) 12/01/2022   EGFR 64 02/26/2022   GFRNONAA >60 12/01/2022   Lab Results  Component Value Date   CHOL 243 (H) 03/01/2023   HDL 41 03/01/2023   LDLCALC 174 (H) 03/01/2023   TRIG 150 (H) 03/01/2023   CHOLHDL 2.5 06/02/2017   No results found for: "TSH" Lab Results  Component Value Date   HGBA1C 7.8 (H) 11/23/2022   Lab Results  Component Value Date   WBC 9.0 12/01/2022   HGB 13.3 12/01/2022   HCT 41.4 12/01/2022   MCV 91.2 12/01/2022   PLT 252 12/01/2022   Lab Results  Component Value Date   ALT 14 11/23/2022   AST 15 11/23/2022   ALKPHOS 60 11/23/2022   BILITOT 0.8 11/23/2022   No results found for: "25OHVITD2", "25OHVITD3", "VD25OH"   Review of Systems  Constitutional:  Negative for irritability and malaise/fatigue.  Eyes:  Negative for blurred vision and visual disturbance.  Respiratory:  Negative for cough, choking, chest tightness, shortness of breath and wheezing.   Cardiovascular:  Negative for chest pain, palpitations, orthopnea and PND.  Gastrointestinal:  Negative for abdominal distention, abdominal pain and blood in stool.  Endocrine: Negative for polydipsia  and polyuria.  Musculoskeletal:  Negative for neck pain.  Neurological:  Negative for dizziness and headaches.  Psychiatric/Behavioral:  Positive for decreased concentration and depression. Negative for  suicidal ideas. The patient is not nervous/anxious.     Patient Active Problem List   Diagnosis Date Noted   Rotator cuff arthropathy 11/30/2022   Glenohumeral arthritis, right 01/13/2021   Rotator cuff arthropathy, right 12/23/2020   LVH (left ventricular hypertrophy) due to hypertensive disease, without heart failure 08/15/2018   Obesity (BMI 30-39.9) 05/02/2018   Vitamin D  deficiency 02/03/2018   Stable angina pectoris (HCC) 12/13/2017   Bilateral carotid artery stenosis 12/13/2017   Recurrent major depressive disorder (HCC) 10/17/2017   Mixed hyperlipidemia 12/22/2015   Acute seasonal allergic rhinitis 12/22/2015   Acute maxillary sinusitis 12/22/2015   Incomplete bladder emptying 02/05/2014   Dyslipidemia 08/09/2013   Gastroesophageal reflux disease 08/09/2013   Essential hypertension 08/09/2013   Diabetes mellitus, type 2 (HCC) 08/09/2013   Renal cyst, acquired, left 09/11/2012   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 09/11/2012   Urge incontinence 09/11/2012   Frank hematuria 05/08/2012   Mixed incontinence 05/08/2012   Renal colic 05/08/2012    Allergies  Allergen Reactions   Ace Inhibitors Cough   Corticosteroids     Other reaction(s): OTHER   Nsaids     Other reaction(s): ANAPHYLAXIS    Past Surgical History:  Procedure Laterality Date   CERVICAL FUSION  12/07/2000   COLONOSCOPY  03/22/2004   Dr Felicita Horns   COLONOSCOPY WITH PROPOFOL  N/A 07/30/2020   Procedure: COLONOSCOPY WITH PROPOFOL ;  Surgeon: [name], Alphonsus Jeans, MD;  Location: [facility];  Service: Gastroenterology;  Laterality: N/A;  IDDM   CYSTOSTOMY  09/19/1984   REVERSE SHOULDER ARTHROPLASTY Right 11/30/2022   Procedure: Right reverse shoulder arthroplasty, biceps tenodesis;  Surgeon: Lorri Rota, MD;  Location: [facility] OR[facility];  Service: Orthopedics;  Laterality: Right;   SHOULDER SURGERY Right 11/21/2006   tumor removed   SUBDURAL HEMATOMA EVACUATION VIA CRANIOTOMY  04/16/2007   TONSILECTOMY,  ADENOIDECTOMY, BILATERAL MYRINGOTOMY AND TUBES  03/22/2001   TUMOR REMOVAL     in throat   VAGINAL HYSTERECTOMY  02/23/2000    Social History   Tobacco Use   Smoking status: Former    Current packs/day: 0.25    Average packs/day: 0.3 packs/day for 3.0 years (0.8 ttl pk-yrs)    Types: Cigarettes   Smokeless tobacco: Never  Vaping Use   Vaping status: Never Used  Substance Use Topics   Alcohol use: Not Currently    Alcohol/week: 0.0 standard drinks of alcohol   Drug use: Never     Medication list has been reviewed and updated.  Current Meds  Medication Sig   alendronate  (FOSAMAX ) 70 MG tablet TAKE 1 TABLET BY MOUTH ONCE A WEEK WITH FULL GLASS OF WATER ON AN EMPTY STOMACH   amLODipine  (NORVASC ) 10 MG tablet Take 1 tablet (10 mg total) by mouth daily.   calcium -vitamin D  (OSCAL WITH D) 500-200 MG-UNIT tablet Take 1 tablet by mouth daily.   citalopram  (CELEXA ) 20 MG tablet Take 2 tablets by mouth once daily   citalopram  (CELEXA ) 20 MG tablet Take 2 tablets by mouth once daily   clopidogrel  (PLAVIX ) 75 MG tablet Take 1 tablet (75 mg total) by mouth daily.   Continuous Glucose Receiver (FREESTYLE LIBRE 2 READER) DEVI 1 each.   Continuous Glucose Sensor (FREESTYLE LIBRE 2 PLUS SENSOR) MISC SMARTSIG:1 Each   cromolyn  (NASALCROM ) 5.2 MG/ACT nasal spray Place 1 spray into both nostrils as  needed for allergies.   Cyanocobalamin  1000 MCG TBCR Take 1 tablet by mouth daily.   EQ ALLERGY RELIEF, CETIRIZINE , 10 MG tablet Take 1 tablet by mouth once daily   esomeprazole  (NEXIUM ) 40 MG capsule Take 1 capsule by mouth once daily   gabapentin (NEURONTIN) 300 MG capsule Take 1 capsule by mouth every 6 (six) hours as needed. endo   glucose blood (ACCU-CHEK AVIVA PLUS) test strip USE ONE STRIP TO CHECK GLUCOSE THREE TIMES DAILY AS  DIRECTED   hydrochlorothiazide  (MICROZIDE ) 12.5 MG capsule Take 1 capsule (12.5 mg total) by mouth daily.   insulin  aspart (NOVOLOG ) 100 UNIT/ML injection USE UP TO 60  UNITS PER DAY IN VGO PUMP AS DIRECTED   Insulin  Disposable Pump (V-GO 40) KIT    melatonin 5 MG TABS Take 10 mg by mouth at bedtime.   metoprolol  succinate (TOPROL -XL) 50 MG 24 hr tablet TAKE 1 TABLET BY MOUTH ONCE DAILY TAKE  WITH  OR  IMMEDIATLY  FOLLOWING  A  MEAL   mirabegron  ER (MYRBETRIQ ) 50 MG TB24 tablet Take 1 tablet (50 mg total) by mouth daily.   naproxen (NAPROSYN) 500 MG tablet Take 500 mg by mouth 2 (two) times daily with a meal.   ondansetron  (ZOFRAN ) 4 MG tablet Take 1 tablet (4 mg total) by mouth every 6 (six) hours as needed for nausea.   OZEMPIC, 0.25 OR 0.5 MG/DOSE, 2 MG/1.5ML SOPN Inject into the skin. Weekly   rosuvastatin  (CRESTOR ) 20 MG tablet Take 1 tablet (20 mg total) by mouth daily.   SYNJARDY XR 12.07-998 MG TB24 Take 1 tablet by mouth daily.   TRESIBA FLEXTOUCH 100 UNIT/ML FlexTouch Pen Inject 27 Units into the skin daily.   Vitamin D , Ergocalciferol , (DRISDOL ) 1.25 MG (50000 UNIT) CAPS capsule Take 1 capsule by mouth once a week.       08/16/2023    2:29 PM 03/01/2023    1:37 PM 08/30/2022    1:38 PM 04/19/2022    2:18 PM  GAD 7 : Generalized Anxiety Score  Nervous, Anxious, on Edge 2 0 1 0  Control/stop worrying 3 0 0 0  Worry too much - different things 3 0 0 0  Trouble relaxing 3 0 1 0  Restless 0 0 1 0  Easily annoyed or irritable 3 0 3 0  Afraid - awful might happen 3 0 0 0  Total GAD 7 Score 17 0 6 0  Anxiety Difficulty Extremely difficult Not difficult at all Very difficult Not difficult at all       08/16/2023    2:29 PM 03/01/2023    1:37 PM 09/01/2022    3:38 PM  Depression screen PHQ 2/9  Decreased Interest 2 0 0  Down, Depressed, Hopeless 2 0 0  PHQ - 2 Score 4 0 0  Altered sleeping 3 3 0  Tired, decreased energy 3 3 0  Change in appetite 3 3 0  Feeling bad or failure about yourself  0 0 0  Trouble concentrating 3 0 0  Moving slowly or fidgety/restless 0 0 0  Suicidal thoughts 0 0 0  PHQ-9 Score 16 9 0  Difficult doing work/chores  Extremely dIfficult Not difficult at all Not difficult at all    BP Readings from Last 3 Encounters:  08/16/23 (!) 140/78  03/01/23 114/76  12/01/22 114/65    Physical Exam Vitals and nursing note reviewed.  Constitutional:      General: She is irritable. She is not in acute  distress.    Appearance: She is not diaphoretic.  HENT:     Head: Normocephalic and atraumatic.     Right Ear: Tympanic membrane and external ear normal.     Left Ear: Tympanic membrane and external ear normal.     Nose: Nose normal.     Mouth/Throat:     Mouth: Mucous membranes are moist.  Eyes:     General:        Right eye: No discharge.        Left eye: No discharge.     Conjunctiva/sclera: Conjunctivae normal.     Pupils: Pupils are equal, round, and reactive to light.  Neck:     Thyroid : No thyromegaly.     Vascular: No JVD.  Cardiovascular:     Rate and Rhythm: Normal rate and regular rhythm.     Heart sounds: Normal heart sounds. No murmur heard.    No friction rub. No gallop.  Pulmonary:     Effort: Pulmonary effort is normal.     Breath sounds: Normal breath sounds. No wheezing, rhonchi or rales.  Abdominal:     General: Bowel sounds are normal.     Palpations: Abdomen is soft. There is no mass.     Tenderness: There is no abdominal tenderness. There is no guarding.  Musculoskeletal:        General: Normal range of motion.     Cervical back: Normal range of motion and neck supple.  Lymphadenopathy:     Cervical: No cervical adenopathy.  Skin:    General: Skin is warm and dry.  Neurological:     Mental Status: She is alert.     Deep Tendon Reflexes: Reflexes are normal and symmetric.     Wt Readings from Last 3 Encounters:  08/16/23 208 lb 4 oz (94.5 kg)  03/01/23 201 lb (91.2 kg)  11/30/22 198 lb 4.8 oz (89.9 kg)    BP (!) 140/78   Pulse 76   Ht 5\' 4"  (1.626 m)   Wt 208 lb 4 oz (94.5 kg)   SpO2 94%   BMI 35.75 kg/m   Assessment and Plan:  1. Osteoporosis without  current pathological fracture, unspecified osteoporosis type Chronic.  Controlled.  Stable.  Asymptomatic.  Will continue at least 1 more year Fosamax  70 mg once a week. - alendronate  (FOSAMAX ) 70 MG tablet; Take 1 tablet (70 mg total) by mouth once a week. Take with a full glass of water on an empty stomach.  Dispense: 12 tablet; Refill: 0  2. Essential hypertension Chronic.  Controlled.  Stable.  Blood pressure today 132/78.  Asymptomatic.  Tolerating medications well.  Continue amlodipine  10 mg once a day, hydrochlorothiazide  12.5 mg once a day and metoprolol  XL 1 a day.  Will check blood pressure in 6 months.  Review of previous labs acceptable at this time. - amLODipine  (NORVASC ) 10 MG tablet; Take 1 tablet (10 mg total) by mouth daily.  Dispense: 90 tablet; Refill: 1 - hydrochlorothiazide  (MICROZIDE ) 12.5 MG capsule; Take 1 capsule (12.5 mg total) by mouth daily.  Dispense: 90 capsule; Refill: 1 - metoprolol  succinate (TOPROL -XL) 50 MG 24 hr tablet; TAKE 1 TABLET BY MOUTH ONCE DAILY TAKE  WITH  OR  IMMEDIATLY  FOLLOWING  A  MEAL  Dispense: 90 tablet; Refill: 1  3. Recurrent major depressive disorder, remission status unspecified (HCC) Chronic.  Controlled.  Stable.  PHQ was 16 GAD score is 17 the patient is currently on Celexa  20 mg 2  tablets daily.  Patient was previously followed by Dr. Verdie Gladden and would like to resume therapy.  I have referred to [facility] for evaluation and adjustment of medications - citalopram  (CELEXA ) 20 MG tablet; Take 2 tablets (40 mg total) by mouth daily.  Dispense: 180 tablet; Refill: 1 - Ambulatory referral to Psychiatry  4. Cerebrovascular disease Chronic.  Asymptomatic.  Stable.  Continue clopidogrel  75 mg once.  Will recheck in 6 months. - clopidogrel  (PLAVIX ) 75 MG tablet; Take 1 tablet (75 mg total) by mouth daily.  Dispense: 90 tablet; Refill: 1  5. Seasonal allergic rhinitis due to pollen Chronic.  Controlled.  Stable.  Symptomatic relief continues  with cetirizine  10 mg once a day.  Will recheck patient in 6 months. - cetirizine  (EQ ALLERGY RELIEF, CETIRIZINE ,) 10 MG tablet; Take 1 tablet (10 mg total) by mouth daily.  Dispense: 90 tablet; Refill: 1  6. Gastroesophageal reflux disease Chronic.  Controlled.  Stable.  Patient is asymptomatic without dysphagia.  Continue Nexium  40 mg once a day. - esomeprazole  (NEXIUM ) 40 MG capsule; Take 1 capsule (40 mg total) by mouth daily.  Dispense: 90 capsule; Refill: 1  7. Dyslipidemia Chronic.  Controlled.  Stable.  Asymptomatic.  Without myalgias or muscle weakness.  Continue rosuvastatin  20 mg once a day. - rosuvastatin  (CRESTOR ) 20 MG tablet; Take 1 tablet (20 mg total) by mouth daily.  Dispense: 90 tablet; Refill: 1    Alayne Allis, MD

## 2023-08-16 NOTE — Patient Instructions (Signed)

## 2023-08-18 ENCOUNTER — Ambulatory Visit: Payer: Medicare PPO | Admitting: Family Medicine

## 2023-09-07 ENCOUNTER — Ambulatory Visit: Payer: Medicare PPO | Admitting: Emergency Medicine

## 2023-09-07 VITALS — Ht 64.0 in | Wt 206.0 lb

## 2023-09-07 DIAGNOSIS — Z0000 Encounter for general adult medical examination without abnormal findings: Secondary | ICD-10-CM

## 2023-09-07 DIAGNOSIS — Z1231 Encounter for screening mammogram for malignant neoplasm of breast: Secondary | ICD-10-CM

## 2023-09-07 DIAGNOSIS — Z1159 Encounter for screening for other viral diseases: Secondary | ICD-10-CM

## 2023-09-07 DIAGNOSIS — Z780 Asymptomatic menopausal state: Secondary | ICD-10-CM | POA: Diagnosis not present

## 2023-09-07 NOTE — Patient Instructions (Signed)
 Tracey Russo , Thank you for taking time out of your busy schedule to complete your Annual Wellness Visit with me. I enjoyed our conversation and look forward to speaking with you again next year. I, as well as your care team,  appreciate your ongoing commitment to your health goals. Please review the following plan we discussed and let me know if I can assist you in the future. Your Game plan/ To Do List    Referrals: None  Follow up Visits: Next Medicare AWV with our clinical staff: 09/12/24 @ 2:40pm (PHONE VISIT)   Have you seen your provider in the last 6 months (3 months if uncontrolled diabetes)? Yes Next Office Visit with your provider: 01/26/24 @ 2:00pm with Dr. Altamese Jest  Clinician Recommendations: I have placed an order for a mammogram and bone density scan, please call [phone] to schedule at your earliest convenience.  Aim for 30 minutes of exercise or brisk walking, 6-8 glasses of water, and 5 servings of fruits and vegetables each day.       This is a list of the screening recommended for you and due dates:  Health Maintenance  Topic Date Due   Hepatitis C Screening  Never done   Zoster (Shingles) Vaccine (1 of 2) Never done   Yearly kidney health urinalysis for diabetes  06/09/2022   Mammogram  09/03/2022   COVID-19 Vaccine (5 - 2024-25 season) 11/21/2022   Complete foot exam   02/23/2023   Hemoglobin A1C  05/23/2023   DTaP/Tdap/Td vaccine (2 - Td or Tdap) 07/04/2023   Flu Shot  10/21/2023   Yearly kidney function blood test for diabetes  12/01/2023   Eye exam for diabetics  03/07/2024   Medicare Annual Wellness Visit  09/06/2024   Colon Cancer Screening  07/31/2030   Pneumococcal Vaccine for age over 15  Completed   DEXA scan (bone density measurement)  Completed   HPV Vaccine  Aged Out   Meningitis B Vaccine  Aged Out    Advanced directives: (ACP Link)Information on Advanced Care Planning can be found at [employer] Health Care  Directives Advance Health Care Directives. [email or web address] You may also get the forms at your doctor's office. Advance Care Planning is important because it:  [x]  Makes sure you receive the medical care that is consistent with your values, goals, and preferences  [x]  It provides guidance to your family and loved ones and reduces their decisional burden about whether or not they are making the right decisions based on your wishes.  Follow the link provided in your after visit summary or read over the paperwork we have mailed to you to help you started getting your Advance Directives in place. If you need assistance in completing these, please reach out to us  so that we can help you!  See attachments for Preventive Care and Fall Prevention Tips.   Fall Prevention in the Home, Adult Falls can cause injuries and affect people of all ages. There are many simple things that you can do to make your home safe and to help prevent falls. If you need it, ask for help making these changes. What actions can I take to prevent falls? General information Use good lighting in all rooms. Make sure to: Replace any light bulbs that burn out. Turn on lights if it is dark and use night-lights. Keep items that you use often in easy-to-reach places. Lower the shelves around your home if needed. Move furniture so that there are  clear paths around it. Do not keep throw rugs or other things on the floor that can make you trip. If any of your floors are uneven, fix them. Add color or contrast paint or tape to clearly mark and help you see: Grab bars or handrails. First and last steps of staircases. Where the edge of each step is. If you use a ladder or stepladder: Make sure that it is fully opened. Do not climb a closed ladder. Make sure the sides of the ladder are locked in place. Have someone hold the ladder while you use it. Know where your pets are as you move through your home. What can I do in the bathroom?      Keep the floor dry. Clean up any water that is on the floor right away. Remove soap buildup in the bathtub or shower. Buildup makes bathtubs and showers slippery. Use non-skid mats or decals on the floor of the bathtub or shower. Attach bath mats securely with double-sided, non-slip rug tape. If you need to sit down while you are in the shower, use a non-slip stool. Install grab bars by the toilet and in the bathtub and shower. Do not use towel bars as grab bars. What can I do in the bedroom? Make sure that you have a light by your bed that is easy to reach. Do not use any sheets or blankets on your bed that hang to the floor. Have a firm bench or chair with side arms that you can use for support when you get dressed. What can I do in the kitchen? Clean up any spills right away. If you need to reach something above you, use a sturdy step stool that has a grab bar. Keep electrical cables out of the way. Do not use floor polish or wax that makes floors slippery. What can I do with my stairs? Do not leave anything on the stairs. Make sure that you have a light switch at the top and the bottom of the stairs. Have them installed if you do not have them. Make sure that there are handrails on both sides of the stairs. Fix handrails that are broken or loose. Make sure that handrails are as long as the staircases. Install non-slip stair treads on all stairs in your home if they do not have carpet. Avoid having throw rugs at the top or bottom of stairs, or secure the rugs with carpet tape to prevent them from moving. Choose a carpet design that does not hide the edge of steps on the stairs. Make sure that carpet is firmly attached to the stairs. Fix any carpet that is loose or worn. What can I do on the outside of my home? Use bright outdoor lighting. Repair the edges of walkways and driveways and fix any cracks. Clear paths of anything that can make you trip, such as tools or rocks. Add color or  contrast paint or tape to clearly mark and help you see high doorway thresholds. Trim any bushes or trees on the main path into your home. Check that handrails are securely fastened and in good repair. Both sides of all steps should have handrails. Install guardrails along the edges of any raised decks or porches. Have leaves, snow, and ice cleared regularly. Use sand, salt, or ice melt on walkways during winter months if you live where there is ice and snow. In the garage, clean up any spills right away, including grease or oil spills. What other actions can I  take? Review your medicines with your health care provider. Some medicines can make you confused or feel dizzy. This can increase your chance of falling. Wear closed-toe shoes that fit well and support your feet. Wear shoes that have rubber soles and low heels. Use a cane, walker, scooter, or crutches that help you move around if needed. Talk with your provider about other ways that you can decrease your risk of falls. This may include seeing a physical therapist to learn to do exercises to improve movement and strength. Where to find more information Centers for Disease Control and Prevention, STEAD[employer]: TonerPromos.no [employer] on Aging: BaseRingTones.pl National Institute on Aging: BaseRingTones.pl Contact a health care provider if: You are afraid of falling at home. You feel weak, drowsy, or dizzy at home. You fall at home. Get help right away if you: Lose consciousness or have trouble moving after a fall. Have a fall that causes a head injury. These symptoms may be an emergency. Get help right away. Call 911. Do not wait to see if the symptoms will go away. Do not drive yourself to the hospital. This information is not intended to replace advice given to you by your health care provider. Make sure you discuss any questions you have with your health care provider. Document Revised: 11/09/2021 Document Reviewed: 11/09/2021 Elsevier Patient  Education  2024 [employer].

## 2023-09-07 NOTE — Progress Notes (Signed)
 Subjective:   Tracey Russo is a 72 y.o. who presents for a Medicare Wellness preventive visit.  As a reminder, Annual Wellness Visits don't include a physical exam, and some assessments may be limited, especially if this visit is performed virtually. We may recommend an in-person follow-up visit with your provider if needed.  Visit Complete: Virtual I connected with  Tracey Russo on 09/07/23 by a audio enabled telemedicine application and verified that I am speaking with the correct person using two identifiers.  Patient Location: Home  Provider Location: Home Office  I discussed the limitations of evaluation and management by telemedicine. The patient expressed understanding and agreed to proceed.  Vital Signs: Because this visit was a virtual/telehealth visit, some criteria may be missing or patient reported. Any vitals not documented were not able to be obtained and vitals that have been documented are patient reported.  VideoDeclined- This patient declined Librarian, academic. Therefore the visit was completed with audio only.  Persons Participating in Visit: Patient.  AWV Questionnaire: No: Patient Medicare AWV questionnaire was not completed prior to this visit.  Cardiac Risk Factors include: advanced age (>40men, >69 women);dyslipidemia;hypertension;diabetes mellitus;obesity (BMI >30kg/m2)     Objective:    Today's Vitals   09/07/23 1437 09/07/23 1438  Weight: 206 lb (93.4 kg)   Height: 5' 4 (1.626 m)   PainSc:  6    Body mass index is 35.36 kg/m.     09/07/2023    2:59 PM 11/30/2022    6:10 AM 11/23/2022    1:34 PM 09/01/2022    3:41 PM 05/27/2021    3:27 PM 07/30/2020    7:32 AM 05/26/2020    3:38 PM  Advanced Directives  Does Patient Have a Medical Advance Directive? No No No No No No No  Would patient like information on creating a medical advance directive? Yes (MAU/Ambulatory/Procedural Areas - Information given) No - Patient  declined  No - Patient declined Yes (MAU/Ambulatory/Procedural Areas - Information given)  Yes (MAU/Ambulatory/Procedural Areas - Information given)    Current Medications (verified) Outpatient Encounter Medications as of 09/07/2023  Medication Sig   alendronate  (FOSAMAX ) 70 MG tablet Take 1 tablet (70 mg total) by mouth once a week. Take with a full glass of water on an empty stomach.   amLODipine  (NORVASC ) 10 MG tablet Take 1 tablet (10 mg total) by mouth daily.   calcium -vitamin D  (OSCAL WITH D) 500-200 MG-UNIT tablet Take 1 tablet by mouth daily.   cetirizine  (EQ ALLERGY RELIEF, CETIRIZINE ,) 10 MG tablet Take 1 tablet (10 mg total) by mouth daily.   citalopram  (CELEXA ) 20 MG tablet Take 2 tablets (40 mg total) by mouth daily.   clopidogrel  (PLAVIX ) 75 MG tablet Take 1 tablet (75 mg total) by mouth daily.   Continuous Glucose Receiver (FREESTYLE LIBRE 2 READER) DEVI 1 each.   Continuous Glucose Sensor (FREESTYLE LIBRE 2 PLUS SENSOR) MISC SMARTSIG:1 Each   cromolyn  (NASALCROM ) 5.2 MG/ACT nasal spray Place 1 spray into both nostrils as needed for allergies.   Cyanocobalamin  1000 MCG TBCR Take 1 tablet by mouth daily.   esomeprazole  (NEXIUM ) 40 MG capsule Take 1 capsule (40 mg total) by mouth daily.   gabapentin (NEURONTIN) 300 MG capsule Take 1 capsule by mouth every 6 (six) hours as needed. endo (Patient taking differently: Take 1 capsule by mouth every 6 (six) hours as needed. Endo (taking 2 capsules 4 times per day))   glucose blood (ACCU-CHEK AVIVA PLUS) test strip  USE ONE STRIP TO CHECK GLUCOSE THREE TIMES DAILY AS  DIRECTED   hydrochlorothiazide  (MICROZIDE ) 12.5 MG capsule Take 1 capsule (12.5 mg total) by mouth daily.   insulin  aspart (NOVOLOG ) 100 UNIT/ML injection USE UP TO 60 UNITS PER DAY IN VGO PUMP AS DIRECTED   Insulin  Disposable Pump (V-GO 40) KIT    melatonin 5 MG TABS Take 10 mg by mouth at bedtime.   metoprolol  succinate (TOPROL -XL) 50 MG 24 hr tablet TAKE 1 TABLET BY MOUTH  ONCE DAILY TAKE  WITH  OR  IMMEDIATLY  FOLLOWING  A  MEAL   mirabegron  ER (MYRBETRIQ ) 50 MG TB24 tablet Take 1 tablet (50 mg total) by mouth daily.   naproxen (NAPROSYN) 500 MG tablet Take 500 mg by mouth 2 (two) times daily with a meal.   ondansetron  (ZOFRAN ) 4 MG tablet Take 1 tablet (4 mg total) by mouth every 6 (six) hours as needed for nausea.   OZEMPIC, 0.25 OR 0.5 MG/DOSE, 2 MG/1.5ML SOPN Inject into the skin. Weekly   rosuvastatin  (CRESTOR ) 20 MG tablet Take 1 tablet (20 mg total) by mouth daily.   TRESIBA FLEXTOUCH 100 UNIT/ML FlexTouch Pen Inject 27 Units into the skin daily.   Vitamin D , Ergocalciferol , (DRISDOL ) 1.25 MG (50000 UNIT) CAPS capsule Take 1 capsule by mouth once a week.   citalopram  (CELEXA ) 20 MG tablet Take 2 tablets by mouth once daily (Patient not taking: Reported on 09/07/2023)   SYNJARDY XR 12.07-998 MG TB24 Take 1 tablet by mouth daily. (Patient not taking: Reported on 09/07/2023)   No facility-administered encounter medications on file as of 09/07/2023.    Allergies (verified) Ace inhibitors, Corticosteroids, and Nsaids   History: Past Medical History:  Diagnosis Date   Allergy    Arthritis    Bilateral carotid artery stenosis    Cancer (HCC)    pre leukemia   Complication of anesthesia    had TIA after neck surgery   Depression    Diabetes mellitus without complication (HCC)    Family history of adverse reaction to anesthesia    father has CVA after bypass surgery   GERD (gastroesophageal reflux disease)    History of kidney stones    Hyperlipidemia    Hypertension    LVH (left ventricular hypertrophy) due to hypertensive disease, without heart failure    Migraines    Neuropathy due to type 2 diabetes mellitus (HCC)    OSA (obstructive sleep apnea)    Polycythemia    Rotator cuff arthropathy of right shoulder    Stroke (HCC)    after recovery had a TIA   Vitamin D  deficiency    Past Surgical History:  Procedure Laterality Date   CERVICAL  FUSION  12/07/2000   COLONOSCOPY  03/22/2004   Dr Felicita Horns   COLONOSCOPY WITH PROPOFOL  N/A 07/30/2020   Procedure: COLONOSCOPY WITH PROPOFOL ;  Surgeon: [name], Alphonsus Jeans, MD;  Location: [facility] ENDOSCOPY;  Service: Gastroenterology;  Laterality: N/A;  IDDM   CYSTOSTOMY  09/19/1984   REVERSE SHOULDER ARTHROPLASTY Right 11/30/2022   Procedure: Right reverse shoulder arthroplasty, biceps tenodesis;  Surgeon: Lorri Rota, MD;  Location: [facility] ORS;  Service: Orthopedics;  Laterality: Right;   SHOULDER SURGERY Right 11/21/2006   tumor removed   SUBDURAL HEMATOMA EVACUATION VIA CRANIOTOMY  04/16/2007   TONSILECTOMY, ADENOIDECTOMY, BILATERAL MYRINGOTOMY AND TUBES  03/22/2001   TUMOR REMOVAL     in throat   VAGINAL HYSTERECTOMY  02/23/2000   Family History  Problem Relation Age of Onset  Diabetes Mother    Leukemia Father    Goiter Sister    Leukemia Paternal Grandfather    Social History   Socioeconomic History   Marital status: Married    Spouse name: Dorothy Polhemus   Number of children: 4   Years of education: Not on file   Highest education level: Not on file  Occupational History    Comment: retired  Tobacco Use   Smoking status: Former    Current packs/day: 0.00    Average packs/day: 0.3 packs/day for 44.0 years (11.0 ttl pk-yrs)    Types: Cigarettes    Start date: 65    Quit date: 10-03-13    Years since quitting: 10.4   Smokeless tobacco: Never  Vaping Use   Vaping status: Never Used  Substance and Sexual Activity   Alcohol use: Not Currently    Alcohol/week: 0.0 standard drinks of alcohol   Drug use: Never   Sexual activity: Yes    Partners: Male  Other Topics Concern   Not on file  Social History Narrative   09/30/2023 4 children, 1 deceased 04-Oct-2022 and 3 living/pbt   Social Drivers of Health   Financial Resource Strain: Low Risk  (September 30, 2023)   Overall Financial Resource Strain (CARDIA)    Difficulty of Paying Living Expenses: Not hard at all  Food Insecurity: No  Food Insecurity (2023-09-30)   Hunger Vital Sign    Worried About Running Out of Food in the Last Year: Never true    Ran Out of Food in the Last Year: Never true  Transportation Needs: No Transportation Needs (2023-09-30)   PRAPARE - Administrator, Civil Service (Medical): No    Lack of Transportation (Non-Medical): No  Physical Activity: Insufficiently Active (2023-09-30)   Exercise Vital Sign    Days of Exercise per Week: 7 days    Minutes of Exercise per Session: 20 min  Stress: No Stress Concern Present (2023-09-30)   [employer] of Occupational Health - Occupational Stress Questionnaire    Feeling of Stress: Only a little  Social Connections: Moderately Isolated (09-30-2023)   Social Connection and Isolation Panel    Frequency of Communication with Friends and Family: More than three times a week    Frequency of Social Gatherings with Friends and Family: More than three times a week    Attends Religious Services: Never    Database administrator or Organizations: No    Attends Engineer, structural: Never    Marital Status: Married    Tobacco Counseling Counseling given: No    Clinical Intake:  Pre-visit preparation completed: Yes  Pain : 0-10 Pain Score: 6  Pain Type: Neuropathic pain Pain Location: Foot Pain Orientation: Left, Right Pain Descriptors / Indicators: Tingling     BMI - recorded: 35.36 Nutritional Status: BMI > 30  Obese Nutritional Risks: None Diabetes: Yes CBG done?: No (FBS 98 per patient) Did pt. bring in CBG monitor from home?: No  Lab Results  Component Value Date   HGBA1C 7.8 (H) 11/23/2022   HGBA1C 7.5 02/22/2022   HGBA1C 9.7 10/14/2020     How often do you need to have someone help you when you read instructions, pamphlets, or other written materials from your doctor or pharmacy?: 1 - Never  Interpreter Needed?: No  Information entered by :: Jaunita Messier, CMA   Activities of Daily Living     09/30/2023     2:42 PM 11/30/2022   10:00 AM  In  your present state of health, do you have any difficulty performing the following activities:  Hearing? 0 0  Vision? 0 1  Difficulty concentrating or making decisions? 1 0  Comment concentrating, but not often   Walking or climbing stairs? 1 1  Comment due to neuropathy   Dressing or bathing? 0 0  Doing errands, shopping? 1 0  Comment doesn't drive, husband takes to appointments   Preparing Food and eating ? N   Using the Toilet? N   In the past six months, have you accidently leaked urine? Y   Comment wears always underwear   Do you have problems with loss of bowel control? Y   Comment wears always underwear   Managing your Medications? N   Managing your Finances? N   Housekeeping or managing your Housekeeping? N     Patient Care Team: Kotturi, Vinay K, MD as PCP - General (Family Medicine) Rosa College, MD as Consulting Physician (Ophthalmology) Berton Brock, MD as Consulting Physician (Endocrinology) Lorri Rota, MD as Consulting Physician (Orthopedic Surgery)  I have updated your Care Teams any recent Medical Services you may have received from other providers in the past year.     Assessment:   This is a routine wellness examination for [place].  Hearing/Vision screen Hearing Screening - Comments:: Denies hearing loss Vision Screening - Comments:: Gets DM eye exam, Dr. Dusty Gin, [place]   Goals Addressed             This Visit's Progress    Patient Stated       Lose 50 lbs       Depression Screen     09/07/2023    2:54 PM 08/16/2023    2:29 PM 03/01/2023    1:37 PM 09/01/2022    3:38 PM 08/30/2022    1:37 PM 04/19/2022    2:18 PM 02/26/2022    1:45 PM  PHQ 2/9 Scores  PHQ - 2 Score 2 4 0 0 4 0 1  PHQ- 9 Score 15 16 9  0 15 0 1    Fall Risk     09/07/2023    3:00 PM 08/16/2023    2:30 PM 03/01/2023    1:37 PM 09/01/2022    3:41 PM 08/30/2022    1:37 PM  Fall Risk   Falls in the past year? 1 1 0  0 0  Number falls in past yr: 0  0 0 0  Injury with Fall? 0 0 0 0 0  Risk for fall due to : History of fall(s);Impaired balance/gait;Orthopedic patient  No Fall Risks No Fall Risks No Fall Risks;Impaired balance/gait  Follow up Falls evaluation completed;Education provided  Falls evaluation completed Falls prevention discussed;Falls evaluation completed Falls evaluation completed;Falls prevention discussed    MEDICARE RISK AT HOME:  Medicare Risk at Home Any stairs in or around the home?: Yes If so, are there any without handrails?: No Home free of loose throw rugs in walkways, pet beds, electrical cords, etc?: Yes Adequate lighting in your home to reduce risk of falls?: Yes Life alert?: No Use of a cane, walker or w/c?: Yes (cane when needed) Grab bars in the bathroom?: Yes Shower chair or bench in shower?: Yes Elevated toilet seat or a handicapped toilet?: Yes  TIMED UP AND GO:  Was the test performed?  No  Cognitive Function: 6CIT completed        09/07/2023    3:01 PM 09/01/2022    3:50 PM 05/23/2019  4:04 PM  6CIT Screen  What Year? 0 points 0 points 0 points  What month? 0 points 0 points 0 points  What time? 0 points 0 points 0 points  Count back from 20 0 points 0 points 0 points  Months in reverse 0 points 0 points 0 points  Repeat phrase 0 points 0 points 0 points  Total Score 0 points 0 points 0 points    Immunizations Immunization History  Administered Date(s) Administered   Fluad Quad(high Dose 65+) 12/07/2018, 01/02/2020, 12/09/2020, 02/26/2022   Fluad Trivalent(High Dose 65+) 01/13/2023   Influenza, High Dose Seasonal PF 12/06/2017   Influenza,inj,Quad PF,6+ Mos 05/09/2015, 12/22/2015, 06/02/2017   PFIZER(Purple Top)SARS-COV-2 Vaccination 05/07/2019, 06/05/2019, 02/28/2020   Pfizer(Comirnaty)Fall Seasonal Vaccine 12 years and older 02/26/2022   Pneumococcal Conjugate-13 06/02/2017   Pneumococcal Polysaccharide-23 12/07/2018   Tdap 07/03/2013     Screening Tests Health Maintenance  Topic Date Due   Hepatitis C Screening  Never done   Zoster Vaccines- Shingrix (1 of 2) Never done   Diabetic kidney evaluation - Urine ACR  06/09/2022   MAMMOGRAM  09/03/2022   COVID-19 Vaccine (5 - 2024-25 season) 11/21/2022   FOOT EXAM  02/23/2023   HEMOGLOBIN A1C  05/23/2023   DTaP/Tdap/Td (2 - Td or Tdap) 07/04/2023   INFLUENZA VACCINE  10/21/2023   Diabetic kidney evaluation - eGFR measurement  12/01/2023   OPHTHALMOLOGY EXAM  03/07/2024   Medicare Annual Wellness (AWV)  09/06/2024   Colonoscopy  07/31/2030   Pneumococcal Vaccine: 50+ Years  Completed   DEXA SCAN  Completed   HPV VACCINES  Aged Out   Meningococcal B Vaccine  Aged Out    Health Maintenance  Health Maintenance Due  Topic Date Due   Hepatitis C Screening  Never done   Zoster Vaccines- Shingrix (1 of 2) Never done   Diabetic kidney evaluation - Urine ACR  06/09/2022   MAMMOGRAM  09/03/2022   COVID-19 Vaccine (5 - 2024-25 season) 11/21/2022   FOOT EXAM  02/23/2023   HEMOGLOBIN A1C  05/23/2023   DTaP/Tdap/Td (2 - Td or Tdap) 07/04/2023   Health Maintenance Items Addressed: Mammogram ordered, DEXA ordered, Labs Ordered: Hep C Screening, See Nurse Notes at the end of this note  Additional Screening:  Vision Screening: Recommended annual ophthalmology exams for early detection of glaucoma and other disorders of the eye. Would you like a referral to an eye doctor? No    Dental Screening: Recommended annual dental exams for proper oral hygiene  Community Resource Referral / Chronic Care Management: CRR required this visit?  No   CCM required this visit?  No   Plan:    I have personally reviewed and noted the following in the patient's chart:   Medical and social history Use of alcohol, tobacco or illicit drugs  Current medications and supplements including opioid prescriptions. Patient is not currently taking opioid prescriptions. Functional ability and  status Nutritional status Physical activity Advanced directives List of other physicians Hospitalizations, surgeries, and ER visits in previous 12 months Vitals Screenings to include cognitive, depression, and falls Referrals and appointments  In addition, I have reviewed and discussed with patient certain preventive protocols, quality metrics, and best practice recommendations. A written personalized care plan for preventive services as well as general preventive health recommendations were provided to patient.   Jaunita Messier, CMA   09/07/2023   After Visit Summary: (MyChart) Due to this being a telephonic visit, the after visit summary with patients personalized plan  was offered to patient via MyChart   Notes:  FBS this morning 98 per patient Placed orders for MMG and DEXA scan Needs Tdap (pharmacy) Declined DM & Nutrition Education referral Declined shingles and covid vaccines Need DM foot exam at next OV on 01/26/24 (patient also sees Endocrinology) Needs Hep C Screening to be done with next blood work (order placed)

## 2023-11-25 DIAGNOSIS — Z794 Long term (current) use of insulin: Secondary | ICD-10-CM | POA: Diagnosis not present

## 2023-11-25 DIAGNOSIS — E1142 Type 2 diabetes mellitus with diabetic polyneuropathy: Secondary | ICD-10-CM | POA: Diagnosis not present

## 2023-11-25 DIAGNOSIS — Z1331 Encounter for screening for depression: Secondary | ICD-10-CM | POA: Diagnosis not present

## 2023-11-25 LAB — HEMOGLOBIN A1C: Hemoglobin A1C: 7.5

## 2023-12-12 DIAGNOSIS — G4733 Obstructive sleep apnea (adult) (pediatric): Secondary | ICD-10-CM | POA: Diagnosis not present

## 2024-01-11 ENCOUNTER — Other Ambulatory Visit: Payer: Self-pay

## 2024-01-11 DIAGNOSIS — J301 Allergic rhinitis due to pollen: Secondary | ICD-10-CM

## 2024-01-11 MED ORDER — CETIRIZINE HCL 10 MG PO TABS
10.0000 mg | ORAL_TABLET | Freq: Every day | ORAL | 1 refills | Status: AC
Start: 2024-01-11 — End: ?

## 2024-01-26 ENCOUNTER — Ambulatory Visit: Admitting: Family Medicine

## 2024-01-26 ENCOUNTER — Encounter: Payer: Self-pay | Admitting: Family Medicine

## 2024-01-26 VITALS — BP 128/82 | HR 70 | Ht 64.0 in | Wt 215.0 lb

## 2024-01-26 DIAGNOSIS — G629 Polyneuropathy, unspecified: Secondary | ICD-10-CM

## 2024-01-26 DIAGNOSIS — Z794 Long term (current) use of insulin: Secondary | ICD-10-CM

## 2024-01-26 DIAGNOSIS — I10 Essential (primary) hypertension: Secondary | ICD-10-CM

## 2024-01-26 DIAGNOSIS — E1142 Type 2 diabetes mellitus with diabetic polyneuropathy: Secondary | ICD-10-CM

## 2024-01-26 DIAGNOSIS — E785 Hyperlipidemia, unspecified: Secondary | ICD-10-CM | POA: Diagnosis not present

## 2024-01-26 DIAGNOSIS — R3129 Other microscopic hematuria: Secondary | ICD-10-CM

## 2024-01-26 DIAGNOSIS — Z130 Encounter for screening for diseases of the blood and blood-forming organs and certain disorders involving the immune mechanism: Secondary | ICD-10-CM | POA: Diagnosis not present

## 2024-01-26 DIAGNOSIS — Z79899 Other long term (current) drug therapy: Secondary | ICD-10-CM

## 2024-01-26 DIAGNOSIS — N3946 Mixed incontinence: Secondary | ICD-10-CM | POA: Diagnosis not present

## 2024-01-26 DIAGNOSIS — I00 Rheumatic fever without heart involvement: Secondary | ICD-10-CM

## 2024-01-26 DIAGNOSIS — E559 Vitamin D deficiency, unspecified: Secondary | ICD-10-CM

## 2024-01-26 DIAGNOSIS — Z1231 Encounter for screening mammogram for malignant neoplasm of breast: Secondary | ICD-10-CM

## 2024-01-26 DIAGNOSIS — F3342 Major depressive disorder, recurrent, in full remission: Secondary | ICD-10-CM

## 2024-01-26 MED ORDER — AMLODIPINE-OLMESARTAN 5-20 MG PO TABS: 1.0000 | ORAL_TABLET | Freq: Every day | ORAL | 0 refills | Status: DC

## 2024-01-26 NOTE — Progress Notes (Signed)
   Established Patient Office Visit  Patient ID: Tracey Russo, female    DOB: 1950/07/03  Age: 73 y.o. MRN: [id number] PCP: Mischa Pollard K, MD  No chief complaint on file.   Subjective:     HPI  Discussed the use of AI scribe software for clinical note transcription with the patient, who gave verbal consent to proceed.  History of Present Illness    {History (Optional):23778}  ROS    Objective:     Ht 5' 4 (1.626 m)   BMI 35.36 kg/m  {Vitals History (Optional):23777}  Physical Exam  {PhysExam Abridge (Optional):210964309} No results found for any visits on 01/26/24.  {Labs (Optional):23779}  The ASCVD Risk score (Arnett DK, et al., 2019) failed to calculate for the following reasons:   Risk score cannot be calculated because patient has a medical history suggesting prior/existing ASCVD    Assessment & Plan:   Problem List Items Addressed This Visit   None   Assessment and Plan Assessment & Plan     No follow-ups on file.    Vinary K Darenda Fike, MD [facility] at [facility]

## 2024-01-26 NOTE — Patient Instructions (Signed)
 Call [facility] to schedule your mammogram at [phone].

## 2024-01-27 ENCOUNTER — Ambulatory Visit: Payer: Self-pay | Admitting: Family Medicine

## 2024-01-27 LAB — URINALYSIS
Bilirubin, UA: NEGATIVE
Ketones, UA: NEGATIVE
Leukocytes,UA: NEGATIVE
Nitrite, UA: NEGATIVE
RBC, UA: NEGATIVE
Specific Gravity, UA: 1.027 (ref 1.005–1.030)
Urobilinogen, Ur: 0.2 mg/dL (ref 0.2–1.0)
pH, UA: 6 (ref 5.0–7.5)

## 2024-01-27 LAB — CBC WITH DIFFERENTIAL/PLATELET
Basophils Absolute: 0.1 x10E3/uL (ref 0.0–0.2)
Basos: 1 %
EOS (ABSOLUTE): 0.3 x10E3/uL (ref 0.0–0.4)
Eos: 4 %
Hematocrit: 45.7 % (ref 34.0–46.6)
Hemoglobin: 15.1 g/dL (ref 11.1–15.9)
Immature Grans (Abs): 0 x10E3/uL (ref 0.0–0.1)
Immature Granulocytes: 0 %
Lymphocytes Absolute: 3.4 x10E3/uL — ABNORMAL HIGH (ref 0.7–3.1)
Lymphs: 39 %
MCH: 30 pg (ref 26.6–33.0)
MCHC: 33 g/dL (ref 31.5–35.7)
MCV: 91 fL (ref 79–97)
Monocytes Absolute: 0.8 x10E3/uL (ref 0.1–0.9)
Monocytes: 9 %
Neutrophils Absolute: 4.2 x10E3/uL (ref 1.4–7.0)
Neutrophils: 47 %
Platelets: 217 x10E3/uL (ref 150–450)
RBC: 5.03 x10E6/uL (ref 3.77–5.28)
RDW: 12.7 % (ref 11.7–15.4)
WBC: 8.7 x10E3/uL (ref 3.4–10.8)

## 2024-01-27 LAB — COMPREHENSIVE METABOLIC PANEL WITH GFR
ALT: 14 IU/L (ref 0–32)
AST: 15 IU/L (ref 0–40)
Albumin: 4.5 g/dL (ref 3.8–4.8)
Alkaline Phosphatase: 73 IU/L (ref 49–135)
BUN/Creatinine Ratio: 16 (ref 12–28)
BUN: 14 mg/dL (ref 8–27)
Bilirubin Total: 0.5 mg/dL (ref 0.0–1.2)
CO2: 23 mmol/L (ref 20–29)
Calcium: 10.1 mg/dL (ref 8.7–10.3)
Chloride: 101 mmol/L (ref 96–106)
Creatinine, Ser: 0.88 mg/dL (ref 0.57–1.00)
Globulin, Total: 3 g/dL (ref 1.5–4.5)
Glucose: 83 mg/dL (ref 70–99)
Potassium: 4.2 mmol/L (ref 3.5–5.2)
Sodium: 141 mmol/L (ref 134–144)
Total Protein: 7.5 g/dL (ref 6.0–8.5)
eGFR: 68 mL/min/1.73 (ref >59)

## 2024-01-27 LAB — VITAMIN D 25 HYDROXY (VIT D DEFICIENCY, FRACTURES): Vit D, 25-Hydroxy: 46.1 ng/mL (ref 30.0–100.0)

## 2024-01-27 LAB — VITAMIN B12: Vitamin B-12: 617 pg/mL (ref 232–1245)

## 2024-01-27 LAB — FOLATE: Folate: 15.9 ng/mL (ref >3.0)

## 2024-01-27 NOTE — Progress Notes (Signed)
 Hello overall your labs look good. Vitamin D  and B12 and folic are in normal range. Blood count is normal.  There is some protein and sugars in urine which is due to diabetes. I  changed your blood pressure medicine during the visit has renal protection so it will reduce protein loss in urine.

## 2024-02-27 ENCOUNTER — Encounter: Payer: Self-pay | Admitting: Family Medicine

## 2024-03-01 ENCOUNTER — Ambulatory Visit: Admitting: Family Medicine

## 2024-03-07 ENCOUNTER — Ambulatory Visit

## 2024-03-08 ENCOUNTER — Other Ambulatory Visit: Payer: Self-pay

## 2024-03-08 DIAGNOSIS — I10 Essential (primary) hypertension: Secondary | ICD-10-CM

## 2024-03-08 MED ORDER — METOPROLOL SUCCINATE ER 50 MG PO TB24: ORAL_TABLET | ORAL | 1 refills | Status: AC

## 2024-03-19 ENCOUNTER — Other Ambulatory Visit: Payer: Self-pay | Admitting: Family Medicine

## 2024-03-19 DIAGNOSIS — I10 Essential (primary) hypertension: Secondary | ICD-10-CM

## 2024-03-20 LAB — OPHTHALMOLOGY REPORT-SCANNED

## 2024-03-20 NOTE — Telephone Encounter (Signed)
 Requested Prescriptions  Pending Prescriptions Disp Refills   amLODipine -olmesartan  (AZOR ) 5-20 MG tablet [Pharmacy Med Name: amLODIPine -Olmesartan  5-20 MG Oral Tablet] 90 tablet 1    Sig: Take 1 tablet by mouth once daily     Cardiovascular: CCB + ARB Combos Passed - 03/20/2024  3:42 PM      Passed - K in normal range and within 180 days    Potassium  Date Value Ref Range Status  01/26/2024 4.2 3.5 - 5.2 mmol/L Final         Passed - Cr in normal range and within 180 days    Creatinine  Date Value Ref Range Status  05/15/2012 0.89 0.60 - 1.30 mg/dL Final   Creatinine, Ser  Date Value Ref Range Status  01/26/2024 0.88 0.57 - 1.00 mg/dL Final         Passed - Na in normal range and within 180 days    Sodium  Date Value Ref Range Status  01/26/2024 141 134 - 144 mmol/L Final         Passed - Patient is not pregnant      Passed - Last BP in normal range    BP Readings from Last 1 Encounters:  01/26/24 128/82         Passed - Valid encounter within last 6 months    Recent Outpatient Visits           1 month ago Essential hypertension   [facility] Kotturi, Vinay K, MD   7 months ago Osteoporosis without current pathological fracture, unspecified osteoporosis type   [facility] at [facility] [place], Deanna C, MD

## 2024-07-26 ENCOUNTER — Ambulatory Visit: Admitting: Family Medicine

## 2024-09-12 ENCOUNTER — Ambulatory Visit
